# Patient Record
Sex: Female | Born: 1960 | Race: White | Hispanic: No | Marital: Married | Smoking: Former smoker
Health system: Southern US, Community
[De-identification: ages and names within clinical notes are randomized; demographics above are authoritative.]

## PROBLEM LIST (undated history)

## (undated) DIAGNOSIS — Z808 Family history of malignant neoplasm of other organs or systems: Secondary | ICD-10-CM

## (undated) DIAGNOSIS — M8580 Other specified disorders of bone density and structure, unspecified site: Secondary | ICD-10-CM

## (undated) DIAGNOSIS — K279 Peptic ulcer, site unspecified, unspecified as acute or chronic, without hemorrhage or perforation: Secondary | ICD-10-CM

## (undated) DIAGNOSIS — K219 Gastro-esophageal reflux disease without esophagitis: Secondary | ICD-10-CM

## (undated) DIAGNOSIS — Z720 Tobacco use: Secondary | ICD-10-CM

## (undated) DIAGNOSIS — Z87442 Personal history of urinary calculi: Secondary | ICD-10-CM

## (undated) DIAGNOSIS — T7840XA Allergy, unspecified, initial encounter: Secondary | ICD-10-CM

## (undated) DIAGNOSIS — H539 Unspecified visual disturbance: Secondary | ICD-10-CM

## (undated) HISTORY — DX: Allergy, unspecified, initial encounter: T78.40XA

## (undated) HISTORY — DX: Other specified disorders of bone density and structure, unspecified site: M85.80

## (undated) HISTORY — DX: Personal history of urinary calculi: Z87.442

## (undated) HISTORY — DX: Unspecified visual disturbance: H53.9

## (undated) HISTORY — DX: Family history of malignant neoplasm of other organs or systems: Z80.8

---

## 1994-07-26 DIAGNOSIS — Z87442 Personal history of urinary calculi: Secondary | ICD-10-CM

## 1994-07-26 HISTORY — DX: Personal history of urinary calculi: Z87.442

## 1996-07-26 HISTORY — PX: OTHER SURGICAL HISTORY: SHX169

## 2002-07-05 ENCOUNTER — Other Ambulatory Visit: Admission: RE | Admit: 2002-07-05 | Discharge: 2002-07-05 | Payer: Self-pay | Admitting: Internal Medicine

## 2002-09-12 ENCOUNTER — Ambulatory Visit (HOSPITAL_COMMUNITY): Admission: RE | Admit: 2002-09-12 | Discharge: 2002-09-12 | Payer: Self-pay | Admitting: Internal Medicine

## 2002-09-12 ENCOUNTER — Encounter (INDEPENDENT_AMBULATORY_CARE_PROVIDER_SITE_OTHER): Payer: Self-pay | Admitting: *Deleted

## 2002-09-12 LAB — HM COLONOSCOPY

## 2003-01-15 ENCOUNTER — Ambulatory Visit (HOSPITAL_COMMUNITY): Admission: RE | Admit: 2003-01-15 | Discharge: 2003-01-15 | Payer: Self-pay | Admitting: Obstetrics and Gynecology

## 2003-01-15 ENCOUNTER — Encounter: Payer: Self-pay | Admitting: Obstetrics and Gynecology

## 2004-04-25 ENCOUNTER — Emergency Department (HOSPITAL_COMMUNITY): Admission: EM | Admit: 2004-04-25 | Discharge: 2004-04-25 | Payer: Self-pay

## 2004-05-15 ENCOUNTER — Other Ambulatory Visit: Admission: RE | Admit: 2004-05-15 | Discharge: 2004-05-15 | Payer: Self-pay | Admitting: Obstetrics and Gynecology

## 2004-05-28 ENCOUNTER — Encounter: Admission: RE | Admit: 2004-05-28 | Discharge: 2004-05-28 | Payer: Self-pay | Admitting: Internal Medicine

## 2004-05-28 ENCOUNTER — Ambulatory Visit: Payer: Self-pay | Admitting: Internal Medicine

## 2004-07-01 ENCOUNTER — Ambulatory Visit: Payer: Self-pay | Admitting: Internal Medicine

## 2004-09-24 ENCOUNTER — Ambulatory Visit: Payer: Self-pay | Admitting: Internal Medicine

## 2004-10-07 ENCOUNTER — Ambulatory Visit: Payer: Self-pay | Admitting: Internal Medicine

## 2005-04-14 ENCOUNTER — Ambulatory Visit: Payer: Self-pay | Admitting: Internal Medicine

## 2005-06-09 ENCOUNTER — Ambulatory Visit: Payer: Self-pay | Admitting: Internal Medicine

## 2005-08-25 ENCOUNTER — Other Ambulatory Visit: Admission: RE | Admit: 2005-08-25 | Discharge: 2005-08-25 | Payer: Self-pay | Admitting: Obstetrics and Gynecology

## 2005-09-14 ENCOUNTER — Ambulatory Visit (HOSPITAL_COMMUNITY): Admission: RE | Admit: 2005-09-14 | Discharge: 2005-09-14 | Payer: Self-pay | Admitting: Obstetrics and Gynecology

## 2006-02-08 ENCOUNTER — Ambulatory Visit: Payer: Self-pay | Admitting: Internal Medicine

## 2006-02-22 ENCOUNTER — Ambulatory Visit: Payer: Self-pay | Admitting: Internal Medicine

## 2006-05-18 ENCOUNTER — Ambulatory Visit: Payer: Self-pay | Admitting: Internal Medicine

## 2006-09-02 ENCOUNTER — Ambulatory Visit: Payer: Self-pay | Admitting: Internal Medicine

## 2006-10-18 ENCOUNTER — Encounter: Admission: RE | Admit: 2006-10-18 | Discharge: 2006-10-18 | Payer: Self-pay | Admitting: Obstetrics and Gynecology

## 2006-12-02 ENCOUNTER — Ambulatory Visit: Payer: Self-pay | Admitting: Internal Medicine

## 2007-01-06 ENCOUNTER — Ambulatory Visit: Payer: Self-pay | Admitting: Internal Medicine

## 2007-01-09 DIAGNOSIS — IMO0002 Reserved for concepts with insufficient information to code with codable children: Secondary | ICD-10-CM | POA: Insufficient documentation

## 2007-01-09 DIAGNOSIS — J302 Other seasonal allergic rhinitis: Secondary | ICD-10-CM | POA: Insufficient documentation

## 2007-01-09 DIAGNOSIS — J3089 Other allergic rhinitis: Secondary | ICD-10-CM

## 2007-01-09 DIAGNOSIS — Z87442 Personal history of urinary calculi: Secondary | ICD-10-CM | POA: Insufficient documentation

## 2007-01-09 DIAGNOSIS — O1410 Severe pre-eclampsia, unspecified trimester: Secondary | ICD-10-CM | POA: Insufficient documentation

## 2007-01-18 ENCOUNTER — Ambulatory Visit: Payer: Self-pay | Admitting: Internal Medicine

## 2007-05-02 ENCOUNTER — Ambulatory Visit: Payer: Self-pay | Admitting: Internal Medicine

## 2007-07-04 ENCOUNTER — Ambulatory Visit: Payer: Self-pay | Admitting: Internal Medicine

## 2007-08-31 ENCOUNTER — Ambulatory Visit: Payer: Self-pay | Admitting: Internal Medicine

## 2007-10-04 ENCOUNTER — Ambulatory Visit: Payer: Self-pay | Admitting: Internal Medicine

## 2007-10-24 ENCOUNTER — Ambulatory Visit: Payer: Self-pay | Admitting: Internal Medicine

## 2007-10-27 ENCOUNTER — Telehealth: Payer: Self-pay | Admitting: Internal Medicine

## 2007-10-27 ENCOUNTER — Ambulatory Visit: Payer: Self-pay | Admitting: Internal Medicine

## 2007-12-13 ENCOUNTER — Encounter: Admission: RE | Admit: 2007-12-13 | Discharge: 2007-12-13 | Payer: Self-pay | Admitting: Obstetrics and Gynecology

## 2008-02-06 ENCOUNTER — Ambulatory Visit: Payer: Self-pay | Admitting: Internal Medicine

## 2008-02-14 ENCOUNTER — Encounter: Admission: RE | Admit: 2008-02-14 | Discharge: 2008-02-14 | Payer: Self-pay | Admitting: Obstetrics and Gynecology

## 2008-04-17 ENCOUNTER — Ambulatory Visit: Payer: Self-pay | Admitting: Family Medicine

## 2008-04-25 ENCOUNTER — Ambulatory Visit: Payer: Self-pay | Admitting: Internal Medicine

## 2008-04-25 DIAGNOSIS — I7300 Raynaud's syndrome without gangrene: Secondary | ICD-10-CM | POA: Insufficient documentation

## 2008-05-08 ENCOUNTER — Telehealth: Payer: Self-pay | Admitting: *Deleted

## 2008-06-04 ENCOUNTER — Telehealth: Payer: Self-pay | Admitting: Internal Medicine

## 2008-08-09 ENCOUNTER — Ambulatory Visit: Payer: Self-pay | Admitting: Internal Medicine

## 2008-08-09 LAB — CONVERTED CEMR LAB: Rapid Strep: NEGATIVE

## 2008-08-10 ENCOUNTER — Emergency Department (HOSPITAL_COMMUNITY): Admission: EM | Admit: 2008-08-10 | Discharge: 2008-08-10 | Payer: Self-pay | Admitting: Emergency Medicine

## 2008-08-19 ENCOUNTER — Ambulatory Visit: Payer: Self-pay | Admitting: Internal Medicine

## 2008-08-21 ENCOUNTER — Ambulatory Visit: Payer: Self-pay | Admitting: Internal Medicine

## 2008-08-23 ENCOUNTER — Encounter: Payer: Self-pay | Admitting: Internal Medicine

## 2008-12-30 ENCOUNTER — Ambulatory Visit: Payer: Self-pay | Admitting: Internal Medicine

## 2009-05-08 ENCOUNTER — Ambulatory Visit: Payer: Self-pay | Admitting: Internal Medicine

## 2009-07-07 ENCOUNTER — Ambulatory Visit: Payer: Self-pay | Admitting: Family Medicine

## 2009-07-07 LAB — CONVERTED CEMR LAB: Rapid Strep: POSITIVE

## 2009-09-11 ENCOUNTER — Ambulatory Visit: Payer: Self-pay | Admitting: Internal Medicine

## 2009-09-11 LAB — CONVERTED CEMR LAB: Hgb A1c MFr Bld: 5.5 % (ref 4.6–6.5)

## 2009-12-05 ENCOUNTER — Ambulatory Visit: Payer: Self-pay | Admitting: Family Medicine

## 2009-12-16 ENCOUNTER — Encounter: Payer: Self-pay | Admitting: Internal Medicine

## 2009-12-17 ENCOUNTER — Ambulatory Visit: Payer: Self-pay | Admitting: Internal Medicine

## 2009-12-18 ENCOUNTER — Encounter: Payer: Self-pay | Admitting: Internal Medicine

## 2009-12-20 ENCOUNTER — Ambulatory Visit: Payer: Self-pay | Admitting: Family Medicine

## 2009-12-23 LAB — CONVERTED CEMR LAB
ALT: 17 units/L (ref 0–35)
AST: 23 units/L (ref 0–37)
Albumin: 4.1 g/dL (ref 3.5–5.2)
Alkaline Phosphatase: 79 units/L (ref 39–117)
BUN: 14 mg/dL (ref 6–23)
Basophils Absolute: 0 10*3/uL (ref 0.0–0.1)
Basophils Relative: 0.6 % (ref 0.0–3.0)
Bilirubin, Direct: 0.1 mg/dL (ref 0.0–0.3)
CO2: 25 meq/L (ref 19–32)
Calcium: 9.5 mg/dL (ref 8.4–10.5)
Chloride: 104 meq/L (ref 96–112)
Creatinine, Ser: 0.6 mg/dL (ref 0.4–1.2)
Eosinophils Absolute: 0.1 10*3/uL (ref 0.0–0.7)
Eosinophils Relative: 1.1 % (ref 0.0–5.0)
GFR calc non Af Amer: 108.77 mL/min (ref >60)
Glucose, Bld: 95 mg/dL (ref 70–99)
HCT: 40.1 % (ref 36.0–46.0)
Hemoglobin: 13.8 g/dL (ref 12.0–15.0)
Lymphocytes Relative: 17.2 % (ref 12.0–46.0)
Lymphs Abs: 1.1 10*3/uL (ref 0.7–4.0)
MCHC: 34.5 g/dL (ref 30.0–36.0)
MCV: 90.6 fL (ref 78.0–100.0)
Monocytes Absolute: 0.5 10*3/uL (ref 0.1–1.0)
Monocytes Relative: 8.5 % (ref 3.0–12.0)
Neutro Abs: 4.6 10*3/uL (ref 1.4–7.7)
Neutrophils Relative %: 72.6 % (ref 43.0–77.0)
Platelets: 351 10*3/uL (ref 150.0–400.0)
Potassium: 4.1 meq/L (ref 3.5–5.1)
RBC: 4.43 M/uL (ref 3.87–5.11)
RDW: 13.8 % (ref 11.5–14.6)
Sed Rate: 16 mm/hr (ref 0–22)
Sodium: 139 meq/L (ref 135–145)
TSH: 2.4 microintl units/mL (ref 0.35–5.50)
Total Bilirubin: 0.5 mg/dL (ref 0.3–1.2)
Total Protein: 7.2 g/dL (ref 6.0–8.3)
WBC: 6.3 10*3/uL (ref 4.5–10.5)

## 2010-04-10 ENCOUNTER — Ambulatory Visit: Payer: Self-pay | Admitting: Family Medicine

## 2010-06-23 ENCOUNTER — Ambulatory Visit: Payer: Self-pay | Admitting: Internal Medicine

## 2010-07-03 ENCOUNTER — Telehealth: Payer: Self-pay | Admitting: Internal Medicine

## 2010-08-25 NOTE — Assessment & Plan Note (Signed)
Summary: DIARRHEA ok per ellen/njr   Vital Signs:  Patient profile:   50 year old female Weight:      149 pounds Temp:     98.2 degrees F Pulse rate:   80 / minute Pulse rhythm:   regular Resp:     14 per minute BP sitting:   110 / 76  (left arm)  Vitals Entered By: Gladis Riffle, RN (December 30, 2008 12:11 PM)  History of Present Illness: Recently in [place].  6 hours after drinking a cocunut conconction she started feeling ill developed diarrhea---has persisited now liquid diarrhea.  no abd pain, no fever, no chills, no rash other traveler's also have been ill  All other systems reviewed and were negative   Allergies: No Known Drug Allergies  Comments:  Nurse/Medical Assistant: c/o diarrhea since 12/26/08, afraid of dehydration--states from something she ate The patient's medications and allergies were reviewed with the patient and were updated in the Medication and Allergy Lists. Gladis Riffle, RN (December 30, 2008 12:13 PM)  Past History:  Past Medical History: Last updated: 01/09/2007 Allergic rhinitis Nephrolithiasis, hx of 1996 pregnancy induced toxemia "HELLP"  Past Surgical History: Last updated: 01/09/2007 cyst on vocal chord 1998  Social History: Last updated: 05/02/2007 Married Never Smoked  Risk Factors: Smoking Status: never (05/02/2007)  Physical Exam  General:  Well-developed,well-nourished,in no acute distress; alert,appropriate and cooperative throughout examination Head:  normocephalic and atraumatic.   Eyes:  pupils equal and pupils round.   Neck:  No deformities, masses, or tenderness noted. Chest Wall:  No deformities, masses, or tenderness noted. Lungs:  Normal respiratory effort, chest expands symmetrically. Lungs are clear to auscultation, no crackles or wheezes. Abdomen:  Bowel sounds positive,abdomen soft and non-tender without masses, organomegaly or hernias noted. Msk:  No deformity or scoliosis noted of thoracic or lumbar  spine.   Pulses:  R radial normal and L radial normal.   Neurologic:  cranial nerves II-XII intact and gait normal.     Impression & Recommendations:  Problem # 1:  DIARRHEA (ICD-787.91) likely traveller's diarrhea emperic antibiotic  Complete Medication List: 1)  Seasonale 0.15-0.03 Mg Tabs (Levonorgest-eth estrad 91-day) .... Take 1 tablet by mouth once a day 2)  Singulair 10 Mg Tabs (Montelukast sodium) .... Take 1 tablet by mouth once a day as needed 3)  Zyrtec Allergy 10 Mg Tabs (Cetirizine hcl) .... Take 1 tablet by mouth once a day 4)  Protonix 40 Mg Tbec (Pantoprazole sodium) .... Take 1 tablet by mouth once a day 5)  Lorazepam 0.5 Mg Tabs (Lorazepam) .... Take 1 tablet by mouth at bedtime as needed 6)  Elidel 1 % Crea (Pimecrolimus) .... Use two times a day as needed 7)  Levsin 0.125 Mg Tabs (Hyoscyamine sulfate) .... Take 1 tablet by mouth two times a day as needed abdominal pain 8)  Cipro 500 Mg Tabs (Ciprofloxacin hcl) .Marland Kitchen.. 1 by mouth 2 times daily for 5 days Prescriptions: CIPRO 500 MG TABS (CIPROFLOXACIN HCL) 1 by mouth 2 times daily for 5 days  #10 x 0   Entered and Authorized by:   Birdie Sons MD   Signed by:   Birdie Sons MD on 12/30/2008   Method used:   Electronically to        [facility]  [address] [id number]* (retail)       [address]       [place], [place]  [address]  Ph: [phone] or [phone]       Fax: [phone]   RxID:   [id number] METRONIDAZOLE 250 MG TABS (METRONIDAZOLE) Take 1 tablet by mouth three times a day for 5 days  #15 x 0   Entered and Authorized by:   Birdie Sons MD   Signed by:   Birdie Sons MD on 12/30/2008   Method used:   Electronically to        [facility]  [address] [id number]* (retail)       [address]       [place], [place]  [address]       Ph: [phone] or [phone]       Fax: [phone]   RxID:   [id number]

## 2010-08-25 NOTE — Assessment & Plan Note (Signed)
Summary: allergy flare up/njr   Vital Signs:  Patient Profile:   50 Years Old Female Weight:      165 pounds Temp:     98.2 degrees F oral Pulse rate:   92 / minute BP sitting:   142 / 84  (left arm) Cuff size:   regular  Vitals Entered By: Alfred Levins, CMA (April 17, 2008 2:14 PM)                 Chief Complaint:  allergies.  History of Present Illness: Every year during ragweed season she has several weeks of PND, sneezing, and ST. Usually gets relief  with a steroid shot. No fever or cough.    Current Allergies: No known allergies   Past Medical History:    Reviewed history from 01/09/2007 and no changes required:       Allergic rhinitis       Nephrolithiasis, hx of 1996       pregnancy induced toxemia       "HELLP"     Review of Systems  The patient denies anorexia, fever, weight loss, weight gain, vision loss, decreased hearing, hoarseness, chest pain, syncope, dyspnea on exertion, peripheral edema, prolonged cough, headaches, hemoptysis, abdominal pain, melena, hematochezia, severe indigestion/heartburn, hematuria, incontinence, genital sores, muscle weakness, suspicious skin lesions, transient blindness, difficulty walking, depression, unusual weight change, abnormal bleeding, enlarged lymph nodes, angioedema, breast masses, and testicular masses.     Physical Exam  General:     Well-developed,well-nourished,in no acute distress; alert,appropriate and cooperative throughout examination Head:     Normocephalic and atraumatic without obvious abnormalities. No apparent alopecia or balding. Eyes:     No corneal or conjunctival inflammation noted. EOMI. Perrla. Funduscopic exam benign, without hemorrhages, exudates or papilledema. Vision grossly normal. Ears:     External ear exam shows no significant lesions or deformities.  Otoscopic examination reveals clear canals, tympanic membranes are intact bilaterally without bulging, retraction, inflammation or  discharge. Hearing is grossly normal bilaterally. Nose:     External nasal examination shows no deformity or inflammation. Nasal mucosa are pink and moist without lesions or exudates. Mouth:     Oral mucosa and oropharynx without lesions or exudates.  Teeth in good repair. Neck:     No deformities, masses, or tenderness noted.    Impression & Recommendations:  Problem # 1:  ALLERGIC RHINITIS (ICD-477.9)  Her updated medication list for this problem includes:    Zyrtec Allergy 10 Mg Tabs (Cetirizine hcl) .Marland Kitchen... Take 1 tablet by mouth once a day  Orders: Depo- Medrol 80mg  (J1040) Admin of Therapeutic Inj  intramuscular or subcutaneous ([address])   Complete Medication List: 1)  Seasonale 0.15-0.03 Mg Tabs (Levonorgest-eth estrad 91-day) .... Take 1 tablet by mouth once a day 2)  Singulair 10 Mg Tabs (Montelukast sodium) .... Take 1 tablet by mouth once a day as needed 3)  Zyrtec Allergy 10 Mg Tabs (Cetirizine hcl) .... Take 1 tablet by mouth once a day 4)  Protonix 40 Mg Tbec (Pantoprazole sodium) .... Take 1 tablet by mouth once a day 5)  Lorazepam 0.5 Mg Tabs (Lorazepam) .... Take 1 tablet by mouth at bedtime as needed 6)  Elidel 1 % Crea (Pimecrolimus) .... Use two times a day as needed 7)  Diclofenac Sodium 75 Mg Tbec (Diclofenac sodium) .... Take 1 tablet by mouth two times a day   Patient Instructions: 1)  Please schedule a follow-up appointment as needed.   ]  Medication Administration  Injection # 1:    Medication: Depo- Medrol 80mg     Diagnosis: ALLERGIC RHINITIS (ICD-477.9)    Route: IM    Site: LUOQ gluteus    Exp Date: 9/10    Lot #: [id number]    Mfr: Sicor    Comments: 120mg     Patient tolerated injection without complications    Given by: Alfred Levins, CMA (April 17, 2008 4:05 PM)  Orders Added: 1)  Est. Patient Level III [[address]] 2)  Depo- Medrol 80mg  [J1040] 3)  Admin of Therapeutic Inj  intramuscular or subcutaneous [[address]]

## 2010-08-25 NOTE — Consult Note (Signed)
Summary: [facility]  [facility]   Imported By: Maryln Gottron 01/07/2010 15:34:10  _____________________________________________________________________  External Attachment:    Type:   Image     Comment:   External Document

## 2010-08-25 NOTE — Assessment & Plan Note (Signed)
Summary: thristy alot/fatigue/njr   Vital Signs:  Patient profile:   50 year old female Weight:      163 pounds Temp:     98.7 degrees F oral BP sitting:   138 / 90  (right arm) Cuff size:   regular  Vitals Entered By: Duard Brady LPN (September 11, 2009 12:49 PM) CC: c/o inrease thirst, faituge and dry mouth    CC:  c/o inrease thirst and faituge and dry mouth .  History of Present Illness: 50 year old patient who states she has a history of hypoglycemia, but has had random blood sugars checked infrequently, but has apparently had readings in excess of 300.  She presents complaining of increasing fatigue thirst polyuria and dry mouth.  She denies any blurred vision or weight loss.she does have a family history of diabetes  Preventive Screening-Counseling & Management  Alcohol-Tobacco     Smoking Status: quit  Allergies: No Known Drug Allergies  Past History:  Past Medical History: Reviewed history from 01/09/2007 and no changes required. Allergic rhinitis Nephrolithiasis, hx of 1996 pregnancy induced toxemia "HELLP"  Social History: Smoking Status:  quit  Review of Systems       The patient complains of muscle weakness.  The patient denies anorexia, fever, weight loss, weight gain, vision loss, decreased hearing, hoarseness, chest pain, syncope, dyspnea on exertion, peripheral edema, prolonged cough, headaches, hemoptysis, abdominal pain, melena, hematochezia, severe indigestion/heartburn, hematuria, incontinence, genital sores, suspicious skin lesions, transient blindness, difficulty walking, depression, unusual weight change, abnormal bleeding, enlarged lymph nodes, angioedema, and breast masses.    Physical Exam  General:  Well-developed,well-nourished,in no acute distress; alert,appropriate and cooperative throughout examination Head:  Normocephalic and atraumatic without obvious abnormalities. No apparent alopecia or balding. Eyes:  No corneal or  conjunctival inflammation noted. EOMI. Perrla. Funduscopic exam benign, without hemorrhages, exudates or papilledema. Vision grossly normal. Mouth:  Oral mucosa and oropharynx without lesions or exudates.  Teeth in good repair. Neck:  No deformities, masses, or tenderness noted. Lungs:  Normal respiratory effort, chest expands symmetrically. Lungs are clear to auscultation, no crackles or wheezes. Heart:  Normal rate and regular rhythm. S1 and S2 normal without gallop, murmur, click, rub or other extra sounds.   Impression & Recommendations:  Problem # 1:  POLYURIA (ZOX-096.04)  Complete Medication List: 1)  Seasonale 0.15-0.03 Mg Tabs (Levonorgest-eth estrad 91-day) .... Take 1 tablet by mouth once a day 2)  Singulair 10 Mg Tabs (Montelukast sodium) .... Take 1 tablet by mouth once a day as needed 3)  Zyrtec Allergy 10 Mg Tabs (Cetirizine hcl) .... Take 1 tablet by mouth once a day 4)  Protonix 40 Mg Tbec (Pantoprazole sodium) .... Take 1 tablet by mouth once a day 5)  Lorazepam 0.5 Mg Tabs (Lorazepam) .... Take 1 tablet by mouth at bedtime as needed 6)  Elidel 1 % Crea (Pimecrolimus) .... Use two times a day as needed 7)  Levsin 0.125 Mg Tabs (Hyoscyamine sulfate) .... Take 1 tablet by mouth two times a day as needed abdominal pain 8)  Amoxicillin 875 Mg Tabs (Amoxicillin) .... One by mouth two times a day for 10 days 9)  Exactech Test Strp (Glucose blood) .... As directed  Other Orders: Venipuncture (54098) TLB-A1C / Hgb A1C (Glycohemoglobin) (83036-A1C)  Patient Instructions: 1)  Please schedule a follow-up appointment as needed. Prescriptions: EXACTECH TEST  STRP (GLUCOSE BLOOD) as directed  #30 x 6   Entered and Authorized by:   Gordy Savers  MD  Signed by:   Gordy Savers  MD on 09/11/2009   Method used:   Print then Give to Patient   RxID:   [id number]

## 2010-08-25 NOTE — Progress Notes (Signed)
  Phone Note Call from Patient Call back at Home Phone [phone]   Caller: Patient Call For: Birdie Sons MD Summary of Call: [facility] [address] Pt has thrush and yeast infection and wants Diflucan. Initial call taken by: [facility] CMA AAMA,  July 03, 2010 4:06 PM  Follow-up for Phone Call        see rx  Follow-up by: Birdie Sons MD,  July 03, 2010 4:41 PM    New/Updated Medications: DIFLUCAN 150 MG TABS (FLUCONAZOLE) once daily Prescriptions: DIFLUCAN 150 MG TABS (FLUCONAZOLE) once daily  #1 x 0   Entered and Authorized by:   Birdie Sons MD   Signed by:   Birdie Sons MD on 07/03/2010   Method used:   Electronically to        [facility]  [address] [id number]* (retail)       [address]       [place], [place]  [address]       Ph: [phone] or [phone]       Fax: [phone]   RxID:   [id number]  Notified pt.

## 2010-08-25 NOTE — Assessment & Plan Note (Signed)
Summary: spots  on skin/njr   Vital Signs:  Patient profile:   50 year old female O2 Sat:      98 % on Room air Temp:     98.2 degrees F oral BP sitting:   130 / 84  (left arm) Cuff size:   regular  Vitals Entered By: Sid Falcon LPN (Dec 05, 2009 1:55 PM)  O2 Flow:  Room air CC: spots on skin, raspy voice   History of Present Illness: Patient here for the following items.  History of seasonal allergies. Takes Zyrtec regularly. Significant postnasal drip symptoms. Previous intolerance to Singulair. Recent laryngitis symptoms. Has responded well to steroids in the past and is requesting the same today. No infectious symptoms such as fever or purulent nasal discharge.  New problem of pruritic nummular scaly rash right lower leg area and prior history of facial eczema treated with Elidel. Has tried over-the-counter topical hydrocortisone without relief.  Recent increased anxiety symptoms. Diagnosis of melanoma recently in daughter. Patient requesting refill lorazepam.  Patient also brings up recent noted asymmetry left foot compared to right on the medial aspect. She is having increasing pain with ambulation. No injury. No recent change of shoe wear.  Allergies: No Known Drug Allergies  Past History:  Past Medical History: Last updated: 01/09/2007 Allergic rhinitis Nephrolithiasis, hx of 1996 pregnancy induced toxemia "HELLP"  Past Surgical History: Last updated: 01/09/2007 cyst on vocal chord 1998  Social History: Last updated: 05/02/2007 Married Never Smoked  Review of Systems  The patient denies anorexia, fever, weight loss, chest pain, syncope, dyspnea on exertion, prolonged cough, headaches, and hemoptysis.    Physical Exam  General:  Well-developed,well-nourished,in no acute distress; alert,appropriate and cooperative throughout examination Ears:  External ear exam shows no significant lesions or deformities.  Otoscopic examination reveals clear canals,  tympanic membranes are intact bilaterally without bulging, retraction, inflammation or discharge. Hearing is grossly normal bilaterally. Nose:  External nasal examination shows no deformity or inflammation. Nasal mucosa are pink and moist without lesions or exudates. Mouth:  Oral mucosa and oropharynx without lesions or exudates.  Teeth in good repair. Neck:  No deformities, masses, or tenderness noted. Lungs:  Normal respiratory effort, chest expands symmetrically. Lungs are clear to auscultation, no crackles or wheezes. Heart:  Normal rate and regular rhythm. S1 and S2 normal without gallop, murmur, click, rub or other extra sounds. Extremities:  left foot reveals bony prominence medially near navicular and asymmetric compared to the right side. Normal range of motion left ankle. No significant tenderness to palpation. No overlying skin changes. Skin:  nummular rash right lower leg approximately 2 cm diameter. Slightly scaly. Nonpainful.   Impression & Recommendations:  Problem # 1:  ALLERGIC RHINITIS (ICD-477.9)  Her updated medication list for this problem includes:    Zyrtec Allergy 10 Mg Tabs (Cetirizine hcl) .Marland Kitchen... 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 Her updated medication list for this problem includes:    Zyrtec Allergy 10 Mg Tabs (Cetirizine hcl) .Marland Kitchen... Take 1 tablet by mouth once a day    Triamcinolone Acetonide 0.1 % Crea (Triamcinolone acetonide) .Marland Kitchen... Apply to affected rash two times a day as needed.  Problem # 3:  FOOT PAIN (ICD-729.5)  patient has bony prominence and asymmetry left foot compared to right. Discussed options. Schedule followup with orthopedist.  Orders: Orthopedic Surgeon Referral (Ortho Surgeon)  Problem # 4:  ANXIETY (ICD-300.00)  Her updated medication  list for this problem includes:    Lorazepam 0.5 Mg Tabs (Lorazepam) .Marland Kitchen... Take 1 tablet by mouth at bedtime as needed  Complete  Medication List: 1)  Seasonale 0.15-0.03 Mg Tabs (Levonorgest-eth estrad 91-day) .... Take 1 tablet by mouth once a day 2)  Singulair 10 Mg Tabs (Montelukast sodium) .... Take 1 tablet by mouth once a day as needed 3)  Zyrtec Allergy 10 Mg Tabs (Cetirizine hcl) .... Take 1 tablet by mouth once a day 4)  Protonix 40 Mg Tbec (Pantoprazole sodium) .... Take 1 tablet by mouth once a day 5)  Lorazepam 0.5 Mg Tabs (Lorazepam) .... Take 1 tablet by mouth at bedtime as needed 6)  Elidel 1 % Crea (Pimecrolimus) .... Use two times a day as needed 7)  Levsin 0.125 Mg Tabs (Hyoscyamine sulfate) .... Take 1 tablet by mouth two times a day as needed abdominal pain 8)  Amoxicillin 875 Mg Tabs (Amoxicillin) .... One by mouth two times a day for 10 days 9)  Exactech Test Strp (Glucose blood) .... As directed 10)  Triamcinolone Acetonide 0.1 % Crea (Triamcinolone acetonide) .... Apply to affected rash two times a day as needed.  Patient Instructions: 1)  Please schedule a follow-up appointment as needed .  Prescriptions: TRIAMCINOLONE ACETONIDE 0.1 % CREA (TRIAMCINOLONE ACETONIDE) apply to affected rash two times a day as needed.  #30 gm x 2   Entered and Authorized by:   Evelena Peat MD   Signed by:   Evelena Peat MD on 12/05/2009   Method used:   Electronically to        [facility]  [address] [id number]* (retail)       [address]       [place], [place]  [address]       Ph: [phone] or [phone]       Fax: [phone]   RxID:   [id number] ELIDEL 1 %  CREA (PIMECROLIMUS) use two times a day as needed  #5grams x 2   Entered and Authorized by:   Evelena Peat MD   Signed by:   Evelena Peat MD on 12/05/2009   Method used:   Electronically to        [facility]  [address] [facility]* (retail)       [address]       [place], [place]  [address]       Ph: [phone] or [phone]       Fax: [phone]   RxID:   [id number] LORAZEPAM 0.5  MG  TABS (LORAZEPAM) Take 1 tablet by mouth at bedtime as needed  #30 x 0   Entered and Authorized by:   Evelena Peat MD   Signed by:   Evelena Peat MD on 12/05/2009   Method used:   Print then Give to Patient   RxID:   [id number]

## 2010-08-25 NOTE — Assessment & Plan Note (Signed)
Summary: ALLERGIES FLARE UP/NJR   Vital Signs:  Patient profile:   50 year old female Weight:      167 pounds Temp:     97.7 degrees F oral BP sitting:   140 / 82  (left arm) Cuff size:   regular  Vitals Entered By: Sid Falcon LPN (April 10, 2010 1:57 PM) CC: allergies flare up   History of Present Illness: patient seen same day appointment. Worsening allergies. Allergy to several pollens mostly spring and fall. Takes Zyrtec regularly. Previous side effects with Singulair and nasal steroids. Usually gets steroid injection about twice yearly. Has never tried nasal antihistamines. Frequent rhinorrhea. Occasional cough. Intermittent hoarseness.  Allergies: No Known Drug Allergies  Past History:  Past Medical History: Last updated: 01/09/2007 Allergic rhinitis Nephrolithiasis, hx of 1996 pregnancy induced toxemia "HELLP"  Review of Systems  The patient denies fever, decreased hearing, prolonged cough, and hemoptysis.    Physical Exam  General:  Well-developed,well-nourished,in no acute distress; alert,appropriate and cooperative throughout examination Eyes:  No corneal or conjunctival inflammation noted. EOMI. Perrla. Funduscopic exam benign, without hemorrhages, exudates or papilledema. Vision grossly normal. Nose:  External nasal examination shows no deformity or inflammation. Nasal mucosa are pink and moist without lesions or exudates. Mouth:  Oral mucosa and oropharynx without lesions or exudates.  Teeth in good repair. Neck:  No deformities, masses, or tenderness noted. Lungs:  Normal respiratory effort, chest expands symmetrically. Lungs are clear to auscultation, no crackles or wheezes. Heart:  Normal rate and regular rhythm. S1 and S2 normal without gallop, murmur, click, rub or other extra sounds.   Impression & Recommendations:  Problem # 1:  ALLERGIC RHINITIS (ICD-477.9) Assessment Deteriorated trial of nasal antihistamine and Depo-Medrol 80 mg  given Her updated medication list for this problem includes:    Zyrtec Allergy 10 Mg Tabs (Cetirizine hcl) .Marland Kitchen... Take 1 tablet by mouth once a day    Astelin 137 Mcg/spray Soln (Azelastine hcl) .Marland Kitchen... 1-2 sprays per nostril two times a day  as needed allergies  Complete Medication List: 1)  Seasonale 0.15-0.03 Mg Tabs (Levonorgest-eth estrad 91-day) .... Take 1 tablet by mouth once a day 2)  Zyrtec Allergy 10 Mg Tabs (Cetirizine hcl) .... Take 1 tablet by mouth once a day 3)  Protonix 40 Mg Tbec (Pantoprazole sodium) .... Take 1 tablet by mouth once a day 4)  Lorazepam 0.5 Mg Tabs (Lorazepam) .... Take 1 tablet by mouth at bedtime as needed 5)  Elidel 1 % Crea (Pimecrolimus) .... Use two times a day as needed 6)  Levsin 0.125 Mg Tabs (Hyoscyamine sulfate) .... Take 1 tablet by mouth two times a day as needed abdominal pain 7)  Triamcinolone Acetonide 0.1 % Crea (Triamcinolone acetonide) .... Apply to affected rash two times a day as needed. 8)  Azithromycin 250 Mg Tabs (Azithromycin) .... 2 tab by mouth x 1 then 1 tab by mouth daily 9)  Tessalon Perles 100 Mg Caps (Benzonatate) .... 2 tab by mouth three times a day as needed cough 10)  Astelin 137 Mcg/spray Soln (Azelastine hcl) .Marland Kitchen.. 1-2 sprays per nostril two times a day  as needed allergies  Other Orders: Depo- Medrol 80mg  (J1040) Admin of Therapeutic Inj  intramuscular or subcutaneous ([address])  Patient Instructions: 1)  Please schedule a follow-up appointment as needed .  Prescriptions: LORAZEPAM 0.5 MG  TABS (LORAZEPAM) Take 1 tablet by mouth at bedtime as needed  #30 x 1   Entered and Authorized by:   Evelena Peat  MD   Signed by:   Evelena Peat MD on 04/10/2010   Method used:   Print then Give to Patient   RxID:   [id number] ASTELIN 137 MCG/SPRAY SOLN (AZELASTINE HCL) 1-2 sprays per nostril two times a day  as needed allergies  #1 x 11   Entered and Authorized by:   Evelena Peat MD   Signed by:   Evelena Peat MD on  04/10/2010   Method used:   Print then Give to Patient   RxID:   [id number]    Medication Administration  Injection # 1:    Medication: Depo- Medrol 80mg     Diagnosis: COUGH (ICD-786.2)    Route: IM    Site: RUOQ gluteus    Exp Date: 10/24/2012    Lot #: [id number]    Mfr: Pharmacia    Patient tolerated injection without complications    Given by: Sid Falcon LPN (April 10, 2010 3:11 PM)  Orders Added: 1)  Depo- Medrol 80mg  [J1040] 2)  Admin of Therapeutic Inj  intramuscular or subcutaneous [96[address]] 3)  Est. Patient Level III [29528]

## 2010-08-25 NOTE — Assessment & Plan Note (Signed)
Summary: shortness of breath//ccm   Vital Signs:  Patient profile:   50 year old female Temp:     98 degrees F oral Pulse rate:   96 / minute Pulse rhythm:   regular Resp:     14 per minute BP sitting:   128 / 84  (left arm) Cuff size:   regular  Vitals Entered By: Gladis Riffle, RN (Dec 17, 2009 9:07 AM) CC: c/o fatigue x 2-3 weeks, SOB began yesterday Is Patient Diabetic? No   CC:  c/o fatigue x 2-3 weeks and SOB began yesterday.  History of Present Illness: "i feel lousy" she admits to a lot of stress around dtr's diagnosis of melanoma  she has noted change in periods (supposed to have periods q 3 months---based on BCPs). 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 Take 1 Tablet By Mouth Once A Day 2)  Zyrtec Allergy 10 Mg Tabs (Cetirizine Hcl) .... Take 1 Tablet By Mouth Once A Day 3)  Protonix 40 Mg  Tbec (Pantoprazole Sodium) .... Take 1 Tablet By Mouth Once A Day 4)  Lorazepam 0.5 Mg  Tabs (Lorazepam) .... Take 1 Tablet By Mouth At Bedtime As Needed 5)  Elidel 1 %  Crea (Pimecrolimus) .... Use Two Times A Day As Needed 6)  Levsin 0.125 Mg Tabs (Hyoscyamine Sulfate) .... Take 1 Tablet By Mouth  Two Times A Day As Needed Abdominal Pain 7)  Triamcinolone Acetonide 0.1 % Crea (Triamcinolone Acetonide) .... Apply To Affected Rash Two Times A Day As Needed.  Allergies (verified): No Known Drug Allergies  Family History: daughter with Melanoma (2011)  Physical Exam  General:  alert and well-developed.   Head:  normocephalic and atraumatic.   Eyes:  pupils equal and pupils round.   Ears:  R ear normal and L ear normal.   Mouth:  no gingival abnormalities, no dental plaque, and pharynx pink and moist.   Neck:  No deformities, masses, or tenderness noted. Lungs:  Normal respiratory effort, chest expands symmetrically. Lungs are clear to auscultation, no crackles or wheezes. Heart:  normal rate and regular rhythm.   Abdomen:  soft and non-tender.   Msk:  No deformity or scoliosis noted of thoracic or lumbar spine.   Neurologic:  cranial nerves II-XII intact and gait normal.   Skin:  turgor normal and color normal.   Psych:  normally interactive and good eye contact.     Impression & Recommendations:  Problem # 1:  FATIGUE (ICD-780.79) needs further eval she has a lot of complaints---all may be related to stress/anxiety check labs and then make further decisions Orders: TLB-BMP (Basic Metabolic Panel-BMET) (80048-METABOL) TLB-Hepatic/Liver Function Pnl (80076-HEPATIC) TLB-CBC Platelet - w/Differential (85025-CBCD)  TLB-TSH (Thyroid Stimulating Hormone) (84443-TSH) TLB-Sedimentation Rate (ESR) (85652-ESR)  Complete Medication List: 1)  Seasonale 0.15-0.03 Mg Tabs (Levonorgest-eth estrad 91-day) .... Take 1 tablet by mouth once a day 2)  Zyrtec Allergy 10 Mg Tabs (Cetirizine hcl) .... Take 1 tablet by mouth once a day 3)  Protonix 40 Mg Tbec (Pantoprazole sodium) .... Take 1 tablet by mouth once a day 4)  Lorazepam 0.5 Mg Tabs (Lorazepam) .... Take 1 tablet by mouth at bedtime as needed 5)  Elidel 1 % Crea (Pimecrolimus) .... Use two times a day as needed 6)  Levsin 0.125 Mg  Tabs (Hyoscyamine sulfate) .... Take 1 tablet by mouth two times a day as needed abdominal pain 7)  Triamcinolone Acetonide 0.1 % Crea (Triamcinolone acetonide) .... Apply to affected rash two times a day as needed.  Appended Document: shortness of breath//ccm  Laboratory Results   Urine Tests    Routine Urinalysis   Color: yellow Appearance: Clear Glucose: negative   (Normal Range: Negative) Bilirubin: negative   (Normal Range: Negative) Ketone: negative   (Normal Range: Negative) Spec. Gravity: <1.005   (Normal Range: 1.003-1.035) Blood: 1+   (Normal Range: Negative) pH: 6.5   (Normal Range: 5.0-8.0) Protein: negative   (Normal Range: Negative) Urobilinogen: 0.2   (Normal Range: 0-1) Nitrite: negative   (Normal Range: Negative) Leukocyte Esterace: negative   (Normal Range: Negative)    Comments: Rita Ohara  Dec 17, 2009 1:02 PM      Appended Document: shortness of breath//ccm add culture to UA  Appended Document: shortness of breath//ccm lab will do.  order in process.

## 2010-08-25 NOTE — Assessment & Plan Note (Signed)
Summary: sinuses/ccm   Vital Signs:  Patient profile:   50 year old female Height:      69 inches Weight:      163 pounds BMI:     24.16 Temp:     98.7 degrees F oral BP sitting:   116 / 90  (left arm) Cuff size:   regular  Vitals Entered By: Alfred Levins, CMA (June 23, 2010 12:24 PM) CC: h/a every day for 3wks   CC:  h/a every day for 3wks.  History of Present Illness: 3 week hx of sinus headaches---tried otc meds now ten days of purulent discharge Has hx of recurrent sinusitis.  Headaches persist Has PNDr and a malodorous sensation in her nose Uses some Afrin for nocturnal relief.   Current Medications (verified): 1)  Zyrtec Allergy 10 Mg Tabs (Cetirizine Hcl) .... Take 1 Tablet By Mouth Once A Day 2)  Protonix 40 Mg  Tbec (Pantoprazole Sodium) .... Take 1 Tablet By Mouth Once A Day 3)  Lorazepam 0.5 Mg  Tabs (Lorazepam) .... Take 1 Tablet By Mouth At Bedtime As Needed 4)  Elidel 1 %  Crea (Pimecrolimus) .... Use Two Times A Day As Needed 5)  Triamcinolone Acetonide 0.1 % Crea (Triamcinolone Acetonide) .... Apply To Affected Rash Two Times A Day As Needed. 6)  Astelin 137 Mcg/spray Soln (Azelastine Hcl) .Marland Kitchen.. 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 HEENT exam atraumatic, normocephalic symmetric her muscles are intact. Neck is supple. Oral Chalmers Guest is moist without erythema. Nasal mucosa has mild erythema.  Chest clear to auscultation. Cardiac exam S1-S2 are regular.   Impression & Recommendations:  Problem # 1:  COUGH (ICD-786.2) given her new symptoms with headache and sinus congestion and the duration I think it's best to treat with an antibiotic. I've told her that I can't guarantee that this will work. She has had evidence of sinusitis in the past. Side effects of antibiotics discussed. She'll call me if symptoms persist.  Complete Medication List: 1)  Zyrtec Allergy 10 Mg Tabs (Cetirizine hcl) .... Take 1 tablet by mouth once a day 2)  Protonix 40 Mg Tbec (Pantoprazole sodium) .... Take 1 tablet by mouth once a day 3)  Lorazepam 0.5 Mg Tabs (Lorazepam) .... Take 1 tablet by mouth at bedtime as needed 4)  Elidel 1 % Crea (Pimecrolimus) .... Use two times a day as needed 5)  Triamcinolone Acetonide 0.1 % Crea (Triamcinolone acetonide) .... Apply to affected rash two times a day as needed. 6)  Astelin 137 Mcg/spray Soln (Azelastine hcl) .Marland Kitchen.. 1-2 sprays per nostril two times a day  as needed allergies 7)  Doxycycline Hyclate 100 Mg Caps (Doxycycline hyclate) .... Take 1 tab twice a day Prescriptions: DOXYCYCLINE HYCLATE 100 MG CAPS (DOXYCYCLINE HYCLATE) Take 1 tab twice a day  #20 x 0   Entered and Authorized by:   Birdie Sons MD   Signed by:   Birdie Sons MD on 06/23/2010   Method used:   Electronically to  [facility]  [address] # [id number]* (retail)       [address]       [place], [place]  [address]       Ph: [phone] or [phone]       Fax: [phone]   RxID:   [id number]    Orders Added: 1)  Est. Patient Level III [[address]]

## 2010-08-25 NOTE — Assessment & Plan Note (Signed)
Summary: SORE THROAT/MHF   Vital Signs:  Patient Profile:   50 Years Old Female Temp:     98.5 degrees F Pulse rate:   80 / minute BP sitting:   118 / 84  (left arm)  Vitals Entered By: Gladis Riffle, RN (July 04, 2007 11:46 AM)                 Chief Complaint:  c/o laryngitis X 5 days and denies other symptoms.  History of Present Illness: Pt. complains of 5 day history of progressive symptoms of her chronic laryngitis seconday to allergies and GERD.  She has been losing her voice more each day.  She has also been experiencing increased acid reflux symptoms and  nasal congestion and itchy eyes with this.    Current Allergies (reviewed today): No known allergies       Physical Exam  General:     Well-developed,well-nourished,in no acute distress; alert,appropriate and cooperative throughout examination Head:     Normocephalic and atraumatic without obvious abnormalities. No apparent alopecia or balding. Eyes:     sclera and conjunctiva are clear, mild epiphorea. Ears:     External ear exam shows no significant lesions or deformities.  Otoscopic examination reveals clear canals, tympanic membranes are intact bilaterally without bulging, retraction, inflammation or discharge. Hearing is grossly normal bilaterally. Nose:     mucosal erythema and mucosal friability.   Mouth:     Oral mucosa and oropharynx without lesions or exudates.  Teeth in good repair. Lungs:     Normal respiratory effort, chest expands symmetrically. Lungs are clear to auscultation, no crackles or wheezes. Abdomen:     there is slight epigastric tenderness, otherwise abdomen is soft, normal bowel sounds, no distention, no masses, no guarding, and no rigidity.      Impression & Recommendations:  Problem # 1:  CHRONIC LARYNGITIS (ICD-476.0) It appears that the patients laryngitis is due to an increase in one or both of her allergies or her GERD.  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  Have also recommended that she take an OTC H2-blocker at night in addition to her protonix. Orders: Depo- Medrol 80mg  (J1040) Admin of Therapeutic Inj  intramuscular or subcutaneous (65784)   Complete Medication List: 1)  Seasonale 0.15-0.03 Mg Tabs (Levonorgest-eth estrad 91-day) .... Take 1 tablet by mouth once a day 2)  Singulair 10 Mg Tabs (Montelukast sodium) .... Take 1 tablet by mouth once a day 3)  Zyrtec Allergy 10 Mg Tabs (Cetirizine hcl) .... Take 1 tablet by mouth once a day 4)  Protonix 40 Mg Tbec (Pantoprazole sodium) .... Take 1 tablet by mouth once a day 5)  Lorazepam 0.5 Mg Tabs (Lorazepam) .... Take 1 tablet by mouth at bedtime as needed  Other Orders: Influenza Vaccine NON MCR (69629)     ]  Medication Administration  Injection # 1:    Medication: Depo- Medrol 80mg     Diagnosis: CHRONIC LARYNGITIS (ICD-476.0)    Route: IM    Site: LUOQ gluteus    Exp Date: 08/26/2008    Lot #: [id number]    Mfr: sicor  Orders Added: 1)  Depo- Medrol 80mg  [J1040] 2)  Admin of Therapeutic Inj  intramuscular or subcutaneous [90772] 3)  Influenza Vaccine NON MCR [00028] 4)  Est. Patient Level III [40102]    Influenza Vaccine    Vaccine Type: Fluvax Non-MCR  Given by: Gladis Riffle, RN  Flu Vaccine Consent Questions    Do you have a history of severe allergic reactions to this vaccine? no    Any prior history of allergic reactions to egg and/or gelatin? no    Do you have a sensitivity to the preservative Thimersol? no    Do you have a past history of Guillan-Barre Syndrome? no    Do you currently have an acute febrile illness? no    Have you ever had a severe reaction to latex? no    Vaccine information given and explained to patient? yes    Are you currently pregnant? no lot [id number], exp June 30,2009, [employer], left deltoid IM, 0.5 cc  ..................................................................Marland KitchenGladis Riffle, RN  July 04, 2007 12:27 PM

## 2010-08-25 NOTE — Assessment & Plan Note (Signed)
Summary: BAD COUGH--YELLOW STUFF//VGJ   Vital Signs:  Patient profile:   50 year old female Weight:      154 pounds O2 Sat:      99 % on Room air Temp:     98.5 degrees F oral Pulse rate:   90 / minute BP sitting:   124 / 80  (left arm)  Vitals Entered By: Doristine Devoid (Dec 20, 2009 11:30 AM)  O2 Flow:  Room air CC: cough and congestion, Depression   History of Present Illness: Seen by Dr. Cato Mulligan 3 days ago for multiple issues.  HAd coughx 1 week.  Hx of allergies and steroid shots.  On prednisone for ruptured tendon..low dose taper. Some heartburn issues.   No improvement in cough, gradually worsening.  Dry cough. Chest tightness, SOB.  Occ yellow production.  Has inhaler..tired recently..minimal improvement with chest tightness.  IN past for similar advair has made it worse.    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 Take 1 Tablet By Mouth Once A Day 2)  Zyrtec Allergy 10 Mg Tabs (Cetirizine Hcl) .... Take 1 Tablet By Mouth Once A Day 3)  Protonix 40 Mg  Tbec (Pantoprazole Sodium) .... Take 1 Tablet By Mouth Once A Day 4)  Lorazepam 0.5 Mg  Tabs (Lorazepam) .... Take 1 Tablet By Mouth At Bedtime As Needed 5)  Elidel 1 %  Crea (Pimecrolimus) .... Use Two Times A Day As Needed 6)  Levsin 0.125 Mg Tabs (Hyoscyamine Sulfate) .... Take 1 Tablet By Mouth Two Times A Day As Needed Abdominal Pain 7)  Triamcinolone Acetonide 0.1 % Crea (Triamcinolone Acetonide) .... Apply To Affected Rash Two Times A Day As Needed. 8)  Azithromycin 250 Mg Tabs (Azithromycin) .... 2 Tab By Mouth X 1 Then 1 Tab By  Mouth Daily 9)  Tessalon Perles 100 Mg Caps (Benzonatate) .... 2 Tab By Mouth Three Times A Day As Needed Cough  Allergies (verified): No Known Drug Allergies  Past History:  Past medical, surgical, family and social histories (including risk factors) reviewed, and no changes noted (except as noted below).  Past Medical History: Reviewed history from 01/09/2007 and no changes required. Allergic rhinitis Nephrolithiasis, hx of 1996 pregnancy induced toxemia "HELLP"  Past Surgical History: Reviewed history from 01/09/2007 and no changes required. cyst on vocal chord 1998  Family History: Reviewed history from 12/17/2009 and no changes required. daughter with Melanoma (2011)  Social History: Reviewed history from 05/02/2007 and no changes required. Married Never Smoked  Review of Systems General:  Denies fatigue and fever. ENT:  Complains of nasal congestion; denies ear discharge, earache, sinus pressure, and sore throat. CV:  Denies chest pain or discomfort. Resp:  Complains of shortness of breath. GI:  Complains of indigestion; denies abdominal pain; reflux since starting prednisone.  Physical Exam  General:  Well-developed,well-nourished,in no acute distress; alert,appropriate and cooperative throughout examination Head:  mild left maxiallry ttp Eyes:  No corneal or conjunctival inflammation noted. EOMI. Perrla. Funduscopic exam benign, without hemorrhages, exudates or papilledema. Vision grossly normal. Ears:  External ear exam shows no significant lesions or deformities.  Otoscopic examination reveals clear canals,  tympanic membranes are intact bilaterally without bulging, retraction, inflammation or discharge. Hearing is grossly normal bilaterally. Nose:  External nasal examination shows no deformity or inflammation. Nasal mucosa are pink and moist without lesions or exudates. Mouth:  Oral mucosa and oropharynx without lesions or exudates.  Teeth in good repair. Neck:   no carotid bruit or thyromegaly no cervical or supraclavicular lymphadenopathy  Lungs:  Normal respiratory effort, chest expands symmetrically. Lungs are clear to auscultation, no crackles or wheezes. Constatn dry cough Heart:  Normal rate and regular rhythm. S1 and S2 normal without gallop, murmur, click, rub or other extra sounds. Pulses:  R and L posterior tibial pulses are full and equal bilaterally  Extremities:  no edmea    Impression & Recommendations:  Problem # 1:  COUGH (ICD-786.2) Seems to have gotten worse since on prednisone..? due to  reflux..make diet changes to avoid acidic foods, continue protonix daily.  Given worsening and yellow productive mucus...will cover for bacterial infection.  Complete steroids, use albuterol as needed...but not clearly  asthma and allergies causing issues. Follow up if not imrpoving.  Tessalon perles for cough as needed.   Complete Medication List: 1)  Seasonale 0.15-0.03 Mg Tabs (Levonorgest-eth estrad 91-day) .... Take 1 tablet by mouth once a day 2)  Zyrtec Allergy 10 Mg Tabs (Cetirizine hcl) .... Take 1 tablet by mouth once a day 3)  Protonix 40 Mg Tbec (Pantoprazole sodium) .... Take 1 tablet by mouth once a day 4)  Lorazepam 0.5 Mg Tabs (Lorazepam) .... Take 1 tablet by mouth at bedtime as needed 5)  Elidel 1 % Crea (Pimecrolimus) .... Use two times a day as needed 6)  Levsin 0.125 Mg Tabs (Hyoscyamine sulfate) .... Take 1 tablet by mouth two times a day as needed abdominal pain 7)  Triamcinolone Acetonide 0.1 % Crea (Triamcinolone acetonide) .... Apply to affected rash two times a day as needed. 8)  Azithromycin 250 Mg Tabs (Azithromycin) .... 2 tab by mouth x 1 then 1 tab by mouth daily 9)  Tessalon Perles 100 Mg Caps (Benzonatate) .... 2 tab by mouth three times a day as needed cough  Patient Instructions: 1)  Antibitoics, cough saupressant. If not improving...follow up with primary MD.  Prescriptions: TESSALON PERLES 100 MG CAPS  (BENZONATATE) 2 tab by mouth three times a day as needed cough  #20 x 0   Entered and Authorized by:   Kerby Nora MD   Signed by:   Kerby Nora MD on 12/20/2009   Method used:   Electronically to        [facility]  [address] # [id number]* (retail)       [address]       [place], [place]  [address]       Ph: [phone] or [phone]       Fax: [phone]   RxID:   [id number] AZITHROMYCIN 250 MG TABS (AZITHROMYCIN) 2 tab by mouth x 1 then 1 tab by mouth daily  #6 x 0   Entered and Authorized by:   Kerby Nora MD   Signed by:   Kerby Nora MD on 12/20/2009   Method used:   Electronically to        [facility]  [address] # [id number]* (retail)       [address]       [place], [place]  [address]  Ph: [phone] or [phone]       Fax: [phone]   RxID:   [id number]

## 2010-09-15 ENCOUNTER — Encounter: Payer: Self-pay | Admitting: Internal Medicine

## 2010-09-16 ENCOUNTER — Encounter: Payer: Self-pay | Admitting: Internal Medicine

## 2010-09-16 ENCOUNTER — Ambulatory Visit (INDEPENDENT_AMBULATORY_CARE_PROVIDER_SITE_OTHER): Payer: PRIVATE HEALTH INSURANCE | Admitting: Internal Medicine

## 2010-09-16 DIAGNOSIS — M25519 Pain in unspecified shoulder: Secondary | ICD-10-CM | POA: Insufficient documentation

## 2010-09-16 DIAGNOSIS — F411 Generalized anxiety disorder: Secondary | ICD-10-CM

## 2010-09-16 DIAGNOSIS — F419 Anxiety disorder, unspecified: Secondary | ICD-10-CM

## 2010-09-16 MED ORDER — SERTRALINE HCL 50 MG PO TABS: 50.0000 mg | ORAL_TABLET | Freq: Every day | ORAL | Status: DC

## 2010-09-16 NOTE — Progress Notes (Signed)
  Subjective:    Patient ID: Kelly Woodward, female    DOB: 12-14-1960, 50 y.o.   MRN: [id number]  HPI  Stress: lots of stress related to dtr moving back home  In addition has right shoulder pain... On going for 6 months has been worsening since December. Got to  A point last week that "i can't ignore it any more"  Concerned with weight: she says she used to be able to eat everything---"now I can't"   Review of Systems     Objective:   Physical Exam  FROM right shoulder except with internal rotation---pain  no pain to palpation. Neck is supple without lymphadenopathy or thyromegaly. Chest clear to auscultation cardiac exam S1 and S2 are regular.       Assessment & Plan:

## 2010-09-16 NOTE — Assessment & Plan Note (Signed)
Check an xray and then may consider PT---probably has RTC tendinitis.

## 2010-09-16 NOTE — Assessment & Plan Note (Signed)
Discussed at length Trial sertraline Side effects discussed

## 2010-09-18 ENCOUNTER — Ambulatory Visit (INDEPENDENT_AMBULATORY_CARE_PROVIDER_SITE_OTHER)
Admission: RE | Admit: 2010-09-18 | Discharge: 2010-09-18 | Disposition: A | Discharge status: Home / Self Care | Payer: PRIVATE HEALTH INSURANCE | Source: Ambulatory Visit | Attending: Internal Medicine | Admitting: Internal Medicine

## 2010-09-18 DIAGNOSIS — M25519 Pain in unspecified shoulder: Secondary | ICD-10-CM

## 2010-09-23 ENCOUNTER — Telehealth: Payer: Self-pay | Admitting: Internal Medicine

## 2010-09-23 DIAGNOSIS — M25511 Pain in right shoulder: Secondary | ICD-10-CM

## 2010-09-23 NOTE — Telephone Encounter (Signed)
Triage vm----requesting xray results of her arm. She stated that her arm is still very sore. Please advise.

## 2010-09-24 NOTE — Telephone Encounter (Signed)
Per Dr Cato Mulligan, xray normal, refer to orthopedics.  Pt aware, referral placed

## 2010-10-13 ENCOUNTER — Encounter: Payer: Self-pay | Admitting: Internal Medicine

## 2010-10-13 ENCOUNTER — Ambulatory Visit: Payer: PRIVATE HEALTH INSURANCE | Admitting: Internal Medicine

## 2010-10-14 ENCOUNTER — Ambulatory Visit: Payer: PRIVATE HEALTH INSURANCE | Admitting: Internal Medicine

## 2010-10-29 ENCOUNTER — Other Ambulatory Visit: Payer: Self-pay | Admitting: *Deleted

## 2010-10-29 MED ORDER — FLUCONAZOLE 150 MG PO TABS: 150.0000 mg | ORAL_TABLET | Freq: Once | ORAL | Status: AC

## 2010-10-29 NOTE — Telephone Encounter (Signed)
Asking for refill on Ativan also.

## 2010-10-29 NOTE — Telephone Encounter (Signed)
Pt. Took Prednisone last week from her Orthopedist and has vaginal yeast, and thrush.  Would like to be treated for both.  Also, wants a refill on generic Ativan.

## 2010-10-29 NOTE — Telephone Encounter (Signed)
Diflucan 150 mg po x one dose

## 2010-11-02 MED ORDER — LORAZEPAM 0.5 MG PO TABS: 0.5000 mg | ORAL_TABLET | Freq: Every evening | ORAL | Status: DC | PRN

## 2010-11-02 NOTE — Telephone Encounter (Signed)
ok 

## 2010-11-09 LAB — POCT I-STAT, CHEM 8
BUN: 11 mg/dL (ref 6–23)
Calcium, Ion: 1.09 mmol/L — ABNORMAL LOW (ref 1.12–1.32)
Chloride: 104 mEq/L (ref 96–112)
Creatinine, Ser: 0.6 mg/dL (ref 0.4–1.2)
Glucose, Bld: 110 mg/dL — ABNORMAL HIGH (ref 70–99)
HCT: 39 % (ref 36.0–46.0)
Hemoglobin: 13.3 g/dL (ref 12.0–15.0)
Potassium: 3.6 mEq/L (ref 3.5–5.1)
Sodium: 136 mEq/L (ref 135–145)
TCO2: 21 mmol/L (ref 0–100)

## 2010-11-09 LAB — CBC
HCT: 37.3 % (ref 36.0–46.0)
Hemoglobin: 12.6 g/dL (ref 12.0–15.0)
MCHC: 33.8 g/dL (ref 30.0–36.0)
MCV: 90 fL (ref 78.0–100.0)
Platelets: 337 10*3/uL (ref 150–400)
RBC: 4.15 MIL/uL (ref 3.87–5.11)
RDW: 13.2 % (ref 11.5–15.5)
WBC: 15.2 10*3/uL — ABNORMAL HIGH (ref 4.0–10.5)

## 2010-11-09 LAB — DIFFERENTIAL
Basophils Absolute: 0 10*3/uL (ref 0.0–0.1)
Basophils Relative: 0 % (ref 0–1)
Eosinophils Absolute: 0 10*3/uL (ref 0.0–0.7)
Eosinophils Relative: 0 % (ref 0–5)
Lymphocytes Relative: 5 % — ABNORMAL LOW (ref 12–46)
Lymphs Abs: 0.7 10*3/uL (ref 0.7–4.0)
Monocytes Absolute: 0.6 10*3/uL (ref 0.1–1.0)
Monocytes Relative: 4 % (ref 3–12)
Neutro Abs: 13.7 10*3/uL — ABNORMAL HIGH (ref 1.7–7.7)
Neutrophils Relative %: 91 % — ABNORMAL HIGH (ref 43–77)

## 2010-11-16 ENCOUNTER — Telehealth: Payer: Self-pay | Admitting: *Deleted

## 2010-11-16 ENCOUNTER — Ambulatory Visit (INDEPENDENT_AMBULATORY_CARE_PROVIDER_SITE_OTHER): Payer: PRIVATE HEALTH INSURANCE | Admitting: Family Medicine

## 2010-11-16 ENCOUNTER — Encounter: Payer: Self-pay | Admitting: Family Medicine

## 2010-11-16 VITALS — BP 130/82 | Temp 98.5°F

## 2010-11-16 DIAGNOSIS — J309 Allergic rhinitis, unspecified: Secondary | ICD-10-CM

## 2010-11-16 MED ORDER — METHYLPREDNISOLONE ACETATE 80 MG/ML IJ SUSP
80.0000 mg | Freq: Once | INTRAMUSCULAR | Status: AC
  Administered 2010-11-16: 80 mg via INTRAMUSCULAR

## 2010-11-16 NOTE — Telephone Encounter (Signed)
Notified pt. 

## 2010-11-16 NOTE — Telephone Encounter (Signed)
Pt here for acute OV, requesting transfer of care from Dr Cato Mulligan (whom she loves) to Dr Caryl Never, availability

## 2010-11-16 NOTE — Patient Instructions (Signed)
Continue with plenty of fluids and mucinex /sudafed as needed for congestion. Follow up promptly for any fever or worsening symptoms.

## 2010-11-16 NOTE — Progress Notes (Signed)
  Subjective:    Patient ID: Kelly Woodward, female    DOB: 05-Mar-1961, 50 y.o.   MRN: [id number]  HPI Patient is seen with several day history of nasal congestion. For the most part clear but this morning had some greenish discharge left naris. History of seasonal allergies. Takes Zyrtec 10 mg daily. Has had nosebleeds with several steroid inhalers and possible side effects with the Singulair. She is responded very well to steroids in the past. Denies any recent fever or chills. Mild sore throat which she attributes to postnasal drip. One episode of diarrhea yesterday which is nonbloody but none since then. No nausea or vomiting.   Review of Systems See HPI    Objective:   Physical Exam  Constitutional: She appears well-developed and well-nourished. No distress.  HENT:  Right Ear: External ear normal.  Left Ear: External ear normal.  Mouth/Throat: Oropharynx is clear and moist. No oropharyngeal exudate.       Thick but clear mucus both nares  Neck: Neck supple.  Cardiovascular: Normal rate, regular rhythm and normal heart sounds.   No murmur heard. Pulmonary/Chest: Effort normal and breath sounds normal. No respiratory distress. She has no wheezes. She has no rales.  Musculoskeletal: She exhibits no edema.  Lymphadenopathy:    She has no cervical adenopathy.          Assessment & Plan:  Perennial allergic rhinitis. Patient intolerant of medications as above. Depo-Medrol 80 mg IM given. Follow up promptly for signs of secondary infection.

## 2010-11-16 NOTE — Telephone Encounter (Signed)
ok 

## 2010-11-18 ENCOUNTER — Encounter: Payer: Self-pay | Admitting: Family Medicine

## 2010-11-18 ENCOUNTER — Ambulatory Visit (INDEPENDENT_AMBULATORY_CARE_PROVIDER_SITE_OTHER): Payer: PRIVATE HEALTH INSURANCE | Admitting: Family Medicine

## 2010-11-18 VITALS — BP 110/80 | Temp 98.6°F

## 2010-11-18 DIAGNOSIS — J019 Acute sinusitis, unspecified: Secondary | ICD-10-CM

## 2010-11-18 MED ORDER — BENZONATATE 200 MG PO CAPS: 200.0000 mg | ORAL_CAPSULE | Freq: Three times a day (TID) | ORAL | Status: AC | PRN

## 2010-11-18 MED ORDER — AMOXICILLIN 875 MG PO TABS: 875.0000 mg | ORAL_TABLET | Freq: Two times a day (BID) | ORAL | Status: AC

## 2010-11-18 NOTE — Progress Notes (Signed)
  Subjective:    Patient ID: Kelly Woodward, female    DOB: 1961/04/18, 50 y.o.   MRN: [id number]  HPI Patient seen with worsening sinus symptoms. Left maxillary facial pain.-colored nasal discharge. Cough has progressed as well. Still no fever. No relief with over-the-counter medications. Frequent sinusitis the past. Nonsmoker.   Review of Systems  Constitutional: Positive for chills and fatigue. Negative for fever.  HENT: Positive for congestion, postnasal drip and sinus pressure.   Respiratory: Positive for cough. Negative for shortness of breath.   Cardiovascular: Negative for chest pain.       Objective:   Physical Exam  Constitutional: She appears well-developed and well-nourished.  HENT:  Right Ear: External ear normal.  Left Ear: External ear normal.  Nose: Nose normal.  Mouth/Throat: Oropharynx is clear and moist. No oropharyngeal exudate.  Cardiovascular: Normal rate, regular rhythm and normal heart sounds.   No murmur heard. Pulmonary/Chest: Effort normal and breath sounds normal. No respiratory distress. She has no wheezes. She has no rales.          Assessment & Plan:  Acute sinusitis. Amoxicillin 875 mg twice a day for 10 days. Tessalon Perles 200 mg every 8 hours as needed for cough.

## 2010-11-23 ENCOUNTER — Ambulatory Visit (INDEPENDENT_AMBULATORY_CARE_PROVIDER_SITE_OTHER): Payer: PRIVATE HEALTH INSURANCE | Admitting: Family Medicine

## 2010-11-23 ENCOUNTER — Encounter: Payer: Self-pay | Admitting: Family Medicine

## 2010-11-23 VITALS — BP 140/70 | Temp 98.6°F

## 2010-11-23 DIAGNOSIS — H10029 Other mucopurulent conjunctivitis, unspecified eye: Secondary | ICD-10-CM

## 2010-11-23 DIAGNOSIS — H109 Unspecified conjunctivitis: Secondary | ICD-10-CM

## 2010-11-23 MED ORDER — POLYMYXIN B-TRIMETHOPRIM 10000-0.1 UNIT/ML-% OP SOLN: 1.0000 [drp] | OPHTHALMIC | Status: AC

## 2010-11-23 NOTE — Patient Instructions (Signed)
Use antibiotic drops every 2 hours while awake today.

## 2010-11-23 NOTE — Progress Notes (Signed)
  Subjective:    Patient ID: Kelly Woodward, female    DOB: February 08, 1961, 50 y.o.   MRN: [id number]  HPI Patient seen with one-day history of right eye redness and matting and drainage. Starting to develop some similar symptoms left eye today. Recent sinusitis with symptoms improving. No fever or chills. No recent contact use. No blurred vision. No eye pain.   Review of Systems  Constitutional: Negative for fever and chills.  HENT: Positive for sinus pressure.   Eyes: Positive for discharge and redness. Negative for photophobia, pain and visual disturbance.  Respiratory: Negative for cough and shortness of breath.   Cardiovascular: Negative for chest pain.       Objective:   Physical Exam  Constitutional: She appears well-developed and well-nourished.  HENT:  Right Ear: External ear normal.  Left Ear: External ear normal.  Mouth/Throat: Oropharynx is clear and moist. No oropharyngeal exudate.  Eyes:       Right and left conjunctivae are erythematous right greater than left. Pupils are equal round and reactive to light minimal drainage right eye.  Cardiovascular: Normal rate and regular rhythm.           Assessment & Plan:  Bacterial conjunctivitis. Warm compresses several times daily. Polytrim ophthalmic drops

## 2010-12-14 ENCOUNTER — Other Ambulatory Visit: Payer: Self-pay | Admitting: Internal Medicine

## 2011-01-22 ENCOUNTER — Other Ambulatory Visit: Payer: Self-pay | Admitting: Internal Medicine

## 2011-01-25 ENCOUNTER — Other Ambulatory Visit: Payer: Self-pay | Admitting: *Deleted

## 2011-04-23 ENCOUNTER — Ambulatory Visit (INDEPENDENT_AMBULATORY_CARE_PROVIDER_SITE_OTHER): Payer: PRIVATE HEALTH INSURANCE | Admitting: Family Medicine

## 2011-04-23 ENCOUNTER — Encounter: Payer: Self-pay | Admitting: Family Medicine

## 2011-04-23 VITALS — BP 130/82 | Temp 98.3°F | Wt 164.0 lb

## 2011-04-23 DIAGNOSIS — J309 Allergic rhinitis, unspecified: Secondary | ICD-10-CM

## 2011-04-23 DIAGNOSIS — M719 Bursopathy, unspecified: Secondary | ICD-10-CM

## 2011-04-23 DIAGNOSIS — M7580 Other shoulder lesions, unspecified shoulder: Secondary | ICD-10-CM

## 2011-04-23 DIAGNOSIS — M67919 Unspecified disorder of synovium and tendon, unspecified shoulder: Secondary | ICD-10-CM

## 2011-04-23 MED ORDER — PREDNISONE 10 MG PO TABS: ORAL_TABLET | ORAL | Status: DC

## 2011-04-23 NOTE — Progress Notes (Signed)
  Subjective:    Patient ID: Kelly Woodward, female    DOB: 03-31-1961, 50 y.o.   MRN: [id number]  HPI Patient has history of seasonal allergies. Recently had increased postnasal drip and laryngitis symptoms. Sings frequently and concern regarding laryngitis issues. Tried nasal steroids with nosebleed. No improvement with Singulair or Astelin. Takes Zyrtec regularly. Has responded to steroids in the past for resistant symptoms. No symptoms of infection such as fever or facial pain.  Recurrent right shoulder pain. No injury. No weakness. Similar symptoms in past and orthopedist gave oral steroids and symptoms improved.   Review of Systems  Constitutional: Negative for fever and chills.  HENT: Positive for congestion, voice change and postnasal drip. Negative for sore throat.   Respiratory: Negative for cough.   Neurological: Negative for headaches.       Objective:   Physical Exam  Constitutional: She appears well-developed and well-nourished.  HENT:  Right Ear: External ear normal.  Left Ear: External ear normal.  Mouth/Throat: Oropharynx is clear and moist.  Neck: Neck supple.  Cardiovascular: Normal rate and regular rhythm.   Pulmonary/Chest: Effort normal and breath sounds normal. No respiratory distress. She has no wheezes. She has no rales.  Musculoskeletal: She exhibits no edema.  Lymphadenopathy:    She has no cervical adenopathy.          Assessment & Plan:  Allergic rhinitis with associated laryngitis. Prednisone taper Right rotator cuff tendinitis. Oral prednisone as above. Consider cortisone injection right shoulder if recurs

## 2011-06-30 ENCOUNTER — Other Ambulatory Visit: Payer: Self-pay | Admitting: Family Medicine

## 2011-06-30 ENCOUNTER — Ambulatory Visit (INDEPENDENT_AMBULATORY_CARE_PROVIDER_SITE_OTHER): Payer: PRIVATE HEALTH INSURANCE | Admitting: Family Medicine

## 2011-06-30 DIAGNOSIS — Z23 Encounter for immunization: Secondary | ICD-10-CM

## 2011-06-30 MED ORDER — LORAZEPAM 0.5 MG PO TABS: 0.5000 mg | ORAL_TABLET | ORAL | Status: DC | PRN

## 2011-06-30 MED ORDER — PANTOPRAZOLE SODIUM 40 MG PO TBEC: 40.0000 mg | DELAYED_RELEASE_TABLET | Freq: Every day | ORAL | Status: DC

## 2011-06-30 NOTE — Telephone Encounter (Signed)
Refill okay?  

## 2011-06-30 NOTE — Telephone Encounter (Signed)
Lorazepam refill request, pt takes prn, last filled 01-22-11, #30 with 1 refill

## 2011-09-24 ENCOUNTER — Ambulatory Visit: Payer: Self-pay | Admitting: Obstetrics and Gynecology

## 2011-10-28 ENCOUNTER — Other Ambulatory Visit: Payer: Self-pay | Admitting: Obstetrics and Gynecology

## 2011-10-28 ENCOUNTER — Encounter (INDEPENDENT_AMBULATORY_CARE_PROVIDER_SITE_OTHER): Payer: PRIVATE HEALTH INSURANCE | Admitting: Obstetrics and Gynecology

## 2011-10-28 DIAGNOSIS — Z3041 Encounter for surveillance of contraceptive pills: Secondary | ICD-10-CM

## 2011-10-28 DIAGNOSIS — Z01419 Encounter for gynecological examination (general) (routine) without abnormal findings: Secondary | ICD-10-CM

## 2011-10-28 DIAGNOSIS — Z1231 Encounter for screening mammogram for malignant neoplasm of breast: Secondary | ICD-10-CM

## 2011-11-12 ENCOUNTER — Ambulatory Visit
Admission: RE | Admit: 2011-11-12 | Discharge: 2011-11-12 | Disposition: A | Discharge status: Home / Self Care | Payer: PRIVATE HEALTH INSURANCE | Source: Ambulatory Visit | Attending: Obstetrics and Gynecology | Admitting: Obstetrics and Gynecology

## 2011-11-12 DIAGNOSIS — Z1231 Encounter for screening mammogram for malignant neoplasm of breast: Secondary | ICD-10-CM

## 2011-12-21 ENCOUNTER — Ambulatory Visit (INDEPENDENT_AMBULATORY_CARE_PROVIDER_SITE_OTHER): Payer: PRIVATE HEALTH INSURANCE | Admitting: Family Medicine

## 2011-12-21 ENCOUNTER — Encounter: Payer: Self-pay | Admitting: Family Medicine

## 2011-12-21 ENCOUNTER — Ambulatory Visit (INDEPENDENT_AMBULATORY_CARE_PROVIDER_SITE_OTHER)
Admission: RE | Admit: 2011-12-21 | Discharge: 2011-12-21 | Disposition: A | Discharge status: Home / Self Care | Payer: PRIVATE HEALTH INSURANCE | Source: Ambulatory Visit | Attending: Family Medicine | Admitting: Family Medicine

## 2011-12-21 VITALS — BP 130/80 | HR 113 | Temp 99.0°F | Wt 163.0 lb

## 2011-12-21 DIAGNOSIS — R0781 Pleurodynia: Secondary | ICD-10-CM

## 2011-12-21 DIAGNOSIS — R079 Chest pain, unspecified: Secondary | ICD-10-CM

## 2011-12-21 DIAGNOSIS — B370 Candidal stomatitis: Secondary | ICD-10-CM

## 2011-12-21 MED ORDER — DICLOFENAC SODIUM 75 MG PO TBEC: 75.0000 mg | DELAYED_RELEASE_TABLET | Freq: Two times a day (BID) | ORAL | Status: AC | PRN

## 2011-12-21 MED ORDER — NYSTATIN 100000 UNIT/ML MT SUSP: 500000.0000 [IU] | Freq: Four times a day (QID) | OROMUCOSAL | Status: AC

## 2011-12-21 NOTE — Progress Notes (Signed)
  Subjective:    Patient ID: Kelly Woodward, female    DOB: 06-Mar-1961, 51 y.o.   MRN: [id number]  HPI Here for 2 weeks of sharp pains in the right ribs with no trauma hx. It had been feeling a little better but after a beach trip thi past weekend it got worse again. No cough or SOB. No rash. Also she has had some thrush after a course of prednisone. She tried a single dose of Diflucan with no response.    Review of Systems  Constitutional: Negative.   Respiratory: Negative.   Cardiovascular: Positive for chest pain.       Objective:   Physical Exam  Constitutional: She appears well-developed and well-nourished.  HENT:       Whitish color to the tongue   Cardiovascular: Normal rate, regular rhythm, normal heart sounds and intact distal pulses.   Pulmonary/Chest: Effort normal and breath sounds normal. No respiratory distress. She has no wheezes. She has no rales.       Very tender over a spot on the right side           Assessment & Plan:  Get a rib Xray. This is probably a costochondral separation. Use Diclofenac and heat. Try Nystatin for the thrush.

## 2011-12-22 NOTE — Progress Notes (Signed)
Quick Note:  I spoke with pt ______ 

## 2012-01-11 ENCOUNTER — Other Ambulatory Visit: Payer: Self-pay | Admitting: Family Medicine

## 2012-01-11 NOTE — Telephone Encounter (Signed)
OK to refill Protonix for 6 months and Lorazepam once #30

## 2012-01-11 NOTE — Telephone Encounter (Signed)
Lorazepam last filled 06-30-11, #30 with 1 refill

## 2012-04-10 ENCOUNTER — Ambulatory Visit (INDEPENDENT_AMBULATORY_CARE_PROVIDER_SITE_OTHER): Payer: PRIVATE HEALTH INSURANCE | Admitting: Internal Medicine

## 2012-04-10 ENCOUNTER — Encounter: Payer: Self-pay | Admitting: Internal Medicine

## 2012-04-10 VITALS — BP 132/84 | Temp 97.8°F | Wt 166.0 lb

## 2012-04-10 DIAGNOSIS — J309 Allergic rhinitis, unspecified: Secondary | ICD-10-CM

## 2012-04-10 MED ORDER — PREDNISONE 20 MG PO TABS: ORAL_TABLET | ORAL | Status: DC

## 2012-04-10 NOTE — Assessment & Plan Note (Signed)
51 year old white female with severe exacerbation of allergic rhinitis. She is sensitive to ragweed. Treat with prednisone taper. Unfortunately, she has not been able to tolerate intranasal therapies in the past. Refer to Dr. Maple Hudson to see if she is a candidate for Xolair.

## 2012-04-10 NOTE — Patient Instructions (Signed)
Please call our office if your symptoms do not improve or gets worse. Our office will contact you re: allergy referral  (Dr. Fannie Knee)   Xolair (omalizumab)

## 2012-04-10 NOTE — Progress Notes (Signed)
Subjective:    Patient ID: Kelly Woodward, female    DOB: 31-Aug-1960, 51 y.o.   MRN: [id number]  HPI  51 year old white female with history of allergic rhinitis complains of significant exacerbation for the last 2-4 weeks. She has long history of allergic rhinitis. She has tried numerous nose sprays in the past. She tried intranasal steroids as well as Astelin nose spray. She was unable to tolerate all of these sprays due to nosebleeds and lack of efficacy.  She was tested for allergies in the past. She has multiple triggers including mold, ragweed and certain pollens. Her primary care physician has used prednisone taper with previous exacerbations.  Prednisone does cause her to feel "ramped up".  Review of Systems Negative for fever chills, negative for purulent nasal discharge  Past Medical History  Diagnosis Date  . Allergy     Rhinitis  . History of nephrolithiasis 1996  . Toxemia in pregnancy   . HELLP (hemolytic anemia/elev liver enzymes/low platelets in pregnancy)     History   Social History  . Marital Status: Married    Spouse Name: N/A    Number of Children: N/A  . Years of Education: N/A   Occupational History  . Not on file.   Social History Main Topics  . Smoking status: Never Smoker   . Smokeless tobacco: Never Used  . Alcohol Use: Yes     once a month or less  . Drug Use: No  . Sexually Active: Not on file   Other Topics Concern  . Not on file   Social History Narrative  . No narrative on file    Past Surgical History  Procedure Date  . 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 Cancer Daughter     melanoma- 2011    Allergies  Allergen Reactions  . Astelin     nosebleeds    Current Outpatient Prescriptions on File Prior to Visit  Medication Sig Dispense Refill  . cetirizine (ZYRTEC) 10 MG tablet Take 10 mg by mouth daily.        . diclofenac (VOLTAREN) 75 MG EC tablet Take 1 tablet (75 mg total) by  mouth 2 (two) times daily as needed (pain).  60 tablet  2  . levonorgestrel-ethinyl estradiol (SEASONALE) 0.15-0.03 MG per tablet Take 1 tablet by mouth daily.        Marland Kitchen LORazepam (ATIVAN) 0.5 MG tablet TAKE 1 TABLET BY MOUTH AS NEEDED FOR ANXIETY.  30 tablet  0  . pantoprazole (PROTONIX) 40 MG tablet take 1 tablet by mouth once daily  90 tablet  1  . pimecrolimus (ELIDEL) 1 % cream Apply topically 2 (two) times daily as needed.        . triamcinolone (KENALOG) 0.1 % cream Apply topically 2 (two) times daily as needed.          BP 132/84  Temp 97.8 F (36.6 C) (Oral)  Wt 166 lb (75.297 kg)       Objective:   Physical Exam  Constitutional: She appears well-developed and well-nourished.  HENT:  Head: Normocephalic and atraumatic.  Right Ear: External ear normal.  Left Ear: External ear normal.       Mild edema of nasal turbinates  Neck: Neck supple.       Neck is nontender  Cardiovascular: Normal rate, regular rhythm and normal heart sounds.   Pulmonary/Chest: Effort normal and breath sounds normal. She has no  wheezes.  Musculoskeletal: She exhibits no edema.  Lymphadenopathy:    She has no cervical adenopathy.          Assessment & Plan:

## 2012-04-27 ENCOUNTER — Other Ambulatory Visit: Payer: Self-pay | Admitting: Family Medicine

## 2012-05-02 NOTE — Telephone Encounter (Signed)
Last OV was with Dr Artist Pais on 04-07-12 for severe allergies. Lorazepam last filled 01-11-12, #30 with 0 refills

## 2012-05-02 NOTE — Telephone Encounter (Signed)
Refill once 

## 2012-05-03 NOTE — Telephone Encounter (Signed)
Last seen by you - 03/2011 Was seen by Dr. Artist Pais 03/2012 Last written 01/11/12 # 30 0RF Please advise

## 2012-05-04 NOTE — Telephone Encounter (Signed)
Refill once 

## 2012-05-23 ENCOUNTER — Institutional Professional Consult (permissible substitution): Payer: PRIVATE HEALTH INSURANCE | Admitting: Internal Medicine

## 2012-05-29 ENCOUNTER — Encounter: Payer: Self-pay | Admitting: Internal Medicine

## 2012-05-29 ENCOUNTER — Other Ambulatory Visit: Payer: PRIVATE HEALTH INSURANCE

## 2012-05-29 ENCOUNTER — Ambulatory Visit (INDEPENDENT_AMBULATORY_CARE_PROVIDER_SITE_OTHER): Payer: PRIVATE HEALTH INSURANCE | Admitting: Internal Medicine

## 2012-05-29 VITALS — BP 126/78 | HR 89 | Ht 69.0 in | Wt 166.4 lb

## 2012-05-29 DIAGNOSIS — J309 Allergic rhinitis, unspecified: Secondary | ICD-10-CM

## 2012-05-29 DIAGNOSIS — J3089 Other allergic rhinitis: Secondary | ICD-10-CM

## 2012-05-29 DIAGNOSIS — R040 Epistaxis: Secondary | ICD-10-CM

## 2012-05-29 DIAGNOSIS — J302 Other seasonal allergic rhinitis: Secondary | ICD-10-CM

## 2012-05-29 NOTE — Patient Instructions (Addendum)
Order- lab Allergy profile    Dx allergic rhinitis  Flu vax  Consider trying Breathe Right nasal strips  Order- referral to [facility]   For dx recurrent epistaxis  Ok to try a decongestant like Sudafed or otc phenylephrine Sudafed-PE and others) as needed and tolerated

## 2012-05-29 NOTE — Progress Notes (Signed)
05/29/12- 51 yoF never smoker, referred courtesy of Dr Artist Pais for allergy evaluation She says that since about age 51 years old she has been having seasonal and now perennial rhinitis with nasal congestion, itching, watering and itching eyes. Fall season is worse and she tends to need steroid therapy in the fall, sometimes also spring. She was on allergy shots in college. She missed a few shots and when she started back she had recurrent reactions including hives and palpitations so she stopped vaccine. Has tried many nasal sprays. Tried Singulair. Triggers include seasonal pollens, molds and odors such as scented candles. 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 She is a music therapist/ singer  and very anxious not to do anything that makes her voice worse. Takes perennial Zyrtec. Has not had asthma.  Prior to Admission medications   Medication Sig Start Date End Date Taking? Authorizing Provider  b complex vitamins tablet Take 1 tablet by mouth daily.   Yes Historical Provider, MD  cetirizine (ZYRTEC) 10 MG tablet Take 10 mg by mouth daily.     Yes Historical Provider, MD  diclofenac (VOLTAREN) 75 MG EC tablet Take 1 tablet (75 mg total) by mouth 2 (two) times daily as needed (pain). 12/21/11 12/20/12 Yes Nelwyn Salisbury, MD  levonorgestrel-ethinyl estradiol (SEASONALE) 0.15-0.03 MG per tablet Take 1 tablet by mouth daily.     Yes Historical Provider, MD  LORazepam (ATIVAN) 0.5 MG tablet take 1 tablet by mouth if needed for anxiety 04/27/12  Yes Kristian Covey, MD  pantoprazole (PROTONIX) 40 MG tablet take 1 tablet by mouth once daily 01/11/12  Yes Kristian Covey, MD  triamcinolone (KENALOG) 0.1 % cream Apply topically 2 (two) times daily as needed.     Yes Historical Provider, MD  pimecrolimus (ELIDEL) 1 % cream Apply topically 2 (two) times daily as needed.      Historical Provider, MD   Past Medical History  Diagnosis Date  . Allergy    Rhinitis  . History of nephrolithiasis 1996  . Toxemia in pregnancy   . HELLP (hemolytic anemia/elev liver enzymes/low platelets in pregnancy)    Past Surgical History  Procedure Date  . 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 Cancer Daughter     melanoma- 2011   History   Social History  . Marital Status: Married    Spouse Name: N/A    Number of Children: 2  . Years of Education: N/A   Occupational History  . Not on file.   Social History Main Topics  . Smoking status: Never Smoker   . Smokeless tobacco: Never Used  . Alcohol Use: Yes     Comment: once a month or less  . Drug Use: No  . Sexually Active: Not on file   Other Topics Concern  . Not on file   Social History Narrative  . No narrative on file   ROS-see HPI Constitutional:   No-   weight loss, night sweats, fevers, chills, fatigue, lassitude. HEENT:   No-  headaches, difficulty swallowing, tooth/dental problems, sore throat,       + sneezing, itching, ear ache, nasal congestion, post nasal drip,  CV:  No-   chest pain, orthopnea, PND, swelling in lower extremities, anasarca, dizziness, palpitations Resp: No-   shortness of breath with exertion or at rest.  No-   productive cough,  No non-productive cough,  No- coughing up of blood.              No-   change in color of mucus.  No- wheezing.   Skin: No-   rash or lesions. GI:  No-   heartburn, indigestion, abdominal pain, nausea, vomiting, diarrhea,                 change in bowel habits, loss of appetite GU: No-   dysuria, change in color of urine, no urgency or frequency.  No- flank pain. MS:  No-   joint pain or swelling.  No- decreased range of motion.  No- back pain. Neuro-     nothing unusual Psych:  No- change in mood or affect. No depression or anxiety.  No memory loss.  OBJ- Physical Exam General- Alert, Oriented, Affect-appropriate, Distress- none acute Skin- rash-none, lesions- none,  excoriation- none Lymphadenopathy- none Head- atraumatic            Eyes- Gross vision intact, PERRLA, conjunctivae and secretions clear            Ears- Hearing, canals-normal            Nose- + small scab and crusting mucus on right side of the septum,+ slight nasal quality to  Speech, +sniffing, no-Septal dev, mucus, polyps, erosion, perforation             Throat- Mallampati III , mucosa clear , drainage- none, tonsils- atrophic Neck- flexible , trachea midline, no stridor , thyroid nl, carotid no bruit Chest - symmetrical excursion , unlabored           Heart/CV- RRR , no murmur , no gallop  , no rub, nl s1 s2                           - JVD- none , edema- none, stasis changes- none, varices- none           Lung- clear to P&A, wheeze- none, cough- none , dullness-none, rub- none           Chest wall-  Abd- tender-no, distended-no, bowel sounds-present, HSM- no Br/ Gen/ Rectal- Not done, not indicated Extrem- cyanosis- none, clubbing, none, atrophy- none, strength- nl Neuro- grossly intact to observation

## 2012-05-30 LAB — ALLERGY FULL PROFILE
Allergen, D pternoyssinus,d7: <0.1 kU/L
Allergen,Goose feathers, e70: <0.1 kU/L
Aspergillus fumigatus, IgG: <0.1 kU/L
Box Elder IgE: <0.1 kU/L
Common Ragweed: 4.36 kU/L — ABNORMAL HIGH
D. farinae: <0.1 kU/L
Elm IgE: <0.1 kU/L
G005 Rye, Perennial: <0.1 kU/L
G009 Red Top: <0.1 kU/L
House Dust Hollister: <0.1 kU/L
IgE (Immunoglobulin E), Serum: 26.7 IU/mL (ref 0.0–180.0)
Oak: <0.1 kU/L
Plantain: <0.1 kU/L
Stemphylium Botryosum: <0.1 kU/L
Timothy Grass: <0.1 kU/L

## 2012-06-02 NOTE — Progress Notes (Signed)
Quick Note:  LMTCB ______ 

## 2012-06-10 NOTE — Assessment & Plan Note (Signed)
Plan-sample Dymista nasal spray when available, using 1 puff alternate nostrils every other day           Nasal strips/ saline gel           Allergy profile We may need to have an ENT cauterize the bleeding area in her nostrils so that she can better tolerate nasal sprays

## 2012-06-16 NOTE — Progress Notes (Signed)
Quick Note:  LMTCB ______ 

## 2012-06-26 ENCOUNTER — Telehealth: Payer: Self-pay | Admitting: Internal Medicine

## 2012-06-26 NOTE — Telephone Encounter (Signed)
Called and spoke with patient, informed her of results as listed below per Dr. Maple Hudson. Patient verbalized understanding and nothing further needed at this time.  Notes Recorded by Waymon Budge, MD on 05/31/2012 at 1:21 PM Allergy profile- Total allergy antibody level (IgE is not elevated). There is significant specific elevation only for ragweed and goldenrod. If conservative measures aren't enough, it may be ok for [place] to talk about allergy skin testing. We can discuss at next ov.

## 2012-06-30 NOTE — Progress Notes (Signed)
Quick Note:  Lazarus Salines, RN 06/26/2012 2:44 PM Signed Called and spoke with patient, informed her of results as listed below per Dr. Maple Hudson. Patient verbalized understanding and nothing further needed at this time.    ______

## 2012-07-28 ENCOUNTER — Ambulatory Visit: Payer: PRIVATE HEALTH INSURANCE | Admitting: Internal Medicine

## 2012-08-15 ENCOUNTER — Other Ambulatory Visit: Payer: Self-pay | Admitting: Family Medicine

## 2012-08-16 NOTE — Telephone Encounter (Signed)
Refill pantoprozole for 6 months Refill lorazepam once.

## 2012-08-16 NOTE — Telephone Encounter (Signed)
Lorazepam last filled 04-27-12, #30 with 0 refills Pt last seen by Dr Artist Pais 04-10-12 for severe allergies

## 2012-10-02 ENCOUNTER — Encounter: Payer: PRIVATE HEALTH INSURANCE | Admitting: Obstetrics and Gynecology

## 2012-10-09 ENCOUNTER — Encounter: Payer: Self-pay | Admitting: Obstetrics and Gynecology

## 2012-10-09 ENCOUNTER — Ambulatory Visit: Payer: PRIVATE HEALTH INSURANCE | Admitting: Obstetrics and Gynecology

## 2012-10-09 VITALS — BP 112/64 | Temp 98.4°F | Ht 69.0 in | Wt 163.0 lb

## 2012-10-09 DIAGNOSIS — Z1321 Encounter for screening for nutritional disorder: Secondary | ICD-10-CM

## 2012-10-09 DIAGNOSIS — R6882 Decreased libido: Secondary | ICD-10-CM

## 2012-10-09 DIAGNOSIS — N951 Menopausal and female climacteric states: Secondary | ICD-10-CM

## 2012-10-09 LAB — TSH: TSH: 1.625 u[IU]/mL (ref 0.350–4.500)

## 2012-10-09 MED ORDER — LEVONORGESTREL 0.75 MG PO TABS: 0.7500 mg | ORAL_TABLET | Freq: Two times a day (BID) | ORAL | Status: DC

## 2012-10-09 NOTE — Progress Notes (Signed)
Pt c/o menopause sx. Hot flashes, and mood swings. Also having irregular cycles. 9 day cycle in December and no bleeding since.

## 2012-10-09 NOTE — Progress Notes (Signed)
51 YO on Seasonale complaining of hot flashes, moodiness and irregular bleeding since the end of November.  Mostly has night sweats that keep her from sleeping (also dreams diagnosed as being related to anxiety that keep her from sleeping throughout the night) . She's been given Ativan for these dreams that works well but she has also taken [place] with good results.   Stopped Seasonale in December and no bleeding since (previously had bled for 3 weeks on the Seasonale) After discontinuing the Seasonale she stopped bleeding within 9 days.   In addition to above symptoms she's had vaginal dryness and has no sex drive all since December.  A: Menopausal Symptoms     Insomnia related to #1       P:  TSH, Vitamin D 25-H, Testosterone-pending        Reviewed management options for menopausal symptoms: herbal,       hormonal and miscellaneous        Patient given brochure on Estrovera (rhubarb root) and will also consider Progesterone Cream        Patient advised to use barrier contraception        RTO-as scheduled or prn  Daejah Klebba, PA-C

## 2012-10-30 ENCOUNTER — Encounter: Payer: Self-pay | Admitting: Family Medicine

## 2012-10-30 ENCOUNTER — Ambulatory Visit (INDEPENDENT_AMBULATORY_CARE_PROVIDER_SITE_OTHER): Payer: PRIVATE HEALTH INSURANCE | Admitting: Family Medicine

## 2012-10-30 VITALS — BP 140/80 | Temp 98.3°F | Wt 166.0 lb

## 2012-10-30 DIAGNOSIS — IMO0001 Reserved for inherently not codable concepts without codable children: Secondary | ICD-10-CM

## 2012-10-30 MED ORDER — LORAZEPAM 0.5 MG PO TABS: 0.5000 mg | ORAL_TABLET | Freq: Three times a day (TID) | ORAL | Status: DC | PRN

## 2012-10-30 MED ORDER — CYCLOBENZAPRINE HCL 5 MG PO TABS: 5.0000 mg | ORAL_TABLET | Freq: Every evening | ORAL | Status: DC | PRN

## 2012-10-30 NOTE — Patient Instructions (Addendum)
Touch base with me in 2-3 weeks if no improvement.

## 2012-10-30 NOTE — Progress Notes (Signed)
  Subjective:    Patient ID: Kelly Woodward, female    DOB: May 16, 1961, 52 y.o.   MRN: [id number]  HPI One month hx of achiness muscles and joints. Arthralgias knees, thumbs, shoulders.  No recent fever or rash. Severity is moderate. No objective signs of joint swelling. Tried Aleve with on relief.   Patient recently went through menopause. She takes some of her fatigue and other issues may be related. Recent TSH per gynecologist normal.  Very poor sleep quality. No depression issues. Has taken Ativan as needed in the past. No family history of inflammatory arthritis  Past Medical History  Diagnosis Date  . Allergy     Rhinitis  . History of nephrolithiasis 1996  . Toxemia in pregnancy   . HELLP (hemolytic anemia/elev liver enzymes/low platelets in pregnancy)   . Osteopenia    Past Surgical History  Procedure Laterality Date  . 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 She has never used smokeless tobacco. She reports that  drinks alcohol. She reports that she does not use illicit drugs. family history includes Cancer in her daughter. Allergies  Allergen Reactions  . Astelin     nosebleeds      Review of Systems  Constitutional: Positive for fatigue. Negative for appetite change and unexpected weight change.  Respiratory: Negative for cough and shortness of breath.   Cardiovascular: Negative for chest pain and leg swelling.  Gastrointestinal: Negative for abdominal pain.  Genitourinary: Negative for dysuria.  Musculoskeletal: Positive for myalgias and arthralgias. Negative for joint swelling and gait problem.  Skin: Negative for rash.  Hematological: Negative for adenopathy. Does not bruise/bleed easily.       Objective:   Physical Exam  Constitutional: She appears well-developed and well-nourished.  Neck: Neck supple. No thyromegaly present.  Cardiovascular: Normal rate and regular rhythm.   Pulmonary/Chest: Effort normal and breath  sounds normal. No respiratory distress. She has no wheezes. She has no rales.  Musculoskeletal: She exhibits no edema.  Joints reveal no evidence for inflammation such as erythema, warmth, or edema Patient has several symmetric classic trigger points for fibromyalgia  Lymphadenopathy:    She has no cervical adenopathy.  Skin: No rash noted.          Assessment & Plan:  Myalgias. She has several classic trigger points for fibromyalgia. Probably exacerbated by recent poor sleep. Aerobic exercise as tolerated. Flexeril 5 mg each bedtime. Refill lorazepam for as needed use at night if not getting adequate relief with Flexeril. Consider labs to rule out inflammatory changes at followup in 2-3 weeks if no better. Doubt inflammatory arthritis.

## 2012-11-30 ENCOUNTER — Other Ambulatory Visit: Payer: Self-pay | Admitting: Obstetrics and Gynecology

## 2012-11-30 DIAGNOSIS — Z1231 Encounter for screening mammogram for malignant neoplasm of breast: Secondary | ICD-10-CM

## 2013-01-01 ENCOUNTER — Other Ambulatory Visit (HOSPITAL_COMMUNITY)
Admission: RE | Admit: 2013-01-01 | Discharge: 2013-01-01 | Disposition: A | Discharge status: Home / Self Care | Payer: Managed Care, Other (non HMO) | Source: Ambulatory Visit | Attending: Family Medicine | Admitting: Family Medicine

## 2013-01-01 ENCOUNTER — Encounter: Payer: Self-pay | Admitting: Family Medicine

## 2013-01-01 ENCOUNTER — Ambulatory Visit (INDEPENDENT_AMBULATORY_CARE_PROVIDER_SITE_OTHER): Payer: Managed Care, Other (non HMO) | Admitting: Family Medicine

## 2013-01-01 VITALS — BP 110/84 | Temp 98.1°F | Wt 167.0 lb

## 2013-01-01 DIAGNOSIS — R350 Frequency of micturition: Secondary | ICD-10-CM

## 2013-01-01 DIAGNOSIS — N760 Acute vaginitis: Secondary | ICD-10-CM | POA: Insufficient documentation

## 2013-01-01 LAB — POCT URINALYSIS DIPSTICK
Glucose, UA: NEGATIVE
Protein, UA: NEGATIVE
Spec Grav, UA: >=1.03
Urobilinogen, UA: 0.2

## 2013-01-01 NOTE — Progress Notes (Signed)
Chief Complaint  Patient presents with  . Urinary Frequency    and irriation started over the weekend     HPI:  Acute visit for Dysuria: -started saturday -a little vulvovag irritation and redness, dysuria, a little urinary frequency, a little vaginal discharge that she thinks is normal -has had UTI in the past and wanted to make sure does not have UTI -denies: fevers, chills, NVD, flank pain -she is not worried about STIs and does not want testing for these  ROS: See pertinent positives and negatives per HPI.  Past Medical History  Diagnosis Date  . Allergy     Rhinitis  . History of nephrolithiasis 1996  . Toxemia in pregnancy   . HELLP (hemolytic anemia/elev liver enzymes/low platelets in pregnancy)   . Osteopenia     Family History  Problem Relation Age of Onset  . Cancer Daughter     melanoma- 2011    History   Social History  . Marital Status: Married    Spouse Name: N/A    Number of Children: 2  . Years of Education: N/A   Social History Main Topics  . Smoking status: Never Smoker   . Smokeless tobacco: Never Used  . Alcohol Use: Yes     Comment: once a month or less  . Drug Use: No  . Sexually Active: Yes    Birth Control/ Protection: Pill   Other Topics Concern  . None   Social History Narrative  . None    Current outpatient prescriptions:b complex vitamins tablet, Take 1 tablet by mouth daily., Disp: , Rfl: ;  cetirizine (ZYRTEC) 10 MG tablet, Take 10 mg by mouth daily.  , Disp: , Rfl: ;  cyclobenzaprine (FLEXERIL) 5 MG tablet, Take 1 tablet (5 mg total) by mouth at bedtime as needed for muscle spasms., Disp: 30 tablet, Rfl: 1;  estradiol-norethindrone (COMBIPATCH) 0.05-0.14 MG/DAY, Place 1 patch onto the skin 2 (two) times a week., Disp: , Rfl:  LORazepam (ATIVAN) 0.5 MG tablet, Take 1 tablet (0.5 mg total) by mouth every 8 (eight) hours as needed for anxiety., Disp: 30 tablet, Rfl: 1;  pantoprazole (PROTONIX) 40 MG tablet, take 1 tablet by mouth  once daily, Disp: 90 tablet, Rfl: 1  EXAM:  Filed Vitals:   01/01/13 1449  BP: 110/84  Temp: 98.1 F (36.7 C)    Body mass index is 24.65 kg/(m^2).  GENERAL: vitals reviewed and listed above, alert, oriented, appears well hydrated and in no acute distress  HEENT: atraumatic, conjunttiva clear, no obvious abnormalities on inspection of external nose and ears  NECK: no obvious masses on inspection  LUNGS: clear to auscultation bilaterally, no wheezes, rales or rhonchi, good air movement  CV: HRRR, no peripheral edema  GU: mild vulvovag irritation, no abnormal discharge, normal exam otherwise with no CMT  MS: moves all extremities without noticeable abnormality  PSYCH: pleasant and cooperative, no obvious depression or anxiety  ASSESSMENT AND PLAN:  Discussed the following assessment and plan:  Urinary frequency - Plan: POCT urinalysis dipstick, Culture, Urine  Vaginitis and vulvovaginitis - Plan: Cervicovaginal ancillary only  -udip unremarkable, culture pending -white thick discharge on exam and suspect yeast versus BV. Wet prep pending. No CMT. Pt refused CTI testing.  Will call her with results. She will tx for yeast in the meantime. -Patient advised to return or notify a doctor immediately if symptoms worsen or persist or new concerns arise.  There are no Patient Instructions on file for this visit.  Colin Benton R.

## 2013-01-03 ENCOUNTER — Ambulatory Visit
Admission: RE | Admit: 2013-01-03 | Discharge: 2013-01-03 | Disposition: A | Discharge status: Home / Self Care | Payer: Managed Care, Other (non HMO) | Source: Ambulatory Visit | Attending: Obstetrics and Gynecology | Admitting: Obstetrics and Gynecology

## 2013-01-03 DIAGNOSIS — Z1231 Encounter for screening mammogram for malignant neoplasm of breast: Secondary | ICD-10-CM

## 2013-01-03 LAB — URINE CULTURE: Colony Count: 6000

## 2013-01-03 MED ORDER — METRONIDAZOLE 500 MG PO TABS: 500.0000 mg | ORAL_TABLET | Freq: Two times a day (BID) | ORAL | Status: DC

## 2013-01-03 NOTE — Addendum Note (Signed)
Addended by: Azucena Freed on: 01/03/2013 12:35 PM   Modules accepted: Orders

## 2013-01-03 NOTE — Progress Notes (Signed)
Quick Note:  Called and spoke with pt and pt is aware. Rx sent to [facility]. ______

## 2013-04-04 ENCOUNTER — Other Ambulatory Visit: Payer: Self-pay | Admitting: Obstetrics and Gynecology

## 2013-04-17 ENCOUNTER — Other Ambulatory Visit: Payer: Self-pay | Admitting: Family Medicine

## 2013-06-11 ENCOUNTER — Other Ambulatory Visit: Payer: Self-pay | Admitting: Family Medicine

## 2013-06-11 NOTE — Telephone Encounter (Signed)
Refill once 

## 2013-08-29 ENCOUNTER — Encounter: Payer: Self-pay | Admitting: Family Medicine

## 2013-08-29 ENCOUNTER — Ambulatory Visit (INDEPENDENT_AMBULATORY_CARE_PROVIDER_SITE_OTHER): Payer: Self-pay | Admitting: Family Medicine

## 2013-08-29 VITALS — BP 124/82 | HR 109 | Temp 98.0°F | Wt 167.0 lb

## 2013-08-29 DIAGNOSIS — J329 Chronic sinusitis, unspecified: Secondary | ICD-10-CM

## 2013-08-29 MED ORDER — AMOXICILLIN-POT CLAVULANATE 875-125 MG PO TABS: 1.0000 | ORAL_TABLET | Freq: Two times a day (BID) | ORAL | Status: DC

## 2013-08-29 MED ORDER — CYCLOBENZAPRINE HCL 5 MG PO TABS: 5.0000 mg | ORAL_TABLET | Freq: Every evening | ORAL | Status: DC | PRN

## 2013-08-29 NOTE — Patient Instructions (Signed)

## 2013-08-29 NOTE — Progress Notes (Signed)
Pre visit review using our clinic review tool, if applicable. No additional management support is needed unless otherwise documented below in the visit note. 

## 2013-08-29 NOTE — Progress Notes (Signed)
   Subjective:    Patient ID: Kelly Woodward, female    DOB: 10/20/1960, 53 y.o.   MRN: [id number]  Sinusitis Associated symptoms include congestion, coughing, sinus pressure and a sore throat. Pertinent negatives include no chills.      Review of Systems  Constitutional: Positive for fatigue. Negative for fever and chills.  HENT: Positive for congestion, sinus pressure and sore throat. Negative for ear discharge.   Respiratory: Positive for cough.        Objective:   Physical Exam        Assessment & Plan:

## 2013-08-29 NOTE — Progress Notes (Signed)
   Subjective:    Patient ID: Kelly Woodward, female    DOB: 23-Jan-1961, 53 y.o.   MRN: [id number]  Sinusitis Associated symptoms include congestion, sinus pressure and a sore throat. Pertinent negatives include no chills.   Patient seen for acute visit. She developed bacterial conjunctivitis and seen at local urgent care Monday. She was placed on Polytrim drops and that is improving  She describes recent viral-type symptoms. She was getting somewhat better than a few days ago developed greenish nasal discharge, intermittent headaches, facial pain and increased malaise. She's tried over-the-counter medications without much improvement. She's staying well-hydrated. Nonsmoker  Past Medical History  Diagnosis Date  . Allergy     Rhinitis  . History of nephrolithiasis 1996  . Toxemia in pregnancy   . HELLP (hemolytic anemia/elev liver enzymes/low platelets in pregnancy)   . Osteopenia    Past Surgical History  Procedure Laterality Date  . 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 She has never used smokeless tobacco. She reports that she drinks alcohol. She reports that she does not use illicit drugs. family history includes Cancer in her daughter. Allergies  Allergen Reactions  . Astelin     nosebleeds      Review of Systems  Constitutional: Positive for fatigue. Negative for fever and chills.  HENT: Positive for congestion, sinus pressure and sore throat.   Respiratory: Positive for choking.        Objective:   Physical Exam  Constitutional: She appears well-developed and well-nourished.  HENT:  Right Ear: External ear normal.  Left Ear: External ear normal.  Nose: Nose normal.  Mouth/Throat: Oropharynx is clear and moist.  Neck: Neck supple.  Cardiovascular: Normal rate.   Pulmonary/Chest: Effort normal and breath sounds normal. No respiratory distress. She has no wheezes. She has no rales.  Lymphadenopathy:    She has no cervical adenopathy.           Assessment & Plan:  Acute sinusitis. Start Augmentin 875 mg twice daily for 10 days.

## 2014-01-09 ENCOUNTER — Other Ambulatory Visit: Payer: Self-pay | Admitting: Family Medicine

## 2014-01-10 NOTE — Telephone Encounter (Signed)
Last filled 05/2013 for #30 Last seen 08/29/13 for Sinusitis Has no upcoming appt scheduled. Please advise.  Thanks!

## 2014-01-10 NOTE — Telephone Encounter (Signed)
Refill once OK. 

## 2014-02-26 LAB — HM PAP SMEAR: HM PAP: NORMAL

## 2014-04-05 ENCOUNTER — Encounter: Payer: Self-pay | Admitting: Family Medicine

## 2014-04-05 ENCOUNTER — Ambulatory Visit (INDEPENDENT_AMBULATORY_CARE_PROVIDER_SITE_OTHER): Payer: BC Managed Care – PPO | Admitting: Family Medicine

## 2014-04-05 VITALS — BP 130/80 | HR 112 | Temp 97.6°F | Wt 174.0 lb

## 2014-04-05 DIAGNOSIS — R319 Hematuria, unspecified: Secondary | ICD-10-CM

## 2014-04-05 DIAGNOSIS — M25519 Pain in unspecified shoulder: Secondary | ICD-10-CM

## 2014-04-05 DIAGNOSIS — M25511 Pain in right shoulder: Secondary | ICD-10-CM

## 2014-04-05 DIAGNOSIS — N3000 Acute cystitis without hematuria: Secondary | ICD-10-CM

## 2014-04-05 LAB — POCT URINALYSIS DIPSTICK
BILIRUBIN UA: NEGATIVE
GLUCOSE UA: NEGATIVE
KETONES UA: NEGATIVE
Nitrite, UA: NEGATIVE
Protein, UA: NEGATIVE
Spec Grav, UA: 1.015
Urobilinogen, UA: 0.2
pH, UA: 7

## 2014-04-05 MED ORDER — CEPHALEXIN 500 MG PO CAPS
500.0000 mg | ORAL_CAPSULE | Freq: Three times a day (TID) | ORAL | Status: DC
Start: 2014-04-05 — End: 2014-04-18

## 2014-04-05 MED ORDER — DICLOFENAC SODIUM 75 MG PO TBEC: 75.0000 mg | DELAYED_RELEASE_TABLET | Freq: Two times a day (BID) | ORAL | Status: DC

## 2014-04-05 NOTE — Patient Instructions (Signed)

## 2014-04-05 NOTE — Progress Notes (Signed)
   Subjective:    Patient ID: Kelly Woodward, female    DOB: 11-19-1960, 53 y.o.   MRN: [id number]  Dysuria  Associated symptoms include frequency. Pertinent negatives include no chills, nausea or vomiting.   Patient seen for the following issues  Dysuria. Onset yesterday. Urine frequency and burning with urination. No gross hematuria. No fevers or chills. Denies any flank pain. History of frequent bacterial vaginosis in the past. Recently completed metronidazole per gynecologist. These symptoms are different.  Right shoulder pain. Similar pains in the past. No injury. Frequently lifts with her work. Pain with external rotation and internal rotation. Poorly localized right shoulder. Some radiation toward deltoid. Occasional might pain. Improved in the past with diclofenac. No weakness. No radiculopathy pains. No neck pain.  Past Medical History  Diagnosis Date  . Allergy     Rhinitis  . History of nephrolithiasis 1996  . Toxemia in pregnancy   . HELLP (hemolytic anemia/elev liver enzymes/low platelets in pregnancy)   . Osteopenia    Past Surgical History  Procedure Laterality Date  . 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 She has never used smokeless tobacco. She reports that she drinks alcohol. She reports that she does not use illicit drugs. family history includes Cancer in her daughter. Allergies  Allergen Reactions  . Astelin     nosebleeds        Review of Systems  Constitutional: Negative for fever, chills and appetite change.  Gastrointestinal: Negative for nausea, vomiting, abdominal pain, diarrhea and constipation.  Genitourinary: Positive for dysuria and frequency.  Musculoskeletal: Negative for back pain.  Neurological: Negative for dizziness.       Objective:   Physical Exam  Constitutional: She appears well-developed and well-nourished.  HENT:  Head: Normocephalic and atraumatic.  Neck: Neck supple. No thyromegaly present.    Cardiovascular: Normal rate, regular rhythm and normal heart sounds.   Pulmonary/Chest: Breath sounds normal.  Abdominal: Soft. Bowel sounds are normal. There is no tenderness.  Musculoskeletal:  Right shoulder reveals full range of motion. No localized tenderness. She has pain with external rotation and internal rotation. No pain with abduction          Assessment & Plan:  #1 uncomplicated cystitis. Urine culture sent. Keflex 500 mg 3 times a day for 5 days pending results. Increase fluids #2 right shoulder pain. Suspect rotator cuff tendinitis/bursitis. Refill diclofenac 75 mg twice a day with food as needed. Consider corticosteroid injection if not improving in the next few weeks

## 2014-04-05 NOTE — Progress Notes (Signed)
Pre visit review using our clinic review tool, if applicable. No additional management support is needed unless otherwise documented below in the visit note. 

## 2014-04-07 LAB — URINE CULTURE

## 2014-04-12 ENCOUNTER — Telehealth: Payer: Self-pay | Admitting: Family Medicine

## 2014-04-12 MED ORDER — AMPICILLIN 500 MG PO CAPS: 500.0000 mg | ORAL_CAPSULE | Freq: Three times a day (TID) | ORAL | Status: DC

## 2014-04-12 NOTE — Telephone Encounter (Signed)
Ampicillin 500 mg po tid for 5 days. Repeat urine cx if not resolved after that.

## 2014-04-12 NOTE — Telephone Encounter (Signed)
Rx sent to pharmacy and left detailed message on VM

## 2014-04-12 NOTE — Telephone Encounter (Signed)
Pt seen for uti 9/11.  Pt states she is still having symptoms and the dr stated he may have to send another rx if this did not clear up and it has not  [facility]/ [address]

## 2014-04-12 NOTE — Telephone Encounter (Signed)
Pt stated that she is still having the frequency and burning.

## 2014-04-18 ENCOUNTER — Ambulatory Visit (INDEPENDENT_AMBULATORY_CARE_PROVIDER_SITE_OTHER): Payer: BC Managed Care – PPO | Admitting: Family Medicine

## 2014-04-18 ENCOUNTER — Encounter: Payer: Self-pay | Admitting: Family Medicine

## 2014-04-18 VITALS — BP 132/80 | HR 96 | Temp 98.6°F | Wt 172.0 lb

## 2014-04-18 DIAGNOSIS — R300 Dysuria: Secondary | ICD-10-CM

## 2014-04-18 LAB — POCT URINALYSIS DIPSTICK
Glucose, UA: NEGATIVE
Nitrite, UA: NEGATIVE
PROTEIN UA: NEGATIVE
Spec Grav, UA: 1.01
UROBILINOGEN UA: 4
pH, UA: 5.5

## 2014-04-18 NOTE — Patient Instructions (Signed)
Try over the counter Pyridium for symptom relief Stay well hydrated. We will call you with urine culture results Follow up promptly for any fever, nausea, or vomiting.

## 2014-04-18 NOTE — Progress Notes (Signed)
   Subjective:    Patient ID: Kelly Woodward, female    DOB: [birth year], 53 y.o.   MRN: [id number]  Dysuria  Associated symptoms include frequency. Pertinent negatives include no chills, flank pain or hematuria.   Patient seen with some persistent dysuria. She was seen recently with dysuria and urine culture was sent which came back positive for group B strep. She was initially treated with Keflex and then subsequently ampicillin. She did not take ampicillin initially as prescribed and apparently took a friend's sulfa antibiotic but did not see improvement. She then subsequently started ampicillin but continues to have some urine frequency and occasional burning with urination. Denies any flank pain. No nausea or vomiting. Increased fatigue. No hematuria. She does have history of kidney stones previously but has not had any severe pain recently.  Past Medical History  Diagnosis Date  . Allergy     Rhinitis  . History of nephrolithiasis 1996  . Toxemia in pregnancy   . HELLP (hemolytic anemia/elev liver enzymes/low platelets in pregnancy)   . Osteopenia    Past Surgical History  Procedure Laterality Date  . 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 She has never used smokeless tobacco. She reports that she drinks alcohol. She reports that she does not use illicit drugs. family history includes Cancer in her daughter. Allergies  Allergen Reactions  . Astelin     nosebleeds      Review of Systems  Constitutional: Positive for fatigue. Negative for fever and chills.  Genitourinary: Positive for dysuria and frequency. Negative for hematuria and flank pain.       Objective:   Physical Exam  Constitutional: She appears well-developed and well-nourished. No distress.  Cardiovascular: Normal rate and regular rhythm.   Pulmonary/Chest: Effort normal and breath sounds normal. No respiratory distress. She has no wheezes. She has no rales.  Musculoskeletal:  No  flank tenderness          Assessment & Plan:  Persistent dysuria. Rule out recurrent/persistent UTI. She grew out group B strep which should have been sensitive to ampicillin and is not improved with ampicillin. Urine dipstick today is equivocal. Send urine culture. No further antibiotics unless this is positive or if she has any new symptoms such as fever. She'll try over-the-counter peridium in the meantime.

## 2014-04-20 LAB — URINE CULTURE
COLONY COUNT: NO GROWTH
Organism ID, Bacteria: NO GROWTH

## 2014-05-02 ENCOUNTER — Other Ambulatory Visit: Payer: Self-pay | Admitting: Family Medicine

## 2014-05-02 NOTE — Telephone Encounter (Signed)
Last visit 04/18/14 Last refill 01/11/14 #30 0 refill

## 2014-05-02 NOTE — Telephone Encounter (Signed)
Refill once.  Avoid regular use. 

## 2014-05-21 ENCOUNTER — Ambulatory Visit (INDEPENDENT_AMBULATORY_CARE_PROVIDER_SITE_OTHER): Payer: BC Managed Care – PPO | Admitting: Family Medicine

## 2014-05-21 ENCOUNTER — Encounter: Payer: Self-pay | Admitting: Family Medicine

## 2014-05-21 VITALS — BP 123/89 | HR 104 | Temp 98.8°F | Ht 69.0 in | Wt 170.0 lb

## 2014-05-21 DIAGNOSIS — J209 Acute bronchitis, unspecified: Secondary | ICD-10-CM

## 2014-05-21 MED ORDER — AMOXICILLIN-POT CLAVULANATE 875-125 MG PO TABS: 1.0000 | ORAL_TABLET | Freq: Two times a day (BID) | ORAL | Status: DC

## 2014-05-21 MED ORDER — BENZONATATE 200 MG PO CAPS: 200.0000 mg | ORAL_CAPSULE | Freq: Three times a day (TID) | ORAL | Status: DC | PRN

## 2014-05-21 NOTE — Progress Notes (Signed)
Pre visit review using our clinic review tool, if applicable. No additional management support is needed unless otherwise documented below in the visit note. 

## 2014-05-21 NOTE — Progress Notes (Signed)
   Subjective:    Patient ID: Kelly Woodward, female    DOB: 1960/08/26, 53 y.o.   MRN: [id number]  HPI Here for one week of chest tightness and coughing up green sputum. No fever. On Mucinex    Review of Systems  Constitutional: Negative.   HENT: Positive for congestion. Negative for postnasal drip and sinus pressure.   Eyes: Negative.   Respiratory: Positive for cough, chest tightness and wheezing.        Objective:   Physical Exam  Constitutional: She appears well-developed and well-nourished.  HENT:  Right Ear: External ear normal.  Left Ear: External ear normal.  Nose: Nose normal.  Mouth/Throat: Oropharynx is clear and moist.  Eyes: Conjunctivae are normal.  Pulmonary/Chest: Effort normal. She has no rales.  Scattered rhonchi and wheezes   Lymphadenopathy:    She has no cervical adenopathy.          Assessment & Plan:  Given Augmentin and benzonatate for cough

## 2014-05-27 ENCOUNTER — Encounter: Payer: Self-pay | Admitting: Family Medicine

## 2014-06-06 ENCOUNTER — Other Ambulatory Visit: Payer: Self-pay | Admitting: Family Medicine

## 2014-09-10 ENCOUNTER — Telehealth: Payer: Self-pay | Admitting: Family Medicine

## 2014-09-10 NOTE — Telephone Encounter (Signed)
Last visit 04/18/14 Last refill 05/03/14 #30 0 refill

## 2014-09-11 NOTE — Telephone Encounter (Signed)
Refill once 

## 2014-09-13 ENCOUNTER — Telehealth: Payer: Self-pay | Admitting: Family Medicine

## 2014-09-13 NOTE — Telephone Encounter (Signed)
Pt request refill LORazepam (ATIVAN) 0.5 MG tablet [facility]/ [address]

## 2014-09-13 NOTE — Telephone Encounter (Signed)
Rx called in to pharmacy. 

## 2014-10-20 ENCOUNTER — Telehealth: Payer: Self-pay | Admitting: Diagnostic Neuroimaging

## 2014-10-20 NOTE — Telephone Encounter (Signed)
ERROR

## 2014-10-25 ENCOUNTER — Telehealth: Payer: Self-pay | Admitting: Family Medicine

## 2014-10-25 MED ORDER — PANTOPRAZOLE SODIUM 40 MG PO TBEC: 40.0000 mg | DELAYED_RELEASE_TABLET | Freq: Every day | ORAL | Status: DC

## 2014-10-25 NOTE — Telephone Encounter (Signed)
Rx sent to pharmacy   

## 2014-10-25 NOTE — Telephone Encounter (Signed)
Pt needs new rx pantoprazole 40 mg #90 call into [facility]

## 2014-11-07 ENCOUNTER — Encounter: Payer: Self-pay | Admitting: Family Medicine

## 2014-11-07 ENCOUNTER — Ambulatory Visit (INDEPENDENT_AMBULATORY_CARE_PROVIDER_SITE_OTHER): Payer: BLUE CROSS/BLUE SHIELD | Admitting: Family Medicine

## 2014-11-07 VITALS — BP 130/80 | HR 107 | Temp 98.6°F | Wt 169.0 lb

## 2014-11-07 DIAGNOSIS — B349 Viral infection, unspecified: Secondary | ICD-10-CM | POA: Diagnosis not present

## 2014-11-07 NOTE — Progress Notes (Signed)
Pre visit review using our clinic review tool, if applicable. No additional management support is needed unless otherwise documented below in the visit note. 

## 2014-11-07 NOTE — Progress Notes (Signed)
   Subjective:    Patient ID: Kelly Woodward, female    DOB: 1960/11/17, 54 y.o.   MRN: [id number]  HPI Patient is seen with cough and nasal congestion. Onset about 4 days ago. She had fever for 3 days straight of up to 101 but no fever now for 24 hours. This morning she woke up with whitish coating on her tongue and was concerned this may be thrush. No sore throat. No recent antibiotic or prednisone use. No history of diabetes. Cough is mostly dry. She's had some mild body aches which are improving.  Past Medical History  Diagnosis Date  . Allergy     Rhinitis  . History of nephrolithiasis 1996  . Toxemia in pregnancy   . HELLP (hemolytic anemia/elev liver enzymes/low platelets in pregnancy)   . Osteopenia    Past Surgical History  Procedure Laterality Date  . 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 She has never used smokeless tobacco. She reports that she drinks alcohol. She reports that she does not use illicit drugs. family history includes Cancer in her daughter. Allergies  Allergen Reactions  . Astelin     nosebleeds      Review of Systems  Constitutional: Positive for fatigue. Negative for chills.  HENT: Positive for congestion.   Respiratory: Positive for cough.        Objective:   Physical Exam  Constitutional: She appears well-developed and well-nourished. No distress.  HENT:  Head: Normocephalic and atraumatic.  Right Ear: External ear normal.  Mouth/Throat: Oropharynx is clear and moist.  She has somewhat prominent papillae on dorsum of tongue but no evidence whatsoever for thrush. Posterior pharynx is normal with no erythema. No exudate.  Neck: Neck supple.  Cardiovascular: Normal rate and regular rhythm.   Pulmonary/Chest: Effort normal and breath sounds normal. No respiratory distress. She has no wheezes. She has no rales.  Lymphadenopathy:    She has no cervical adenopathy.          Assessment & Plan:  Resolving viral  syndrome. No evidence for thrush. Reassurance. Continue over-the-counter cough medications as needed

## 2014-11-07 NOTE — Patient Instructions (Signed)

## 2014-12-09 ENCOUNTER — Other Ambulatory Visit: Payer: Self-pay | Admitting: Family Medicine

## 2014-12-10 ENCOUNTER — Ambulatory Visit (INDEPENDENT_AMBULATORY_CARE_PROVIDER_SITE_OTHER): Payer: BLUE CROSS/BLUE SHIELD | Admitting: Family Medicine

## 2014-12-10 ENCOUNTER — Encounter: Payer: Self-pay | Admitting: Family Medicine

## 2014-12-10 VITALS — BP 114/74 | HR 87 | Temp 98.5°F | Ht 69.0 in | Wt 168.0 lb

## 2014-12-10 DIAGNOSIS — M25511 Pain in right shoulder: Secondary | ICD-10-CM

## 2014-12-10 NOTE — Progress Notes (Signed)
   Subjective:    Patient ID: Kelly Woodward, female    DOB: [birth year], 54 y.o.   MRN: [id number]  HPI Here for right shoulder pain. She has a 5 year hx of anterior right shoulder pain for which she saw orthopedics. She was diagnosed with bursitis and this resolved with conservative treatments. However she has developed a different type of pain over the past year which can shoot up into the neck or it can shoot down the arm. Sometimes she feels tingling or numbness in the right hand. Aleve or Diclofenac help a little. Then last night while walking her dogs she tripped over one of them and fell forward, landing on the right elbow. She bruised the elbow and the right cheek and she iced these areas last night. Then when she awoke this am she had more pain in the right shoulder which shoots down the arm.    Review of Systems  Constitutional: Negative.   Musculoskeletal: Positive for arthralgias.       Objective:   Physical Exam  Constitutional: She appears well-developed and well-nourished. No distress.  Musculoskeletal:  Very tender over the right anterior shoulder. No crepitus. She can actively raise her arm above her head. She has a lot of pain on internal rotation of the arm.           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 She will use ice and Aleve. We will refer her back to Orthopedics.

## 2014-12-10 NOTE — Telephone Encounter (Signed)
Refill OK

## 2014-12-10 NOTE — Telephone Encounter (Signed)
Last visit 11/07/14 Last refill 09/13/14 #30 0 refill

## 2014-12-10 NOTE — Progress Notes (Signed)
Pre visit review using our clinic review tool, if applicable. No additional management support is needed unless otherwise documented below in the visit note. 

## 2014-12-20 ENCOUNTER — Telehealth: Payer: Self-pay | Admitting: Family Medicine

## 2014-12-20 MED ORDER — LORAZEPAM 0.5 MG PO TABS: ORAL_TABLET | ORAL | Status: DC

## 2014-12-20 NOTE — Telephone Encounter (Signed)
Pt request refill of the following: LORazepam (ATIVAN) 0.5 MG tablet   Phamacy: [facility] [address]

## 2014-12-20 NOTE — Addendum Note (Signed)
Addended by: Marcina Millard on: 12/20/2014 02:16 PM   Modules accepted: Orders

## 2014-12-20 NOTE — Telephone Encounter (Signed)
Last visit 11/07/14 Last refill 12/10/14 #30 0 refill

## 2014-12-20 NOTE — Telephone Encounter (Signed)
Just filled 10 days ago.  Would not be taking regularly and especially do not escalate dose.  If anxiety symptoms increasing, we need to get back in to look at better options of managing.

## 2014-12-20 NOTE — Telephone Encounter (Signed)
Called pharamcy and they stated that patient has not received a refill on Rx since 09/13/14. Informed Dr. B and per Dr. B request it is okay to call in refill. Pt is aware that Rx is called into pharmacy.

## 2015-02-13 ENCOUNTER — Encounter (HOSPITAL_BASED_OUTPATIENT_CLINIC_OR_DEPARTMENT_OTHER): Payer: Self-pay | Admitting: Emergency Medicine

## 2015-02-13 ENCOUNTER — Emergency Department (HOSPITAL_BASED_OUTPATIENT_CLINIC_OR_DEPARTMENT_OTHER): Payer: BLUE CROSS/BLUE SHIELD

## 2015-02-13 ENCOUNTER — Emergency Department (HOSPITAL_BASED_OUTPATIENT_CLINIC_OR_DEPARTMENT_OTHER)
Admission: EM | Admit: 2015-02-13 | Discharge: 2015-02-13 | Disposition: A | Discharge status: Home / Self Care | Payer: BLUE CROSS/BLUE SHIELD | Attending: Emergency Medicine | Admitting: Emergency Medicine

## 2015-02-13 DIAGNOSIS — Z8739 Personal history of other diseases of the musculoskeletal system and connective tissue: Secondary | ICD-10-CM | POA: Insufficient documentation

## 2015-02-13 DIAGNOSIS — Z79899 Other long term (current) drug therapy: Secondary | ICD-10-CM | POA: Insufficient documentation

## 2015-02-13 DIAGNOSIS — Z87442 Personal history of urinary calculi: Secondary | ICD-10-CM | POA: Insufficient documentation

## 2015-02-13 DIAGNOSIS — R109 Unspecified abdominal pain: Secondary | ICD-10-CM

## 2015-02-13 DIAGNOSIS — R1030 Lower abdominal pain, unspecified: Secondary | ICD-10-CM | POA: Diagnosis present

## 2015-02-13 DIAGNOSIS — Z3202 Encounter for pregnancy test, result negative: Secondary | ICD-10-CM | POA: Insufficient documentation

## 2015-02-13 DIAGNOSIS — Z8709 Personal history of other diseases of the respiratory system: Secondary | ICD-10-CM | POA: Diagnosis not present

## 2015-02-13 DIAGNOSIS — N939 Abnormal uterine and vaginal bleeding, unspecified: Secondary | ICD-10-CM | POA: Insufficient documentation

## 2015-02-13 DIAGNOSIS — N390 Urinary tract infection, site not specified: Secondary | ICD-10-CM | POA: Diagnosis not present

## 2015-02-13 LAB — COMPREHENSIVE METABOLIC PANEL
ALT: 13 U/L — ABNORMAL LOW (ref 14–54)
AST: 16 U/L (ref 15–41)
Albumin: 4.1 g/dL (ref 3.5–5.0)
Alkaline Phosphatase: 64 U/L (ref 38–126)
Anion gap: 9 (ref 5–15)
BUN: 25 mg/dL — AB (ref 6–20)
CO2: 24 mmol/L (ref 22–32)
Calcium: 9.1 mg/dL (ref 8.9–10.3)
Chloride: 104 mmol/L (ref 101–111)
Creatinine, Ser: 0.71 mg/dL (ref 0.44–1.00)
GFR calc non Af Amer: >60 mL/min (ref >60)
Glucose, Bld: 96 mg/dL (ref 65–99)
POTASSIUM: 3.5 mmol/L (ref 3.5–5.1)
Sodium: 137 mmol/L (ref 135–145)
TOTAL PROTEIN: 7 g/dL (ref 6.5–8.1)
Total Bilirubin: 0.7 mg/dL (ref 0.3–1.2)

## 2015-02-13 LAB — I-STAT CG4 LACTIC ACID, ED
Lactic Acid, Venous: 0.57 mmol/L (ref 0.5–2.0)
Lactic Acid, Venous: 1.22 mmol/L (ref 0.5–2.0)

## 2015-02-13 LAB — CBC WITH DIFFERENTIAL/PLATELET
BASOS ABS: 0 10*3/uL (ref 0.0–0.1)
Basophils Relative: 0 % (ref 0–1)
EOS ABS: 0.1 10*3/uL (ref 0.0–0.7)
Eosinophils Relative: 1 % (ref 0–5)
HCT: 37.2 % (ref 36.0–46.0)
Hemoglobin: 12.2 g/dL (ref 12.0–15.0)
Lymphocytes Relative: 17 % (ref 12–46)
Lymphs Abs: 1.5 10*3/uL (ref 0.7–4.0)
MCH: 30.2 pg (ref 26.0–34.0)
MCHC: 32.8 g/dL (ref 30.0–36.0)
MCV: 92.1 fL (ref 78.0–100.0)
MONO ABS: 0.6 10*3/uL (ref 0.1–1.0)
Monocytes Relative: 7 % (ref 3–12)
NEUTROS PCT: 75 % (ref 43–77)
Neutro Abs: 6.6 10*3/uL (ref 1.7–7.7)
Platelets: 329 10*3/uL (ref 150–400)
RBC: 4.04 MIL/uL (ref 3.87–5.11)
RDW: 13.6 % (ref 11.5–15.5)
WBC: 8.8 10*3/uL (ref 4.0–10.5)

## 2015-02-13 LAB — URINALYSIS, ROUTINE W REFLEX MICROSCOPIC
Bilirubin Urine: NEGATIVE
Glucose, UA: NEGATIVE mg/dL
HGB URINE DIPSTICK: NEGATIVE
Ketones, ur: NEGATIVE mg/dL
NITRITE: NEGATIVE
PH: 6 (ref 5.0–8.0)
Protein, ur: NEGATIVE mg/dL
SPECIFIC GRAVITY, URINE: 1.026 (ref 1.005–1.030)
UROBILINOGEN UA: 1 mg/dL (ref 0.0–1.0)

## 2015-02-13 LAB — URINE MICROSCOPIC-ADD ON

## 2015-02-13 LAB — LIPASE, BLOOD: LIPASE: 19 U/L — AB (ref 22–51)

## 2015-02-13 LAB — PREGNANCY, URINE: PREG TEST UR: NEGATIVE

## 2015-02-13 MED ORDER — CEFTRIAXONE SODIUM 1 G IJ SOLR
INTRAMUSCULAR | Status: AC
  Filled 2015-02-13: qty 10

## 2015-02-13 MED ORDER — SODIUM CHLORIDE 0.9 % IV BOLUS (SEPSIS)
1000.0000 mL | Freq: Once | INTRAVENOUS | Status: AC
  Administered 2015-02-13: 1000 mL via INTRAVENOUS

## 2015-02-13 MED ORDER — CEPHALEXIN 500 MG PO CAPS: 500.0000 mg | ORAL_CAPSULE | Freq: Four times a day (QID) | ORAL | Status: DC

## 2015-02-13 MED ORDER — IOHEXOL 300 MG/ML  SOLN
100.0000 mL | Freq: Once | INTRAMUSCULAR | Status: AC | PRN
  Administered 2015-02-13: 100 mL via INTRAVENOUS

## 2015-02-13 MED ORDER — ONDANSETRON HCL 4 MG PO TABS
4.0000 mg | ORAL_TABLET | Freq: Four times a day (QID) | ORAL | Status: DC
Start: 2015-02-13 — End: 2015-08-27

## 2015-02-13 MED ORDER — IOHEXOL 300 MG/ML  SOLN
25.0000 mL | Freq: Once | INTRAMUSCULAR | Status: AC | PRN
  Administered 2015-02-13: 25 mL via ORAL

## 2015-02-13 MED ORDER — DEXTROSE 5 % IV SOLN
1.0000 g | Freq: Once | INTRAVENOUS | Status: AC
  Administered 2015-02-13: 1 g via INTRAVENOUS

## 2015-02-13 NOTE — Discharge Instructions (Signed)
Urinary Tract Infection Take antibiotics as prescribed. Follow-up with your doctor. Return to the ED if you develop worsening pain, fever, vomiting or any other concerns. Urinary tract infections (UTIs) can develop anywhere along your urinary tract. Your urinary tract is your body's drainage system for removing wastes and extra water. Your urinary tract includes two kidneys, two ureters, a bladder, and a urethra. Your kidneys are a pair of bean-shaped organs. Each kidney is about the size of your fist. They are located below your ribs, one on each side of your spine. CAUSES Infections are caused by microbes, which are microscopic organisms, including fungi, viruses, and bacteria. These organisms are so small that they can only be seen through a microscope. Bacteria are the microbes that most commonly cause UTIs. SYMPTOMS  Symptoms of UTIs may vary by age and gender of the patient and by the location of the infection. Symptoms in young women typically include a frequent and intense urge to urinate and a painful, burning feeling in the bladder or urethra during urination. Older women and men are more likely to be tired, shaky, and weak and have muscle aches and abdominal pain. A fever may mean the infection is in your kidneys. Other symptoms of a kidney infection include pain in your back or sides below the ribs, nausea, and vomiting. DIAGNOSIS To diagnose a UTI, your caregiver will ask you about your symptoms. Your caregiver also will ask to provide a urine sample. The urine sample will be tested for bacteria and white blood cells. White blood cells are made by your body to help fight infection. TREATMENT  Typically, UTIs can be treated with medication. Because most UTIs are caused by a bacterial infection, they usually can be treated with the use of antibiotics. The choice of antibiotic and length of treatment depend on your symptoms and the type of bacteria causing your infection. HOME CARE  INSTRUCTIONS  If you were prescribed antibiotics, take them exactly as your caregiver instructs you. Finish the medication even if you feel better after you have only taken some of the medication.  Drink enough water and fluids to keep your urine clear or pale yellow.  Avoid caffeine, tea, and carbonated beverages. They tend to irritate your bladder.  Empty your bladder often. Avoid holding urine for long periods of time.  Empty your bladder before and after sexual intercourse.  After a bowel movement, women should cleanse from front to back. Use each tissue only once. SEEK MEDICAL CARE IF:   You have back pain.  You develop a fever.  Your symptoms do not begin to resolve within 3 days. SEEK IMMEDIATE MEDICAL CARE IF:   You have severe back pain or lower abdominal pain.  You develop chills.  You have nausea or vomiting.  You have continued burning or discomfort with urination. MAKE SURE YOU:   Understand these instructions.  Will watch your condition.  Will get help right away if you are not doing well or get worse. Document Released: 04/21/2005 Document Revised: 01/11/2012 Document Reviewed: 08/20/2011 [facility] Patient Information 2015 [place], [place]. This information is not intended to replace advice given to you by your health care provider. Make sure you discuss any questions you have with your health care provider.

## 2015-02-13 NOTE — ED Provider Notes (Signed)
CSN: [id number]     Arrival date & time 02/13/15  1525 History   First MD Initiated Contact with Patient 02/13/15 1538     Chief Complaint  Patient presents with  . Flank Pain     (Consider location/radiation/quality/duration/timing/severity/associated sxs/prior Treatment) HPI Comments: Patient reports one week history of right sided lower abdominal and low back pain without trauma. Pain started in right low back and right flank and radiated to right lower abdomen. Pain has been coming and going all week. Associated with poor appetite and nausea. No vomiting. Patient with history of kidney stones many years ago and thought that was related. She saw her gynecologist 2 days ago and had a normal pelvic exam a normal pelvic ultrasound. She reports today the pain is gotten worse and she had a fever at home of 100. Denies any constipation or diarrhea. Denies any dysuria hematuria. Had 2 days of vaginal bleeding last week but her periods are regular. No previous abdominal surgeries. No radiation of pain down the leg. No weakness numbness or tingling.  The history is provided by the patient.    Past Medical History  Diagnosis Date  . Allergy     Rhinitis  . History of nephrolithiasis 1996  . Osteopenia    Past Surgical History  Procedure Laterality Date  . Cyst on vocal chord  1998   Family History  Problem Relation Age of Onset  . Cancer Daughter     melanoma- 2011   History  Substance Use Topics  . Smoking status: Never Smoker   . Smokeless tobacco: Never Used  . Alcohol Use: 0.0 oz/week    0 Standard drinks or equivalent per week     Comment: once a month or less   OB History    Gravida Para Term Preterm AB TAB SAB Ectopic Multiple Living   8         2     Review of Systems  Constitutional: Positive for fever, activity change and appetite change.  HENT: Negative for congestion and rhinorrhea.   Respiratory: Negative for cough, chest tightness and shortness of breath.    Cardiovascular: Negative for chest pain.  Gastrointestinal: Positive for nausea and abdominal pain. Negative for vomiting and diarrhea.  Genitourinary: Positive for flank pain, vaginal bleeding and pelvic pain. Negative for dysuria, hematuria and difficulty urinating.  Musculoskeletal: Positive for myalgias and back pain. Negative for arthralgias and neck pain.  Skin: Negative for rash.  Neurological: Negative for weakness, numbness and headaches.  A complete 10 system review of systems was obtained and all systems are negative except as noted in the HPI and PMH.      Allergies  Astelin  Home Medications   Prior to Admission medications   Medication Sig Start Date End Date Taking? Authorizing Provider  b complex vitamins tablet Take 1 tablet by mouth daily.    Historical Provider, MD  cephALEXin (KEFLEX) 500 MG capsule Take 1 capsule (500 mg total) by mouth 4 (four) times daily. 02/13/15   Ezequiel Essex, MD  cetirizine (ZYRTEC) 10 MG tablet Take 10 mg by mouth daily.      Historical Provider, MD  estradiol-norethindrone [facility]) 0.05-0.14 MG/DAY Place 1 patch onto the skin 2 (two) times a week.    Historical Provider, MD  LORazepam (ATIVAN) 0.5 MG tablet take 1 tablet by mouth every 8 hours if needed 12/20/14   Eulas Post, MD  ondansetron (ZOFRAN) 4 MG tablet Take 1 tablet (4 mg total) by  mouth every 6 (six) hours. 02/13/15   Ezequiel Essex, MD  pantoprazole (PROTONIX) 40 MG tablet Take 1 tablet (40 mg total) by mouth daily. 10/25/14   Eulas Post, MD   BP 130/72 mmHg  Pulse 83  Temp(Src) 98.1 F (36.7 C) (Oral)  Resp 16  Ht 5\' 9"  (1.753 m)  Wt 172 lb (78.019 kg)  BMI 25.39 kg/m2  SpO2 100%  LMP 02/06/2015 (Exact Date) Physical Exam  Constitutional: She is oriented to person, place, and time. She appears well-developed and well-nourished. No distress.  HENT:  Head: Normocephalic and atraumatic.  Mouth/Throat: Oropharynx is clear and moist. No oropharyngeal  exudate.  Eyes: Conjunctivae and EOM are normal. Pupils are equal, round, and reactive to light.  Neck: Normal range of motion. Neck supple.  No meningismus.  Cardiovascular: Normal rate, regular rhythm, normal heart sounds and intact distal pulses.   No murmur heard. Pulmonary/Chest: Effort normal and breath sounds normal. No respiratory distress.  Abdominal: Soft. There is tenderness. There is no rebound and no guarding.  TTP RLQ, no guarding or rebound  Musculoskeletal: Normal range of motion. She exhibits tenderness. She exhibits no edema.  R CVAT  Neurological: She is alert and oriented to person, place, and time. No cranial nerve deficit. She exhibits normal muscle tone. Coordination normal.  No ataxia on finger to nose bilaterally. No pronator drift. 5/5 strength throughout. CN 2-12 intact. Negative Romberg. Equal grip strength. Sensation intact. Gait is normal.   Skin: Skin is warm.  Psychiatric: She has a normal mood and affect. Her behavior is normal.  Nursing note and vitals reviewed.   ED Course  Procedures (including critical care time) Labs Review Labs Reviewed  URINALYSIS, ROUTINE W REFLEX MICROSCOPIC (NOT AT [facility]) - Abnormal; Notable for the following:    Leukocytes, UA MODERATE (*)    All other components within normal limits  COMPREHENSIVE METABOLIC PANEL - Abnormal; Notable for the following:    BUN 25 (*)    ALT 13 (*)    All other components within normal limits  LIPASE, BLOOD - Abnormal; Notable for the following:    Lipase 19 (*)    All other components within normal limits  URINE CULTURE  PREGNANCY, URINE  URINE MICROSCOPIC-ADD ON  CBC WITH DIFFERENTIAL/PLATELET  I-STAT CG4 LACTIC ACID, ED  I-STAT CG4 LACTIC ACID, ED    Imaging Review Ct Abdomen Pelvis W Contrast  02/13/2015   CLINICAL DATA:  54 year old female with right lower quadrant abdominal pain.  EXAM: CT ABDOMEN AND PELVIS WITH CONTRAST  TECHNIQUE: Multidetector CT imaging of the abdomen and  pelvis was performed using the standard protocol following bolus administration of intravenous contrast.  CONTRAST:  28mL OMNIPAQUE IOHEXOL 300 MG/ML SOLN, 156mL OMNIPAQUE IOHEXOL 300 MG/ML SOLN  COMPARISON:  CT dated 05/28/2004  FINDINGS: The visualized lung bases are clear. No intra-abdominal free air or free fluid.  The liver, gallbladder, pancreas, spleen, adrenal glands, kidneys, visualized ureters, and urinary bladder appear unremarkable. The uterus and the ovaries are grossly unremarkable.  Moderate stool throughout the colon with no evidence of bowel obstruction or inflammation. Normal appendix.  The visualized abdominal aorta and IVC are patent. No portal venous gas identified. There is history aortic left renal vein variant anatomy. There is no lymphadenopathy. Small fat containing umbilical hernia. Mild degenerative changes of the spine. Grade 1 L2 on L3 retrolisthesis.  IMPRESSION: No acute intra-abdominal or pelvic pathology. No evidence of bowel obstruction or inflammation. Normal appendix.   Electronically Signed  By: Anner Crete M.D.   On: 02/13/2015 17:20     EKG Interpretation None      MDM   Final diagnoses:  Urinary tract infection without hematuria, site unspecified  Abdominal pain, unspecified abdominal location   Flank and lower abdominal pain for the past one week. History of kidney stones. Recent gynecological workup was negative including ultrasound.  Consider UTI, kidney stone, appendicitis, pyelonephritis. Patient with low-grade temperature on arrival.  Urinalysis shows leukocytes with white blood cells. Labs unremarkable. Normal lactate.  CT shows normal appendix. No evidence of kidney stone. Patient with recent pelvic exam including ultrasound and declines repeat exam today.  We'll treat for possible UTI. May have also passed a kidney stone.  Tolerating by mouth in the ED. Rocephin given. Urine culture will be sent. Patient started on Keflex. Follow-up  with PCP. Return precautions discussed.  BP 130/72 mmHg  Pulse 83  Temp(Src) 98.1 F (36.7 C) (Oral)  Resp 16  Ht 5\' 9"  (1.753 m)  Wt 172 lb (78.019 kg)  BMI 25.39 kg/m2  SpO2 100%  LMP 02/06/2015 (Exact Date)    Ezequiel Essex, MD 02/13/15 Einar Crow

## 2015-02-13 NOTE — ED Notes (Signed)
Patient transported to CT 

## 2015-02-13 NOTE — ED Notes (Signed)
Right flank pain radiating to RLQ 7 days ago intermittently. Saw obgyn and had OB US to r/o Ovarian cyst.   They expressed concern about appendix. Pt flies to [place] tomorrow and the pain has not gone away so she wants to be checked.  Hx of kidney stones many years ago.

## 2015-02-15 LAB — URINE CULTURE

## 2015-02-26 ENCOUNTER — Other Ambulatory Visit: Payer: Self-pay | Admitting: Family Medicine

## 2015-03-21 ENCOUNTER — Telehealth: Payer: Self-pay | Admitting: *Deleted

## 2015-03-21 MED ORDER — LORAZEPAM 0.5 MG PO TABS: ORAL_TABLET | ORAL | Status: DC

## 2015-03-21 NOTE — Addendum Note (Signed)
Addended by: Marcina Millard on: 03/21/2015 05:20 PM   Modules accepted: Orders

## 2015-03-21 NOTE — Telephone Encounter (Signed)
Rx called in to pharmacy. 

## 2015-03-21 NOTE — Telephone Encounter (Signed)
Patient is requesting refill on Lorazepam to be snet to [address] phone # [phone] if appropriate to refill.

## 2015-03-21 NOTE — Telephone Encounter (Signed)
Last visit 11/07/14 Last refill 12/20/14 #30 0 refill

## 2015-03-21 NOTE — Telephone Encounter (Signed)
Refill OK.  Avoid regular use. 

## 2015-04-26 LAB — HM COLONOSCOPY

## 2015-06-09 ENCOUNTER — Telehealth: Payer: Self-pay

## 2015-06-09 NOTE — Telephone Encounter (Signed)
This note was entered by mistake. °

## 2015-06-11 ENCOUNTER — Other Ambulatory Visit: Payer: Self-pay | Admitting: Family Medicine

## 2015-06-11 ENCOUNTER — Telehealth: Payer: Self-pay | Admitting: Family Medicine

## 2015-06-11 NOTE — Telephone Encounter (Signed)
Verbally called in for patient.  

## 2015-06-11 NOTE — Telephone Encounter (Signed)
Refill once 

## 2015-06-11 NOTE — Telephone Encounter (Signed)
Pt needs refill on lorazepam send to [facility] [address]

## 2015-06-11 NOTE — Telephone Encounter (Signed)
Pt has not had a chronic appt to review medications in over a year. Last refill was 8/26 #30. Please advise.

## 2015-07-22 ENCOUNTER — Telehealth: Payer: Self-pay | Admitting: Family Medicine

## 2015-07-22 NOTE — Telephone Encounter (Signed)
See below

## 2015-07-22 NOTE — Telephone Encounter (Signed)
Patient Name: Kelly Woodward DOB: 02/05/1961 Initial Comment Caller States she took some cold meds that had Aspirin in it, is having liquid diarrhea, any liquids she drinks feels like it is burning, abd pain, bile coming out of her nose, left her medication at home Nurse Assessment Nurse: Ronnald Ramp, RN, Miranda Date/Time (Eastern Time): 07/22/2015 9:20:24 AM Confirm and document reason for call. If symptomatic, describe symptoms. ---Caller states she normally take Protonix for GERD and forgot to take it with her when she went out of town. She has been having discomfort from acid reflux for a couple of days. 2 days ago, also started having water diarrhea. Last night after eating pizza, started having burning and liquid coming up her esophagus and the diarrhea was worse. Has the patient traveled out of the country within the last 30 days? ---No Does the patient have any new or worsening symptoms? ---Yes Will a triage be completed? ---Yes Related visit to physician within the last 2 weeks? ---No Does the PT have any chronic conditions? (i.e. diabetes, asthma, etc.) ---Yes List chronic conditions. ---GERD, Hx of stomach ulcers Did the patient indicate they were pregnant? ---No Is this a behavioral health or substance abuse call? ---No Guidelines Guideline Title Affirmed Question Affirmed Notes Abdominal Pain - Upper [1] MILD-MODERATE pain AND [2] not relieved by antacids Final Disposition User See Physician within 4 Hours (or PCP triage) Ronnald Ramp, RN, Miranda Comments Caller is out of state and [facility] Referrals GO TO FACILITY OTHER - SPECIFY Disagree/Comply: Comply

## 2015-07-22 NOTE — Telephone Encounter (Signed)
error 

## 2015-08-27 ENCOUNTER — Telehealth: Payer: Self-pay

## 2015-08-27 MED ORDER — ONDANSETRON HCL 4 MG PO TABS: 4.0000 mg | ORAL_TABLET | Freq: Four times a day (QID) | ORAL | Status: DC

## 2015-08-27 NOTE — Telephone Encounter (Signed)
Rx was call in  

## 2015-08-27 NOTE — Telephone Encounter (Signed)
Refill once.  Avoid regular use. 

## 2015-08-27 NOTE — Telephone Encounter (Signed)
Pt requesting LORazepam (ATIVAN) 0.5 MG tablet Pt last visit 12/10/14 Pt last refill 06/11/15 #30

## 2015-08-29 ENCOUNTER — Telehealth: Payer: Self-pay | Admitting: Family Medicine

## 2015-08-29 MED ORDER — LORAZEPAM 0.5 MG PO TABS: ORAL_TABLET | ORAL | Status: DC

## 2015-08-29 NOTE — Telephone Encounter (Signed)
Verbally called in for patient.  

## 2015-08-29 NOTE — Telephone Encounter (Signed)
Pt said she checked with pharmacy and they donot have the below rx . Can you resend please     LORazepam (ATIVAN) 0.5 MG tablet   [facility] [address]

## 2015-09-17 ENCOUNTER — Other Ambulatory Visit: Payer: Self-pay | Admitting: Family Medicine

## 2015-10-02 ENCOUNTER — Ambulatory Visit (INDEPENDENT_AMBULATORY_CARE_PROVIDER_SITE_OTHER): Payer: BLUE CROSS/BLUE SHIELD | Admitting: Family Medicine

## 2015-10-02 VITALS — BP 130/90 | HR 105 | Temp 99.0°F | Ht 69.0 in | Wt 169.0 lb

## 2015-10-02 DIAGNOSIS — R062 Wheezing: Secondary | ICD-10-CM | POA: Diagnosis not present

## 2015-10-02 DIAGNOSIS — J209 Acute bronchitis, unspecified: Secondary | ICD-10-CM | POA: Diagnosis not present

## 2015-10-02 MED ORDER — BENZONATATE 200 MG PO CAPS: 200.0000 mg | ORAL_CAPSULE | Freq: Three times a day (TID) | ORAL | Status: DC | PRN

## 2015-10-02 MED ORDER — ALBUTEROL SULFATE 108 (90 BASE) MCG/ACT IN AEPB: 2.0000 | INHALATION_SPRAY | RESPIRATORY_TRACT | Status: DC | PRN

## 2015-10-02 MED ORDER — METHYLPREDNISOLONE ACETATE 80 MG/ML IJ SUSP
80.0000 mg | Freq: Once | INTRAMUSCULAR | Status: AC
  Administered 2015-10-02: 80 mg via INTRAMUSCULAR

## 2015-10-02 NOTE — Patient Instructions (Signed)

## 2015-10-02 NOTE — Progress Notes (Signed)
Pre visit review using our clinic review tool, if applicable. No additional management support is needed unless otherwise documented below in the visit note. 

## 2015-10-02 NOTE — Progress Notes (Signed)
   Subjective:    Patient ID: Kelly Woodward, female    DOB: 1960-11-11, 55 y.o.   MRN: [id number]  HPI Acute visit for respiratory infection Last week developed some cough and nasal congestion. By Monday of this week had temperature 100.5. No documented fever past 24 hours. Increased cough with wheezing. No dyspnea at rest or mild activity Mild sore throat. Denies any nausea, vomiting, or diarrhea.  History of wheezing in the past. No history of asthma. Previous insomnia with oral steroids but has tolerated intramuscular steroids without much difficulty.  Past Medical History  Diagnosis Date  . Allergy     Rhinitis  . History of nephrolithiasis 1996  . Osteopenia    Past Surgical History  Procedure Laterality Date  . 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 She has never used smokeless tobacco. She reports that she drinks alcohol. She reports that she does not use illicit drugs. family history includes Cancer in her daughter. Allergies  Allergen Reactions  . Astelin     nosebleeds      Review of Systems  Constitutional: Negative for fever and chills.  HENT: Positive for sore throat.   Respiratory: Positive for cough and wheezing. Negative for shortness of breath.   Cardiovascular: Negative for chest pain.       Objective:   Physical Exam  Constitutional: She appears well-developed and well-nourished.  HENT:  Right Ear: External ear normal.  Left Ear: External ear normal.  Mouth/Throat: Oropharynx is clear and moist.  Neck: Neck supple.  Cardiovascular: Normal rate and regular rhythm.   Pulmonary/Chest: Effort normal. No respiratory distress. She has wheezes. She has no rales.  Lymphadenopathy:    She has no cervical adenopathy.          Assessment & Plan:  Cough with wheezing. Suspect post viral illness. Depo-Medrol 80 mg IM given. Pro-air 2 puffs every 4 hours as needed for cough and wheeze. Follow-up promptly for any recurrent  fever or increased shortness of breath. Refill Tessalon Perles 200 mg every 8 hours as needed for cough

## 2015-10-28 ENCOUNTER — Telehealth: Payer: Self-pay | Admitting: Family Medicine

## 2015-10-28 NOTE — Telephone Encounter (Signed)
Pt was seen last on 10/02/2015. Please advise on medication, and both vaccines.

## 2015-10-28 NOTE — Telephone Encounter (Signed)
Pt need a tetanus shot ,Hep A Vaccine and Typhoid pills ASAP for her trip to [place]/ call w cost at [phone].

## 2015-10-29 MED ORDER — TYPHOID VACCINE PO CPDR: 1.0000 | DELAYED_RELEASE_CAPSULE | ORAL | Status: DC

## 2015-10-29 NOTE — Telephone Encounter (Signed)
Left message for pt to call for the appt and let her know that the RX was call in.

## 2015-10-29 NOTE — Telephone Encounter (Signed)
OK to give Tdap and Hep A vaccine.  For Typhoid, may call in Vivotif take one tablet every other day for 4 doses #4

## 2015-10-29 NOTE — Telephone Encounter (Signed)
Medication has been sent in for patient. Please set up an appt for Tdap and Hep A vaccine. Thanks. Please note in the appt line "tdap and Hep A". Thank you.

## 2015-10-30 ENCOUNTER — Ambulatory Visit: Payer: BLUE CROSS/BLUE SHIELD | Admitting: Family Medicine

## 2015-10-30 ENCOUNTER — Ambulatory Visit (INDEPENDENT_AMBULATORY_CARE_PROVIDER_SITE_OTHER): Payer: BLUE CROSS/BLUE SHIELD | Admitting: Family Medicine

## 2015-10-30 VITALS — BP 120/82 | HR 103 | Temp 98.8°F | Ht 69.0 in | Wt 166.1 lb

## 2015-10-30 DIAGNOSIS — Z23 Encounter for immunization: Secondary | ICD-10-CM | POA: Diagnosis not present

## 2015-10-30 DIAGNOSIS — R05 Cough: Secondary | ICD-10-CM

## 2015-10-30 DIAGNOSIS — R059 Cough, unspecified: Secondary | ICD-10-CM

## 2015-10-30 DIAGNOSIS — R062 Wheezing: Secondary | ICD-10-CM | POA: Diagnosis not present

## 2015-10-30 DIAGNOSIS — Z7184 Encounter for health counseling related to travel: Secondary | ICD-10-CM

## 2015-10-30 DIAGNOSIS — Z7189 Other specified counseling: Secondary | ICD-10-CM | POA: Diagnosis not present

## 2015-10-30 MED ORDER — BECLOMETHASONE DIPROPIONATE 80 MCG/ACT IN AERS: 1.0000 | INHALATION_SPRAY | Freq: Two times a day (BID) | RESPIRATORY_TRACT | Status: DC

## 2015-10-30 NOTE — Progress Notes (Signed)
   Subjective:    Patient ID: RYATT STYLE, female    DOB: Mar 10, 1961, 55 y.o.   MRN: [id number]  HPI  patient seen for the following issues   Recurrent cough. Patient had cough back in March and eventually that resolved. She then last week and had  Recurrence of cough along with some associated wheezing especially bothersome over the weekend. Frequent postnasal drip symptoms that she thinks some of this may be allergic related. No fevers or chills. She has albuterol inhaler which she uses as needed. Nonsmoker. No dyspnea at rest. She has at least a few episodes for year where she has increased cough with wheezing. No formal diagnosis of asthma. She does recall having some wheezing with viral illness over month ago.   Upcoming travel to [place].   She needs tetanus booster long with hepatitis A vaccination and typhoid vaccination. No contraindications to typhoid vaccine that we are aware of.  Past Medical History  Diagnosis Date  . Allergy     Rhinitis  . History of nephrolithiasis 1996  . Osteopenia    Past Surgical History  Procedure Laterality Date  . 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 She has never used smokeless tobacco. She reports that she drinks alcohol. She reports that she does not use illicit drugs. family history includes Cancer in her daughter. Allergies  Allergen Reactions  . Astelin     nosebleeds      Review of Systems  Constitutional: Negative for fever and chills.  HENT: Positive for congestion and postnasal drip. Negative for sore throat and trouble swallowing.   Respiratory: Positive for cough and wheezing.   Cardiovascular: Negative for chest pain.  Neurological: Negative for dizziness.  Hematological: Negative for adenopathy.       Objective:   Physical Exam  Constitutional: She appears well-developed and well-nourished. No distress.  HENT:  Mouth/Throat: Oropharynx is clear and moist.  Neck: Neck supple.    Cardiovascular: Normal rate and regular rhythm.   Pulmonary/Chest: Effort normal. No respiratory distress. She has wheezes. She has no rales.  Lymphadenopathy:    She has no cervical adenopathy.          Assessment & Plan:   #1 cough with associated wheezing. She's had some persistent cough now for few weeks and has definite wheezing on exam which recently improved with oral prednisone. She has shown a tendency toward reactive airway changes with probably allergy and viral triggers. Start Qvar 80 g 1 puff twice daily with recommendation to rinse mouth after use. If cough not resolving in 2 weeks increase to 2 puffs twice daily. Continue albuterol as needed   #2 travel advice encounter. Hepatitis A and tetanus  Vaccines given. Prescription for oral typhoid vaccination given

## 2015-10-30 NOTE — Progress Notes (Signed)
Pre visit review using our clinic review tool, if applicable. No additional management support is needed unless otherwise documented below in the visit note. 

## 2015-10-30 NOTE — Patient Instructions (Signed)
After one week, if cough and wheezing not better increase Qvar to two puffs twice daily Rinse mouth after use.

## 2015-11-03 ENCOUNTER — Telehealth: Payer: Self-pay | Admitting: Family Medicine

## 2015-11-03 NOTE — Telephone Encounter (Signed)
Pt is going to [place] to see her daughter and she received her typhoid.  Pt would like to know how important it is for her daughter to get the typhoid vaccine, Due to traveling to [place] . [place]. .  Would appreciate your input.

## 2015-11-03 NOTE — Telephone Encounter (Signed)
[place] is an at risk area for Typhoid, according to [facility] site.  If possible, would be good to get the vaccine (either shot or oral typhoid vaccine).  Also, very important to avoid all water at risk for contamination - such as anything not bottled, no brushing teeth with tap water, etc.

## 2015-11-03 NOTE — Telephone Encounter (Signed)
Please advise 

## 2015-11-04 NOTE — Telephone Encounter (Signed)
Pt's Mother is aware of annotations from Dr. Elease Hashimoto.

## 2015-12-10 ENCOUNTER — Telehealth: Payer: Self-pay | Admitting: Family Medicine

## 2015-12-10 NOTE — Telephone Encounter (Signed)
Pt request refill of the following:  LORazepam (ATIVAN) 0.5 MG tablet   Phamacy:  [facility]

## 2015-12-11 MED ORDER — LORAZEPAM 0.5 MG PO TABS: ORAL_TABLET | ORAL | Status: DC

## 2015-12-11 NOTE — Telephone Encounter (Signed)
Last refill of #30 was 09/01/15 Appt are UTD Verbally called in for pt

## 2016-01-05 ENCOUNTER — Observation Stay (HOSPITAL_BASED_OUTPATIENT_CLINIC_OR_DEPARTMENT_OTHER)
Admission: EM | Admit: 2016-01-05 | Discharge: 2016-01-06 | Disposition: A | Discharge status: Home / Self Care | Payer: BLUE CROSS/BLUE SHIELD | Attending: Internal Medicine | Admitting: Internal Medicine

## 2016-01-05 ENCOUNTER — Emergency Department (HOSPITAL_BASED_OUTPATIENT_CLINIC_OR_DEPARTMENT_OTHER): Payer: BLUE CROSS/BLUE SHIELD

## 2016-01-05 ENCOUNTER — Observation Stay (HOSPITAL_COMMUNITY): Payer: BLUE CROSS/BLUE SHIELD

## 2016-01-05 ENCOUNTER — Encounter (HOSPITAL_BASED_OUTPATIENT_CLINIC_OR_DEPARTMENT_OTHER): Payer: Self-pay

## 2016-01-05 ENCOUNTER — Emergency Department (HOSPITAL_COMMUNITY): Payer: BLUE CROSS/BLUE SHIELD

## 2016-01-05 DIAGNOSIS — G454 Transient global amnesia: Secondary | ICD-10-CM

## 2016-01-05 DIAGNOSIS — F419 Anxiety disorder, unspecified: Secondary | ICD-10-CM | POA: Diagnosis not present

## 2016-01-05 DIAGNOSIS — G458 Other transient cerebral ischemic attacks and related syndromes: Secondary | ICD-10-CM

## 2016-01-05 DIAGNOSIS — R200 Anesthesia of skin: Secondary | ICD-10-CM

## 2016-01-05 DIAGNOSIS — I10 Essential (primary) hypertension: Secondary | ICD-10-CM | POA: Insufficient documentation

## 2016-01-05 DIAGNOSIS — J302 Other seasonal allergic rhinitis: Secondary | ICD-10-CM | POA: Diagnosis present

## 2016-01-05 DIAGNOSIS — Z79899 Other long term (current) drug therapy: Secondary | ICD-10-CM | POA: Diagnosis not present

## 2016-01-05 DIAGNOSIS — Z8711 Personal history of peptic ulcer disease: Secondary | ICD-10-CM | POA: Insufficient documentation

## 2016-01-05 DIAGNOSIS — M8580 Other specified disorders of bone density and structure, unspecified site: Secondary | ICD-10-CM | POA: Insufficient documentation

## 2016-01-05 DIAGNOSIS — J309 Allergic rhinitis, unspecified: Secondary | ICD-10-CM | POA: Insufficient documentation

## 2016-01-05 DIAGNOSIS — Z79890 Hormone replacement therapy (postmenopausal): Secondary | ICD-10-CM | POA: Diagnosis not present

## 2016-01-05 DIAGNOSIS — E876 Hypokalemia: Secondary | ICD-10-CM | POA: Diagnosis present

## 2016-01-05 DIAGNOSIS — K219 Gastro-esophageal reflux disease without esophagitis: Secondary | ICD-10-CM | POA: Insufficient documentation

## 2016-01-05 DIAGNOSIS — E785 Hyperlipidemia, unspecified: Secondary | ICD-10-CM | POA: Insufficient documentation

## 2016-01-05 DIAGNOSIS — R29898 Other symptoms and signs involving the musculoskeletal system: Secondary | ICD-10-CM

## 2016-01-05 DIAGNOSIS — F17210 Nicotine dependence, cigarettes, uncomplicated: Secondary | ICD-10-CM | POA: Insufficient documentation

## 2016-01-05 DIAGNOSIS — R531 Weakness: Secondary | ICD-10-CM | POA: Diagnosis not present

## 2016-01-05 DIAGNOSIS — G459 Transient cerebral ischemic attack, unspecified: Principal | ICD-10-CM | POA: Insufficient documentation

## 2016-01-05 DIAGNOSIS — Z720 Tobacco use: Secondary | ICD-10-CM | POA: Diagnosis present

## 2016-01-05 DIAGNOSIS — J3089 Other allergic rhinitis: Secondary | ICD-10-CM

## 2016-01-05 HISTORY — DX: Gastro-esophageal reflux disease without esophagitis: K21.9

## 2016-01-05 HISTORY — DX: Peptic ulcer, site unspecified, unspecified as acute or chronic, without hemorrhage or perforation: K27.9

## 2016-01-05 HISTORY — DX: Tobacco use: Z72.0

## 2016-01-05 LAB — CBC
HCT: 37.7 % (ref 36.0–46.0)
Hemoglobin: 12.7 g/dL (ref 12.0–15.0)
MCH: 31 pg (ref 26.0–34.0)
MCHC: 33.7 g/dL (ref 30.0–36.0)
MCV: 92 fL (ref 78.0–100.0)
PLATELETS: 353 10*3/uL (ref 150–400)
RBC: 4.1 MIL/uL (ref 3.87–5.11)
RDW: 13.5 % (ref 11.5–15.5)
WBC: 8.7 10*3/uL (ref 4.0–10.5)

## 2016-01-05 LAB — DIFFERENTIAL
BASOS ABS: 0.1 10*3/uL (ref 0.0–0.1)
Band Neutrophils: 0 %
Basophils Relative: 1 %
Blasts: 0 %
EOS ABS: 0.1 10*3/uL (ref 0.0–0.7)
EOS PCT: 1 %
LYMPHS ABS: 2 10*3/uL (ref 0.7–4.0)
Lymphocytes Relative: 23 %
METAMYELOCYTES PCT: 0 %
MONOS PCT: 9 %
MYELOCYTES: 0 %
Monocytes Absolute: 0.8 10*3/uL (ref 0.1–1.0)
NEUTROS PCT: 66 %
Neutro Abs: 5.7 10*3/uL (ref 1.7–7.7)
Promyelocytes Absolute: 0 %
nRBC: 0 /100 WBC

## 2016-01-05 LAB — URINE MICROSCOPIC-ADD ON

## 2016-01-05 LAB — COMPREHENSIVE METABOLIC PANEL
ALBUMIN: 4.4 g/dL (ref 3.5–5.0)
ALT: 13 U/L — ABNORMAL LOW (ref 14–54)
ANION GAP: 7 (ref 5–15)
AST: 19 U/L (ref 15–41)
Alkaline Phosphatase: 65 U/L (ref 38–126)
BILIRUBIN TOTAL: 0.8 mg/dL (ref 0.3–1.2)
BUN: 21 mg/dL — AB (ref 6–20)
CO2: 25 mmol/L (ref 22–32)
Calcium: 9.7 mg/dL (ref 8.9–10.3)
Chloride: 105 mmol/L (ref 101–111)
Creatinine, Ser: 0.67 mg/dL (ref 0.44–1.00)
GFR calc Af Amer: >60 mL/min (ref >60)
GFR calc non Af Amer: >60 mL/min (ref >60)
GLUCOSE: 114 mg/dL — AB (ref 65–99)
POTASSIUM: 3.4 mmol/L — AB (ref 3.5–5.1)
SODIUM: 137 mmol/L (ref 135–145)
TOTAL PROTEIN: 7.4 g/dL (ref 6.5–8.1)

## 2016-01-05 LAB — URINALYSIS, ROUTINE W REFLEX MICROSCOPIC
BILIRUBIN URINE: NEGATIVE
Glucose, UA: NEGATIVE mg/dL
Ketones, ur: NEGATIVE mg/dL
NITRITE: NEGATIVE
Protein, ur: NEGATIVE mg/dL
Specific Gravity, Urine: 1.005 (ref 1.005–1.030)
pH: 7 (ref 5.0–8.0)

## 2016-01-05 LAB — PROTIME-INR
INR: 1.08 (ref 0.00–1.49)
PROTHROMBIN TIME: 14.2 s (ref 11.6–15.2)

## 2016-01-05 LAB — APTT: aPTT: 32 seconds (ref 24–37)

## 2016-01-05 LAB — RAPID URINE DRUG SCREEN, HOSP PERFORMED
AMPHETAMINES: NOT DETECTED
BARBITURATES: NOT DETECTED
BENZODIAZEPINES: NOT DETECTED
Cocaine: NOT DETECTED
Opiates: NOT DETECTED
Tetrahydrocannabinol: NOT DETECTED

## 2016-01-05 LAB — ETHANOL

## 2016-01-05 LAB — HCG, QUANTITATIVE, PREGNANCY: hCG, Beta Chain, Quant, S: 2 m[IU]/mL (ref <5)

## 2016-01-05 LAB — CBG MONITORING, ED: GLUCOSE-CAPILLARY: 104 mg/dL — AB (ref 65–99)

## 2016-01-05 LAB — TROPONIN I

## 2016-01-05 MED ORDER — SODIUM CHLORIDE 0.9 % IV SOLN
INTRAVENOUS | Status: DC
  Administered 2016-01-05: 23:00:00 via INTRAVENOUS

## 2016-01-05 MED ORDER — LORAZEPAM 0.5 MG PO TABS: 0.5000 mg | ORAL_TABLET | Freq: Three times a day (TID) | ORAL | Status: DC | PRN

## 2016-01-05 MED ORDER — CLOPIDOGREL BISULFATE 75 MG PO TABS
75.0000 mg | ORAL_TABLET | Freq: Every day | ORAL | Status: DC
  Administered 2016-01-05 – 2016-01-06 (×2): 75 mg via ORAL
  Filled 2016-01-05 (×2): qty 1

## 2016-01-05 MED ORDER — ENOXAPARIN SODIUM 40 MG/0.4ML ~~LOC~~ SOLN
40.0000 mg | SUBCUTANEOUS | Status: DC
  Administered 2016-01-05: 40 mg via SUBCUTANEOUS
  Filled 2016-01-05: qty 0.4

## 2016-01-05 MED ORDER — POTASSIUM CHLORIDE 20 MEQ/15ML (10%) PO SOLN
20.0000 meq | Freq: Once | ORAL | Status: AC
  Administered 2016-01-05: 20 meq via ORAL
  Filled 2016-01-05: qty 15

## 2016-01-05 MED ORDER — CALCIUM CARBONATE ANTACID 500 MG PO CHEW: 1.0000 | CHEWABLE_TABLET | Freq: Every day | ORAL | Status: DC | PRN

## 2016-01-05 MED ORDER — LORATADINE 10 MG PO TABS
10.0000 mg | ORAL_TABLET | Freq: Every day | ORAL | Status: DC
  Filled 2016-01-05: qty 1

## 2016-01-05 MED ORDER — PANTOPRAZOLE SODIUM 40 MG PO TBEC
40.0000 mg | DELAYED_RELEASE_TABLET | Freq: Every day | ORAL | Status: DC
  Administered 2016-01-06: 40 mg via ORAL
  Filled 2016-01-05: qty 1

## 2016-01-05 MED ORDER — STROKE: EARLY STAGES OF RECOVERY BOOK
Freq: Once | Status: AC
  Administered 2016-01-05: 21:00:00
  Filled 2016-01-05: qty 1

## 2016-01-05 MED ORDER — SENNOSIDES-DOCUSATE SODIUM 8.6-50 MG PO TABS: 1.0000 | ORAL_TABLET | Freq: Every evening | ORAL | Status: DC | PRN

## 2016-01-05 NOTE — H&P (Addendum)
History and Physical    Kelly Woodward [id number] DOB: 1960-11-10 DOA: 01/05/2016  Referring MD/NP/PA:   PCP: Eulas Post, MD   Patient coming from:  The patient is coming from home.  At baseline, pt is independent for most of ADL. SNF  Assistant living facility   [facility]irement center.      Chief Complaint: Left-sided weakness and numbness  HPI: Kelly Woodward is a 55 y.o. female with medical history significant of allergy, tobacco abuse, GERD, peptic ulcer (need to avoid ASA per pt), anxiety, who presents with left-sided weakness and numbness.  Patient states that she started having sudden onset of left foot and lower leg numbness and weakness at about 4:45 PM. It has progressed to have involved left arm. She also had left facial numbness. Pt reports that initally her left foot rolled several times and had difficulty lifting her left leg and walking.her symptoms lasted for about 2.5 hours and resolved completely. Patient does not have vision change, hearing loss, difficulty speaking. Patient denies chest pain, shortness of breath, nausea, vomiting, abdominal pain or diarrhea. Patient is strongly denies any symptoms of UTI.  ED Course: pt was found to have INR 1.08, PTT 32, negative troponin, negative UDS, positive urinalysis with moderate amount of leukocytes, WBCs 8.7, temperature normal, potassium 3.4, renal function normal, negative CT head for acute intracranial abnormalities. MRI of brain is negative for acute intracranial abnormalities. Patient is placed on telemetry bed for observation. Neuro was consulted.   Review of Systems:   General: no fevers, chills, no changes in body weight, has fatigue HEENT: no blurry vision, hearing changes or sore throat Pulm: no dyspnea, coughing, wheezing CV: no chest pain, no palpitations Abd: no nausea, vomiting, abdominal pain, diarrhea, constipation GU: no dysuria, burning on urination, increased urinary frequency, hematuria  Ext: no leg  edema Neuro: has left sided unilateral weakness, numbness, or tingling, no vision change or hearing loss Skin: no rash MSK: No muscle spasm, no deformity, no limitation of range of movement in spin Heme: No easy bruising.  Travel history: No recent long distant travel.  Allergy:  Allergies  Allergen Reactions  . Astelin Other (See Comments)    nosebleeds    Past Medical History  Diagnosis Date  . Allergy     Rhinitis  . History of nephrolithiasis 1996  . Osteopenia   . GERD (gastroesophageal reflux disease)   . Peptic ulcer     need to avoid ASA per pt.  . Tobacco abuse     Past Surgical History  Procedure Laterality Date  . Cyst on vocal chord  1998    Social History:  reports that she has been smoking.  She has never used smokeless tobacco. She reports that she does not drink alcohol or use illicit drugs.  Family History:  Family History  Problem Relation Age of Onset  . Cancer Daughter     melanoma- 2011  . Hypertension Mother      Prior to Admission medications   Medication Sig Start Date End Date Taking? Authorizing Provider  b complex vitamins tablet Take 1 tablet by mouth daily.    Historical Provider, MD  cetirizine (ZYRTEC) 10 MG tablet Take 10 mg by mouth daily.      Historical Provider, MD  estradiol-norethindrone [facility]) 0.05-0.14 MG/DAY Place 1 patch onto the skin 2 (two) times a week.    Historical Provider, MD  LORazepam (ATIVAN) 0.5 MG tablet take 1 tablet by mouth every 8 hours if needed.  12/11/15   Eulas Post, MD  pantoprazole (PROTONIX) 40 MG tablet TAKE 1 TABLET (40 MG TOTAL) BY MOUTH DAILY. 09/17/15   Eulas Post, MD    Physical Exam: Filed Vitals:   01/05/16 2000 01/05/16 2030 01/05/16 2045 01/05/16 2100  BP: 130/96 146/88 152/83 163/78  Pulse: 88 105 101 98  Temp:      TempSrc:      Resp: 15 15  15   Height:      Weight:      SpO2: 100% 100% 100% 100%   General: Not in acute distress HEENT:       Eyes: PERRL, EOMI,  no scleral icterus.       ENT: No discharge from the ears and nose, no pharynx injection, no tonsillar enlargement.        Neck: No JVD, no bruit, no mass felt. Heme: No neck lymph node enlargement. Cardiac: S1/S2, RRR, No murmurs, No gallops or rubs. Pulm:  No rales, wheezing, rhonchi or rubs. Abd: Soft, nondistended, nontender, no rebound pain, no organomegaly, BS present. GU: No hematuria Ext: No pitting leg edema bilaterally. 2+DP/PT pulse bilaterally. Musculoskeletal: No joint deformities, No joint redness or warmth, no limitation of ROM in spin. Skin: No rashes.  Neuro: Alert, oriented X3, cranial nerves II-XII grossly intact, moves all extremities normally. Muscle strength 5/5 in all extremities, sensation to light touch intact. Brachial reflex 2+ bilaterally. Knee reflex 1+ bilaterally. Negative Babinski's sign. Normal finger to nose test. Psych: Patient is not psychotic, no suicidal or hemocidal ideation.  Labs on Admission: I have personally reviewed following labs and imaging studies  CBC:  Recent Labs Lab 01/05/16 1550  WBC 8.7  NEUTROABS 5.7  HGB 12.7  HCT 37.7  MCV 92.0  PLT 0000000   Basic Metabolic Panel:  Recent Labs Lab 01/05/16 1550  NA 137  K 3.4*  CL 105  CO2 25  GLUCOSE 114*  BUN 21*  CREATININE 0.67  CALCIUM 9.7   GFR: Estimated Creatinine Clearance: 84 mL/min (by C-G formula based on Cr of 0.67). Liver Function Tests:  Recent Labs Lab 01/05/16 1550  AST 19  ALT 13*  ALKPHOS 65  BILITOT 0.8  PROT 7.4  ALBUMIN 4.4   No results for input(s): LIPASE, AMYLASE in the last 168 hours. No results for input(s): AMMONIA in the last 168 hours. Coagulation Profile:  Recent Labs Lab 01/05/16 1550  INR 1.08   Cardiac Enzymes:  Recent Labs Lab 01/05/16 1550  TROPONINI <0.03   BNP (last 3 results) No results for input(s): PROBNP in the last 8760 hours. HbA1C: No results for input(s): HGBA1C in the last 72 hours. CBG:  Recent Labs Lab  01/05/16 1552  GLUCAP 104*   Lipid Profile: No results for input(s): CHOL, HDL, LDLCALC, TRIG, CHOLHDL, LDLDIRECT in the last 72 hours. Thyroid Function Tests: No results for input(s): TSH, T4TOTAL, FREET4, T3FREE, THYROIDAB in the last 72 hours. Anemia Panel: No results for input(s): VITAMINB12, FOLATE, FERRITIN, TIBC, IRON, RETICCTPCT in the last 72 hours. Urine analysis:    Component Value Date/Time   COLORURINE YELLOW 01/05/2016 1700   APPEARANCEUR CLEAR 01/05/2016 1700   LABSPEC 1.005 01/05/2016 1700   PHURINE 7.0 01/05/2016 1700   GLUCOSEU NEGATIVE 01/05/2016 1700   HGBUR TRACE* 01/05/2016 1700   BILIRUBINUR NEGATIVE 01/05/2016 1700   BILIRUBINUR 1+ 04/18/2014 1619   KETONESUR NEGATIVE 01/05/2016 1700   PROTEINUR NEGATIVE 01/05/2016 1700   PROTEINUR neg 04/18/2014 1619   UROBILINOGEN 1.0 02/13/2015 1530  UROBILINOGEN 4.0 04/18/2014 1619   NITRITE NEGATIVE 01/05/2016 1700   NITRITE neg 04/18/2014 1619   LEUKOCYTESUR MODERATE* 01/05/2016 1700   Sepsis Labs: @LABRCNTIP (procalcitonin:4,lacticidven:4) )No results found for this or any previous visit (from the past 240 hour(s)).   Radiological Exams on Admission: Ct Head Wo Contrast  01/05/2016  CLINICAL DATA:  55 year old female with left lower extremity weakness. EXAM: CT HEAD WITHOUT CONTRAST TECHNIQUE: Contiguous axial images were obtained from the base of the skull through the vertex without intravenous contrast. COMPARISON:  Head CT dated 04/25/2004 FINDINGS: The ventricles and the sulci are appropriate in size for the patient's age. There is no intracranial hemorrhage. No midline shift or mass effect identified. The gray-white matter differentiation is preserved. The visualized paranasal sinuses and mastoid air cells are well aerated. The calvarium is intact. IMPRESSION: No acute intracranial pathology. Electronically Signed   By: Anner Crete M.D.   On: 01/05/2016 16:07   Mr Brain Wo Contrast  01/05/2016  CLINICAL  DATA:  Left-sided weakness.  Visual changes. EXAM: MRI HEAD WITHOUT CONTRAST TECHNIQUE: Multiplanar, multiecho pulse sequences of the brain and surrounding structures were obtained without intravenous contrast. COMPARISON:  CT head 01/05/2016 FINDINGS: Ventricle size normal.  Cerebral volume normal. Negative for acute or chronic infarction Negative for demyelinating disease. Cerebral white matter normal. Brainstem normal. Negative for intracranial hemorrhage or fluid collection. Negative for mass or edema.  No shift of the midline structures Paranasal sinuses clear. Pituitary and skull base normal. Normal orbits. IMPRESSION: Normal MRI of the brain. Electronically Signed   By: Franchot Gallo M.D.   On: 01/05/2016 19:28     EKG: Independently reviewed. Sinus rhythm, QTC 431, no ischemic change.  Assessment/Plan Principal Problem:   TIA (transient ischemic attack) Active Problems:   Seasonal and perennial allergic rhinitis   Anxiety   GERD (gastroesophageal reflux disease)   Hypokalemia   Tobacco abuse   TIA (transient ischemic attack): Patient's symptoms are consistent with TIA. Her risk factors include tobacco abuse and home replacement therapy. CT-head and MRI-brain are negative. Neuro was consulted.  - will admit to tele bed - Neurology was consulted by EDP, will follow up recommendations. -Atrial fibrillation: not present  -tPA was not given because symptoms has resolved and MRI-negative for stroke - Risk factor modification: HgbA1c, fasting lipid panel - MRA of the brain without contrast  - PT consult, OT consult - Bedside swallowing screen was ordered, will get speech consult in AM - 2 d Echocardiogram  - Ekg was done  - Carotid dopplers  - start Plavix and lipitor per neuro - Hold Combipatch  Tobacco abuse: pt states that she smokes 3 to 4 cigarette per week for about one year. -Did counseling about importance of quitting smoking -pt refused Nicotine patch  Seasonal and  perennial allergic rhinitis: - Claritin daily  Anxiety: -continue home Ativan prn  GERD: -Protonix  Hypokalemia: K= 3.4 on admission. - Repleted - Check Mg level  Positive UA: pt has positive urinalysis with moderate amount of mucositis, but denies any symptoms of UTI. -Hold off antibiotics -Follow-up urine culture   DVT ppx: SQ Lovenox Code Status: Full code Family Communication: None at bed side.  Disposition Plan:  Anticipate discharge back to previous home environment Consults called:  Neurology, dr.  Admission status: Obs / tele   Date of Service 01/05/2016    Ivor Costa [facility] Pager [phone]  If 7PM-7AM, please contact night-coverage [email or web address] Password [facility] 01/05/2016, 9:24 PM

## 2016-01-05 NOTE — ED Notes (Signed)
Pt arrives from [facility] via [facility], pt reports at approx 1445 this afternoon she noticed tingling in her left foot and leg up to her knee, pt reports she had some difficulty ambulating and noticed upon trying to stand, she was standing on the outer portion of her foot and could not feel it. Pt reports having some left arm weakness and some visual changes in her left eye while at [facility]. Pt reports around 1700 she noticed the weakness and tingling in her left foot had subsided and has no complaints at this time. Pt denies any headaches this afternoon. Pt a/ox4, speech clear, no neuro deficits noted at this time.

## 2016-01-05 NOTE — ED Provider Notes (Signed)
Pt transferred from [facility] for evaluation of left sided weakness.  Weakness completely resolved by the time patient transferred to [facility] ED.  Plan to obtain MRI brain to further evaluate.    MRI negative for acute CVA. The patient has been evaluated by the neurologist in the emergency department. Plan to admit for TIA workup.  Quintella Reichert, MD 01/06/16 0104

## 2016-01-05 NOTE — ED Notes (Signed)
MD at bedside. Code stroke canceled but still will transfer to [facility]

## 2016-01-05 NOTE — ED Notes (Signed)
MD at bedside. 

## 2016-01-05 NOTE — ED Notes (Signed)
Reprot given to [place] Rn at [facility] ED

## 2016-01-05 NOTE — ED Notes (Signed)
amb to br w/o assist

## 2016-01-05 NOTE — ED Provider Notes (Signed)
CSN: [id number]     Arrival date & time 01/05/16  1530 History  By signing my name below, I, Soijett Blue, attest that this documentation has been prepared under the direction and in the presence of Gareth Morgan, MD. Electronically Signed: Soijett Blue, ED Scribe. 01/05/2016. 3:56 PM.   Chief Complaint  Patient presents with  . Numbness      The history is provided by the patient. No language interpreter was used.    HPI Comments: Kelly Woodward is a 55 y.o. female who presents to the Emergency Department complaining of sudden onset of LLE numbness onset 1 hour ago PTA. Pt notes that she stood up from her desk at work when her left foot was tingling and she tried to stomp to "wake it up". Pt reports that her left foot rolled several times and she "stepped onto her left foot" due to the numbness. Felt she was having difficulty lifting her left leg and walking.  Pt states that she drive to the ED and upon arriving, her left arm felt heavy and her left leg still feels "prickly". She states that she is having associated symptoms of weakness to left side, blurred vision to left eye, and nausea. She states that she has not tried any medications for the relief for her symptoms. She denies aphasia, dizziness, numbness to left arm, HA, CP, SOB, abdominal pain, urinary symptoms, back pain, and any other symptoms. Denies medical issues. Pt states that she is an occasional cigarette smoker.   Past Medical History  Diagnosis Date  . Allergy     Rhinitis  . History of nephrolithiasis 1996  . Osteopenia    Past Surgical History  Procedure Laterality Date  . Cyst on vocal chord  1998   Family History  Problem Relation Age of Onset  . Cancer Daughter     melanoma- 2011   Social History  Substance Use Topics  . Smoking status: Current Some Day Smoker  . Smokeless tobacco: Never Used  . Alcohol Use: 0.0 oz/week    0 Standard drinks or equivalent per week     Comment: rare   OB History     Gravida Para Term Preterm AB TAB SAB Ectopic Multiple Living   8         2     Review of Systems  Constitutional: Negative for fever.  HENT: Negative for sore throat.   Eyes: Positive for visual disturbance (blurred vision to left eye).  Respiratory: Negative for cough and shortness of breath.   Cardiovascular: Negative for chest pain.  Gastrointestinal: Positive for nausea. Negative for vomiting and abdominal pain.  Genitourinary: Negative for difficulty urinating.  Musculoskeletal: Negative for back pain and neck pain.  Skin: Negative for rash.  Neurological: Positive for weakness (left sided) and numbness (left sided). Negative for dizziness, syncope, facial asymmetry (feeling of heaviness to left side), speech difficulty and headaches.      Allergies  Astelin  Home Medications   Prior to Admission medications   Medication Sig Start Date End Date Taking? Authorizing Provider  b complex vitamins tablet Take 1 tablet by mouth daily.    Historical Provider, MD  cetirizine (ZYRTEC) 10 MG tablet Take 10 mg by mouth daily.      Historical Provider, MD  estradiol-norethindrone [facility]) 0.05-0.14 MG/DAY Place 1 patch onto the skin 2 (two) times a week.    Historical Provider, MD  LORazepam (ATIVAN) 0.5 MG tablet take 1 tablet by mouth every 8 hours  if needed. 12/11/15   Eulas Post, MD  pantoprazole (PROTONIX) 40 MG tablet TAKE 1 TABLET (40 MG TOTAL) BY MOUTH DAILY. 09/17/15   Eulas Post, MD   BP 138/99 mmHg  Pulse 110  Temp(Src) 98.7 F (37.1 C) (Oral)  Resp 12  Ht 5\' 9"  (1.753 m)  Wt 158 lb (71.668 kg)  BMI 23.32 kg/m2  SpO2 100%  LMP 12/30/2015 Physical Exam  Constitutional: She is oriented to person, place, and time. She appears well-developed and well-nourished. No distress.  HENT:  Head: Normocephalic and atraumatic.  Mouth/Throat: Uvula is midline, oropharynx is clear and moist and mucous membranes are normal.  Eyes: Conjunctivae and EOM are normal.  Pupils are equal, round, and reactive to light.  Neck: Neck supple.  Cardiovascular: Normal rate.   Pulmonary/Chest: Effort normal. No respiratory distress.  Abdominal: She exhibits no distension.  Musculoskeletal: Normal range of motion.  4/5 strength to LLE.  Neurological: She is alert and oriented to person, place, and time. A sensory deficit is present. No cranial nerve deficit. Coordination normal. GCS eye subscore is 4. GCS verbal subscore is 5. GCS motor subscore is 6.  Distal LLE numbness. Cranial nerves intact. Reports heaviness to face and arm but denies numbness on exam.  Slight drift of left leg without touching bed, to resistance strength LLE 4/5, RLE 5/5, equal bilateral UE strength, no facial droop  No visual field deficits, no extinction, normal hand squeeze, eye blinking, alert, normal heel-shin and finger to nose, no aphasia  Skin: Skin is warm and dry.  Psychiatric: She has a normal mood and affect. Her behavior is normal.  Nursing note and vitals reviewed.   ED Course  Procedures (including critical care time) DIAGNOSTIC STUDIES: Oxygen Saturation is 100% on RA, nl by my interpretation.    COORDINATION OF CARE: 3:54 PM Discussed treatment plan with pt at bedside which includes CBG, CT head, EKG, labs, UA, and pt agreed to plan.    Labs Review Labs Reviewed  COMPREHENSIVE METABOLIC PANEL - Abnormal; Notable for the following:    Potassium 3.4 (*)    Glucose, Bld 114 (*)    BUN 21 (*)    ALT 13 (*)    All other components within normal limits  CBG MONITORING, ED - Abnormal; Notable for the following:    Glucose-Capillary 104 (*)    All other components within normal limits  ETHANOL  PROTIME-INR  APTT  CBC  DIFFERENTIAL  TROPONIN I  URINE RAPID DRUG SCREEN, HOSP PERFORMED  URINALYSIS, ROUTINE W REFLEX MICROSCOPIC (NOT AT [facility])    Imaging Review Ct Head Wo Contrast  01/05/2016  CLINICAL DATA:  55 year old female with left lower extremity weakness.  EXAM: CT HEAD WITHOUT CONTRAST TECHNIQUE: Contiguous axial images were obtained from the base of the skull through the vertex without intravenous contrast. COMPARISON:  Head CT dated 04/25/2004 FINDINGS: The ventricles and the sulci are appropriate in size for the patient's age. There is no intracranial hemorrhage. No midline shift or mass effect identified. The gray-white matter differentiation is preserved. The visualized paranasal sinuses and mastoid air cells are well aerated. The calvarium is intact. IMPRESSION: No acute intracranial pathology. Electronically Signed   By: Anner Crete M.D.   On: 01/05/2016 16:07   I have personally reviewed and evaluated these images and lab results as part of my medical decision-making.   EKG Interpretation   Date/Time:  Monday January 05 2016 15:49:56 EDT Ventricular Rate:  110 PR Interval:  138 QRS Duration: 82 QT Interval:  319 QTC Calculation: 431 R Axis:   -2 Text Interpretation:  Sinus tachycardia Borderline repolarization  abnormality No previous ECGs available Confirmed by [facility] MD, Cesar Rogerson  ([id number]) on 01/05/2016 3:59:54 PM      MDM   Final diagnoses:  Weakness of left lower extremity  Numbness of left lower extremity    55 year old female with no significant medical history other than occasional smoking, recent trip to [place] with diarrheal illness, presents with concern for left lower extremity numbness and weakness, sensation of heaviness to left face and left arm, and feeling of blurred vision on the left. Code stroke was initially called given timing and symptoms, however NIH stroke scale was calculated and was 2 (drift without touching bed, sensation altered) Given low NIH stroke scale, will not initiate TPA, however will transfer to [facility] for further evaluation of stroke and likely admission. Glucose within normal limits. Potassium slightly low at 3.4. EKG without acute abnormalities. No chest pain or shortness of breath.  Patient  updated on plan of care.   I personally performed the services described in this documentation, which was scribed in my presence. The recorded information has been reviewed and is accurate.   Gareth Morgan, MD 01/05/16 [phone]

## 2016-01-05 NOTE — ED Notes (Signed)
Patient transported to CT with nurse and monitor 

## 2016-01-05 NOTE — ED Notes (Addendum)
C/o sudden onset on left foot tingling-during drive to ED left arm started feeling weak and tingling and "strange' sensation to left side of face-no facial asymmetry noted or difficulty with speech-states she drove self here -Pt NAD-A/O-states felt unsteady with gait earlier-presents to triage in w/c

## 2016-01-05 NOTE — ED Notes (Signed)
Patient transported to MRI 

## 2016-01-05 NOTE — ED Notes (Signed)
Pt back from MRI 

## 2016-01-05 NOTE — Consult Note (Signed)
Neurology Consultation Reason for Consult: Transient ischemic attack Referring Physician: Gareth Morgan MD     HPI: Kelly Woodward is a 55 y.o. female presents with acute onset of left-sided numbness followed by mild weakness involving the arms face and leg. Onset of symptoms 4:45 PM. Symptoms completely resolved within 2-2-1/2 hours. History of tobacco dependence Denies any history of diabetes hypertension and hyperlipidemia or heart disease. She is on hormone replacement therapy patch for menopausal symptoms MRI brain was performed which is negative for acute stroke, EKG showed sinus rhythm Currently denies any chest pain palpitation headache no change in vision, no chin bowel or urinary habits  Past Medical History  Diagnosis Date  . Allergy     Rhinitis  . History of nephrolithiasis 1996  . Osteopenia   . GERD (gastroesophageal reflux disease)   . Peptic ulcer     need to avoid ASA per pt.  . Tobacco abuse    Allergies  Astelin  Home Medications   Prior to Admission medications   Medication Sig Start Date End Date Taking? Authorizing Provider  b complex vitamins tablet Take 1 tablet by mouth daily.    Historical Provider, MD  cetirizine (ZYRTEC) 10 MG tablet Take 10 mg by mouth daily.     Historical Provider, MD  estradiol-norethindrone [facility]) 0.05-0.14 MG/DAY Place 1 patch onto the skin 2 (two) times a week.    Historical Provider, MD  LORazepam (ATIVAN) 0.5 MG tablet take 1 tablet by mouth every 8 hours if needed. 12/11/15   Eulas Post, MD  pantoprazole (PROTONIX) 40 MG tablet TAKE 1 TABLET (40 MG TOTAL) BY MOUTH DAILY. 2            Family History  Problem Relation Age of Onset  . Cancer Daughter     melanoma- 2011  . Hypertension Mother      Social History:  reports that she has been smoking.  She has never used smokeless tobacco. She reports that she does not drink alcohol or use illicit  drugs.   Exam: Current vital signs: BP 163/78 mmHg  Pulse 98  Temp(Src) 98.7 F (37.1 C) (Oral)  Resp 15  Ht 5\' 9"  (1.753 m)  Wt 71.668 kg (158 lb)  BMI 23.32 kg/m2  SpO2 100%  LMP 12/30/2015 Vital signs in last 24 hours: Temp:  [98.7 F (37.1 C)] 98.7 F (37.1 C) (06/12 1822) Pulse Rate:  [88-110] 98 (06/12 2100) Resp:  [10-20] 15 (06/12 2100) BP: (130-163)/(78-109) 163/78 mmHg (06/12 2100) SpO2:  [100 %] 100 % (06/12 2100) Weight:  [71.668 kg (158 lb)] 71.668 kg (158 lb) (06/12 1535)  Physical Exam  Constitutional: Appears well-developed and well-nourished.  Psych: Affect appropriate to situation Eyes: No scleral injection HENT: No OP obstrucion Head: Normocephalic.  Cardiovascular: Normal rate and regular rhythm.  Respiratory: Effort normal and breath sounds normal to anterior ascultation GI: Soft.  No distension. There is no tenderness.  Skin: WDI  Neuro: Mental Status: Patient is awake, alert, oriented to person, place, month, year, and situation. Patient is able to give a clear and coherent history. No signs of aphasia or neglect Cranial Nerves: II: Visual Fields are full. Pupils are equal, round, and reactive to light.   III,IV, VI: EOMI without ptosis or diploplia.  V: Facial sensation is symmetric to temperature VII: Facial movement is symmetric.  VIII: hearing is intact to voice X: Uvula elevates symmetrically XI: Shoulder shrug is symmetric. XII: tongue is midline without atrophy or fasciculations.  Motor: Tone is normal. Bulk is normal. 5/5 strength was present in all four extremities.  Sensory: Sensation is symmetric to light touch and temperature in the arms and legs. Deep Tendon Reflexes: 2+ and symmetric in the biceps and patellae.  Plantars: Toes are downgoing bilaterally. Cerebellar: FNF and HKS are intact bilaterally   MRI brain reviewed which is negative for acute stroke, EKG showed sinus rhythm  Assessment assessment and  recommendation 55 year old female presented with acute onset of left-sided numbness followed by weakness lasting about 2-1/2 hours consistent with transient ischemic attack. Associated risk factors include hormone replacement therapy and tobacco. Diagnoses and associated risk factor for stroke was discussed in detail with the patient. Recommend cessation of hormone replacement therapy and tobacco smoking. We'll start patient on Plavix and statin therapy since she reports history of peptic ulcer disease with aspirin and NSAIDs. Will obtain carotid Doppler, hemoglobin A1c, lipid panel    Lurena Joiner, MD 9:41 PM

## 2016-01-05 NOTE — ED Notes (Signed)
Patient already in gown, placed on monitor, continuous pulse oximetry and blood pressure cuff; visitor at bedside 

## 2016-01-05 NOTE — Progress Notes (Signed)
Patient arrived to 5M09 AAOx4 and pleasant. She walked to the bed with no issues. Tele placed and vitals taken. Will continue to monitor. Kelce Bouton, Rande Brunt, RN

## 2016-01-06 ENCOUNTER — Observation Stay (HOSPITAL_BASED_OUTPATIENT_CLINIC_OR_DEPARTMENT_OTHER): Payer: BLUE CROSS/BLUE SHIELD

## 2016-01-06 DIAGNOSIS — G459 Transient cerebral ischemic attack, unspecified: Secondary | ICD-10-CM

## 2016-01-06 LAB — LIPID PANEL
CHOL/HDL RATIO: 3.6 ratio
CHOLESTEROL: 152 mg/dL (ref 0–200)
HDL: 42 mg/dL (ref >40)
LDL CALC: 94 mg/dL (ref 0–99)
TRIGLYCERIDES: 78 mg/dL (ref <150)
VLDL: 16 mg/dL (ref 0–40)

## 2016-01-06 LAB — GLUCOSE, CAPILLARY: Glucose-Capillary: 105 mg/dL — ABNORMAL HIGH (ref 65–99)

## 2016-01-06 LAB — MAGNESIUM: Magnesium: 2 mg/dL (ref 1.7–2.4)

## 2016-01-06 LAB — ECHOCARDIOGRAM COMPLETE
Height: 69 in
Weight: 2641.99 oz

## 2016-01-06 MED ORDER — CLOPIDOGREL BISULFATE 75 MG PO TABS: 75.0000 mg | ORAL_TABLET | Freq: Every day | ORAL | Status: DC

## 2016-01-06 MED ORDER — ATORVASTATIN CALCIUM 40 MG PO TABS
40.0000 mg | ORAL_TABLET | Freq: Every day | ORAL | Status: DC
  Filled 2016-01-06: qty 1

## 2016-01-06 MED ORDER — ATORVASTATIN CALCIUM 40 MG PO TABS: 40.0000 mg | ORAL_TABLET | Freq: Every day | ORAL | Status: DC

## 2016-01-06 NOTE — Progress Notes (Signed)
OT Cancellation Note  Patient Details Name: Kelly Woodward MRN: [id number] DOB: February 24, 1961   Cancelled Treatment:    Reason Eval/Treat Not Completed: Patient not medically ready (Bedrest ) Please increase orders as appropriate  Vonita Moss   OTR/L Pager: [phone] Office: [phone] .  01/06/2016, 8:10 AM

## 2016-01-06 NOTE — Progress Notes (Signed)
Echocardiogram 2D Echocardiogram has been performed.  Kelly Woodward 01/06/2016, 10:24 AM

## 2016-01-06 NOTE — Progress Notes (Signed)
VASCULAR LAB PRELIMINARY  PRELIMINARY  PRELIMINARY  PRELIMINARY  Carotid duplex completed.    Right side-  1-39% ICA stenosis.   Left side- no evidence of stenosis in the ICA.   Vertebral artery flow is antegrade.     Janifer Adie, RVT, RDMS 01/06/2016, 3:17 PM

## 2016-01-06 NOTE — Discharge Summary (Signed)
Kelly Woodward, is a 55 y.o. female  DOB Feb 26, 1961  MRN [id number].  Admission date:  01/05/2016  Admitting Physician  Ivor Costa, MD  Discharge Date:  01/06/2016   Primary MD  Eulas Post, MD  Recommendations for primary care physician for things to follow:  - Please check LFTs, lipid panel in 8 weeks. - Follow on urine culture and A1c sent during hospital stay  Admission Diagnosis  TIA (transient ischemic attack) [G45.9] Tobacco abuse [Z72.0] Gastroesophageal reflux disease without esophagitis [K21.9] Weakness of left lower extremity [R29.898] Numbness of left lower extremity [R20.8]   Discharge Diagnosis  TIA (transient ischemic attack) [G45.9] Tobacco abuse [Z72.0] Gastroesophageal reflux disease without esophagitis [K21.9] Weakness of left lower extremity [R29.898] Numbness of left lower extremity [R20.8]    Principal Problem:   TIA (transient ischemic attack) Active Problems:   Seasonal and perennial allergic rhinitis   Anxiety   GERD (gastroesophageal reflux disease)   Hypokalemia   Tobacco abuse      Past Medical History  Diagnosis Date  . Allergy     Rhinitis  . History of nephrolithiasis 1996  . Osteopenia   . GERD (gastroesophageal reflux disease)   . Peptic ulcer     need to avoid ASA per pt.  . Tobacco abuse     Past Surgical History  Procedure Laterality Date  . 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 At baseline, pt is independent for most of ADL. [facility]  Chief Complaint:  Left-sided weakness and numbness  HPI: Kelly Woodward is a 55 y.o. female with medical history significant of allergy, tobacco abuse, GERD, peptic ulcer (need to avoid ASA per pt), anxiety, who presents with left-sided weakness and numbness.  Patient states that she started having sudden onset of left foot and lower leg numbness and weakness at about 4:45 PM. It has progressed to have involved left arm. She also had left facial numbness. Pt reports that initally her left foot rolled several times and had difficulty lifting her left leg and walking.her symptoms lasted for about 2.5 hours and resolved completely. Patient does not have vision change, hearing loss, difficulty speaking. Patient denies chest pain, shortness of breath, nausea, vomiting, abdominal pain or diarrhea. Patient is strongly denies any symptoms of UTI.  ED Course: pt was found to have INR 1.08, PTT 32, negative troponin, negative UDS, positive urinalysis with moderate amount of leukocytes, WBCs 8.7, temperature normal, potassium 3.4, renal function normal, negative CT head for acute intracranial abnormalities. MRI of brain is negative for acute intracranial abnormalities. Patient is placed on telemetry bed for observation.  Neuro was consulted.   Hospital Course   TIA - Patient presents with a transient episode of left-sided tingling numbness and weakness, and to strictly result, normal MRI, unremarkable MRA, carotid Doppler with no thickened stenosis, LDL is 94, started on Lipitor, patient on any antithrombotic spray to admission, started on Plavix and giving to her history of peptic ulcer disease, she desires to avoid aspirin.  Hypertension - Blood pressure acceptable during hospital stay  Hyperlipidemia - Started on statin  Discharge Condition:  Stable   Follow UP  Follow-up Information    Follow up with Eulas Post, MD. Schedule an appointment as soon as possible for a visit in 2 weeks.   Specialty:  Family  Medicine   Contact information:   [place] [address] [phone]         Discharge Instructions  and  Discharge Medications    Discharge Instructions    Diet - low sodium heart healthy    Complete by:  As directed      Discharge instructions    Complete by:  As directed   Follow with Primary MD Eulas Post, MD in 7 days    Activity: As tolerated with Full fall precautions use walker/cane & assistance as needed   Disposition Home    Diet: Heart Healthy , with feeding assistance and aspiration precautions.  For Heart failure patients - Check your Weight same time everyday, if you gain over 2 pounds, or you develop in leg swelling, experience more shortness of breath or chest pain, call your Primary MD immediately. Follow Cardiac Low Salt Diet and 1.5 lit/day fluid restriction.   On your next visit with your primary care physician please Get Medicines reviewed and adjusted.   Please request your Prim.MD to go over all Hospital Tests and Procedure/Radiological results at the follow up, please get all Hospital records sent to your Prim MD by signing hospital release before you go home.   If you experience worsening of your admission symptoms, develop shortness of breath, life threatening emergency, suicidal or homicidal thoughts you must seek medical attention immediately by calling 911 or calling your MD immediately  if symptoms less severe.  You Must read complete instructions/literature along with all the possible adverse reactions/side effects for all the Medicines you take and that have been prescribed to you. Take any new Medicines after you have completely understood and accpet all the possible adverse reactions/side effects.   Do not drive, operating heavy machinery, perform activities at heights, swimming or participation in water activities or provide baby sitting services if your were admitted for syncope or siezures until you have seen  by Primary MD or a Neurologist and advised to do so again.  Do not drive when taking Pain medications.    Do not take more than prescribed Pain, Sleep and Anxiety Medications  Special Instructions: If you have smoked or chewed Tobacco  in the last 2 yrs please stop smoking, stop any regular Alcohol  and or any Recreational drug use.  Wear Seat belts while driving.   Please note  You were cared for by a hospitalist during your hospital stay. If you have any questions about your discharge medications or the care you received while you were in the hospital after you are discharged, you can call the unit and asked to speak with the hospitalist on call if the hospitalist that took care of you is not available. Once you are discharged, your primary care physician will handle  any further medical issues. Please note that NO REFILLS for any discharge medications will be authorized once you are discharged, as it is imperative that you return to your primary care physician (or establish a relationship with a primary care physician if you do not have one) for your aftercare needs so that they can reassess your need for medications and monitor your lab values.     Increase activity slowly    Complete by:  As directed             Medication List    TAKE these medications        atorvastatin 40 MG tablet  Commonly known as:  LIPITOR  Take 1 tablet (40 mg total) by mouth daily at 6 PM.     calcium carbonate 500 MG chewable tablet  Commonly known as:  TUMS - dosed in mg elemental calcium  Chew 1 tablet by mouth daily as needed for indigestion or heartburn.     cetirizine 10 MG tablet  Commonly known as:  ZYRTEC  Take 10 mg by mouth daily.     clopidogrel 75 MG tablet  Commonly known as:  PLAVIX  Take 1 tablet (75 mg total) by mouth daily.     estradiol-norethindrone 0.05-0.14 MG/DAY  Commonly known as:  COMBIPATCH  Place 1 patch onto the skin 2 (two) times a week.     LORazepam 0.5 MG  tablet  Commonly known as:  ATIVAN  take 1 tablet by mouth every 8 hours if needed.     pantoprazole 40 MG tablet  Commonly known as:  PROTONIX  TAKE 1 TABLET (40 MG TOTAL) BY MOUTH DAILY.          Diet and Activity recommendation: See Discharge Instructions above   Consults obtained -  neurology   Major procedures and Radiology Reports - PLEASE review detailed and final reports for all details, in brief -      Ct Head Wo Contrast  01/05/2016  CLINICAL DATA:  55 year old female with left lower extremity weakness. EXAM: CT HEAD WITHOUT CONTRAST TECHNIQUE: Contiguous axial images were obtained from the base of the skull through the vertex without intravenous contrast. COMPARISON:  Head CT dated 04/25/2004 FINDINGS: The ventricles and the sulci are appropriate in size for the patient's age. There is no intracranial hemorrhage. No midline shift or mass effect identified. The gray-white matter differentiation is preserved. The visualized paranasal sinuses and mastoid air cells are well aerated. The calvarium is intact. IMPRESSION: No acute intracranial pathology. Electronically Signed   By: Anner Crete M.D.   On: 01/05/2016 16:07   Mr Brain Wo Contrast  01/05/2016  CLINICAL DATA:  Left-sided weakness.  Visual changes. EXAM: MRI HEAD WITHOUT CONTRAST TECHNIQUE: Multiplanar, multiecho pulse sequences of the brain and surrounding structures were obtained without intravenous contrast. COMPARISON:  CT head 01/05/2016 FINDINGS: Ventricle size normal.  Cerebral volume normal. Negative for acute or chronic infarction Negative for demyelinating disease. Cerebral white matter normal. Brainstem normal. Negative for intracranial hemorrhage or fluid collection. Negative for mass or edema.  No shift of the midline structures Paranasal sinuses clear. Pituitary and skull base normal. Normal orbits. IMPRESSION: Normal MRI of the brain. Electronically Signed   By: Franchot Gallo M.D.   On: 01/05/2016  19:28   Mr Jodene Nam Head/brain Wo Cm  01/05/2016  CLINICAL DATA:  LEFT-sided weakness and numbness beginning at 4:45 p.m. Symptoms now resolved. Evaluate transient ischemic attack. EXAM: MRA HEAD WITHOUT CONTRAST TECHNIQUE: Angiographic images of  the Circle of Willis were obtained using MRA technique without intravenous contrast. COMPARISON:  MRI of the brain January 05, 2016 at 1842 hours FINDINGS: ANTERIOR CIRCULATION: Normal flow related enhancement of the included cervical, petrous, cavernous and supraclinoid internal carotid arteries. Foreshortened, broad anterior communicating artery. Normal flow related enhancement of the anterior and middle cerebral arteries, including distal segments. No large vessel occlusion, high-grade stenosis, abnormal luminal irregularity, aneurysm. POSTERIOR CIRCULATION: LEFT vertebral artery is dominant. Basilar artery is patent, with normal flow related enhancement of the main branch vessels. LEFT posterior communicating artery infundibulum. Robust RIGHT posterior communicating artery present. Normal flow related enhancement of the posterior cerebral arteries. No large vessel occlusion, high-grade stenosis, abnormal luminal irregularity, aneurysm. IMPRESSION: Negative MRA head. Electronically Signed   By: Elon Alas M.D.   On: 01/05/2016 23:59    Micro Results    No results found for this or any previous visit (from the past 240 hour(s)).     Today   Subjective:   Chanice Snoke today has no headache,no chest abdominal pain,no new weakness tingling or numbness, feels much better wants to go home today.   Objective:   Blood pressure 128/77, pulse 79, temperature 98.9 F (37.2 C), temperature source Oral, resp. rate 18, height 5\' 9"  (1.753 m), weight 74.9 kg (165 lb 2 oz), last menstrual period 12/30/2015, SpO2 100 %.  No intake or output data in the 24 hours ending 01/06/16 1717  Exam Awake Alert, Oriented x 3, No new F.N deficits, Normal  affect [place].AT,PERRAL Supple Neck,No JVD, No cervical lymphadenopathy appriciated.  Symmetrical Chest wall movement, Good air movement bilaterally, CTAB RRR,No Gallops,Rubs or new Murmurs, No Parasternal Heave +ve B.Sounds, Abd Soft, Non tender, No organomegaly appriciated, No rebound -guarding or rigidity. No Cyanosis, Clubbing or edema, No new Rash or bruise  Data Review   CBC w Diff:  Lab Results  Component Value Date   WBC 8.7 01/05/2016   HGB 12.7 01/05/2016   HCT 37.7 01/05/2016   PLT 353 01/05/2016   LYMPHOPCT 23 01/05/2016   BANDSPCT 0 01/05/2016   MONOPCT 9 01/05/2016   EOSPCT 1 01/05/2016   BASOPCT 1 01/05/2016    CMP:  Lab Results  Component Value Date   NA 137 01/05/2016   K 3.4* 01/05/2016   CL 105 01/05/2016   CO2 25 01/05/2016   BUN 21* 01/05/2016   CREATININE 0.67 01/05/2016   PROT 7.4 01/05/2016   ALBUMIN 4.4 01/05/2016   BILITOT 0.8 01/05/2016   ALKPHOS 65 01/05/2016   AST 19 01/05/2016   ALT 13* 01/05/2016  .   Total Time in preparing paper work, data evaluation and todays exam - 35 minutes  Immaculate Crutcher M.D on 01/06/2016 at 5:17 PM  [facility]   Office  [phone]

## 2016-01-06 NOTE — Discharge Instructions (Signed)
Follow with Primary MD Eulas Post, MD in 7 days    Activity: As tolerated with Full fall precautions use walker/cane & assistance as needed   Disposition Home    Diet: Heart Healthy , with feeding assistance and aspiration precautions.  For Heart failure patients - Check your Weight same time everyday, if you gain over 2 pounds, or you develop in leg swelling, experience more shortness of breath or chest pain, call your Primary MD immediately. Follow Cardiac Low Salt Diet and 1.5 lit/day fluid restriction.   On your next visit with your primary care physician please Get Medicines reviewed and adjusted.   Please request your Prim.MD to go over all Hospital Tests and Procedure/Radiological results at the follow up, please get all Hospital records sent to your Prim MD by signing hospital release before you go home.   If you experience worsening of your admission symptoms, develop shortness of breath, life threatening emergency, suicidal or homicidal thoughts you must seek medical attention immediately by calling 911 or calling your MD immediately  if symptoms less severe.  You Must read complete instructions/literature along with all the possible adverse reactions/side effects for all the Medicines you take and that have been prescribed to you. Take any new Medicines after you have completely understood and accpet all the possible adverse reactions/side effects.   Do not drive, operating heavy machinery, perform activities at heights, swimming or participation in water activities or provide baby sitting services if your were admitted for syncope or siezures until you have seen by Primary MD or a Neurologist and advised to do so again.  Do not drive when taking Pain medications.    Do not take more than prescribed Pain, Sleep and Anxiety Medications  Special Instructions: If you have smoked or chewed Tobacco  in the last 2 yrs please stop smoking, stop any regular Alcohol  and or  any Recreational drug use.  Wear Seat belts while driving.   Please note  You were cared for by a hospitalist during your hospital stay. If you have any questions about your discharge medications or the care you received while you were in the hospital after you are discharged, you can call the unit and asked to speak with the hospitalist on call if the hospitalist that took care of you is not available. Once you are discharged, your primary care physician will handle any further medical issues. Please note that NO REFILLS for any discharge medications will be authorized once you are discharged, as it is imperative that you return to your primary care physician (or establish a relationship with a primary care physician if you do not have one) for your aftercare needs so that they can reassess your need for medications and monitor your lab values.

## 2016-01-06 NOTE — Evaluation (Signed)
Speech Language Pathology Evaluation Patient Details Name: Kelly Woodward MRN: [id number] DOB: 1960-11-18 Today's Date: 01/06/2016 Time: [id number] SLP Time Calculation (min) (ACUTE ONLY): 21 min  Problem List:  Patient Active Problem List   Diagnosis Date Noted  . TIA (transient ischemic attack) 01/05/2016  . Hypokalemia 01/05/2016  . GERD (gastroesophageal reflux disease)   . Gastroesophageal reflux disease without esophagitis   . Tobacco abuse   . Anxiety 09/16/2010  . Shoulder pain 09/16/2010  . 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 Seasonal and perennial allergic rhinitis 01/09/2007  . HELLP SYNDROME 01/09/2007  . PRE-EC/ECLAMPSIA ON PRE-X HTN, ANTEPARTUM 01/09/2007  . NEPHROLITHIASIS, HX OF 01/09/2007   Past Medical History:  Past Medical History  Diagnosis Date  . Allergy     Rhinitis  . History of nephrolithiasis 1996  . Osteopenia   . GERD (gastroesophageal reflux disease)   . Peptic ulcer     need to avoid ASA per pt.  . Tobacco abuse    Past Surgical History:  Past Surgical History  Procedure Laterality Date  . Cyst on vocal chord  1998   HPI:  Kelly Woodward is a 55 y.o. female presents with acute onset of left-sided numbness followed by mild weakness involving the arms face and leg. Onset of symptoms 4:45 PM. Symptoms completely resolved within 2-2-1/2 hours.   Assessment / Plan / Recommendation Clinical Impression  Pt referred for speech/language/cognition evaluation following presentation to the ED with stroke-like symptoms. Pt performed WNL in all areas assessed. Functional math problem solving 5/5 acc. Long term and recent memory WNL. Speech/language production WNL. Reading/writing WNL. No further SLP services warranted. Encouraged pt to exercise caution with high level cognitive linguistic tasks upon return home. Pt verbalized agreement.    SLP Assessment  Patient does not need any further Speech Lanaguage Pathology Services    Follow Up  Recommendations  None    Frequency and Duration           SLP Evaluation Prior Functioning  Cognitive/Linguistic Baseline: Within functional limits Type of Home: House  Lives With: Spouse Available Help at Discharge: Family Vocation:  (music therapist- Biomedical engineer)   Cognition  Overall Cognitive Status: Within Functional Limits for tasks assessed Arousal/Alertness: Awake/alert Orientation Level: Oriented X4 Memory: Appears intact Awareness: Appears intact Problem Solving: Appears intact Safety/Judgment: Appears intact    Comprehension  Auditory Comprehension Overall Auditory Comprehension: Appears within functional limits for tasks assessed Yes/No Questions: Within Functional Limits Commands: Within Functional Limits Reading Comprehension Reading Status: Within funtional limits    Expression Expression Primary Mode of Expression: Verbal Verbal Expression Overall Verbal Expression: Appears within functional limits for tasks assessed Written Expression Dominant Hand: Right Written Expression: Within Functional Limits   Oral / Motor  Oral Motor/Sensory Function Overall Oral Motor/Sensory Function: Within functional limits Motor Speech Overall Motor Speech: Appears within functional limits for tasks assessed   GO          Functional Assessment Tool Used: clinician judgement Functional Limitations: Spoken language expressive Spoken Language Expression Current Status [id number]): 0 percent impaired, limited or restricted Spoken Language Expression Goal Status [id number]): 0 percent impaired, limited or restricted Spoken Language Expression Discharge Status [id number]): 0 percent impaired, limited or restricted         Vinetta Bergamo MA, [place] Pager [phone] 01/06/2016, 2:57 PM

## 2016-01-06 NOTE — Progress Notes (Signed)
PT Cancellation Note  Patient Details Name: Kelly Woodward MRN: [id number] DOB: [birth year]   Cancelled Treatment:    Reason Eval/Treat Not Completed: PT screened, no needs identified, will sign off. Per OT, pt functioning indep and is back to baseline. Pt with no current acute PT needs at this time. PT SIGNING OFF. Please re-consult if needed. Thank you for referral!   Kingsley Callander 01/06/2016, 10:34 AM   Kittie Plater, PT, DPT Pager #: [phone] Office #: [phone]

## 2016-01-06 NOTE — Evaluation (Signed)
Occupational Therapy Evaluation Patient Details Name: Kelly Woodward MRN: [id number] DOB: 01/22/1961 Today's Date: 01/06/2016    History of Present Illness 55 yo female admitted with L side weakness and numbness. MRI normal    Clinical Impression   Patient evaluated by Occupational Therapy with no further acute OT needs identified. All education has been completed and the patient has no further questions. See below for any follow-up Occupational Therapy or equipment needs. OT to sign off. Thank you for referral.      Follow Up Recommendations  No OT follow up    Equipment Recommendations  None recommended by OT    Recommendations for Other Services       Precautions / Restrictions Precautions Precautions: None      Mobility Bed Mobility Overal bed mobility: Independent                Transfers Overall transfer level: Independent                    Balance Overall balance assessment: Independent                               Standardized Balance Assessment Standardized Balance Assessment : Dynamic Gait Index   Dynamic Gait Index Level Surface: Normal Change in Gait Speed: Normal Gait with Horizontal Head Turns: Normal Gait with Vertical Head Turns: Normal Gait and Pivot Turn: Normal Step Over Obstacle: Normal Step Around Obstacles: Normal Steps: Normal Total Score: 24      ADL Overall ADL's : Independent                                             Vision Vision Assessment?: No apparent visual deficits   Perception     Praxis      Pertinent Vitals/Pain Pain Assessment: No/denies pain     Hand Dominance Right   Extremity/Trunk Assessment Upper Extremity Assessment Upper Extremity Assessment: Overall WFL for tasks assessed   Lower Extremity Assessment Lower Extremity Assessment: Overall WFL for tasks assessed   Cervical / Trunk Assessment Cervical / Trunk Assessment: Normal   Communication  Communication Communication: No difficulties   Cognition Arousal/Alertness: Awake/alert Behavior During Therapy: WFL for tasks assessed/performed Overall Cognitive Status: Within Functional Limits for tasks assessed                     General Comments       Exercises       Shoulder Instructions      Home Living Family/patient expects to be discharged to:: Private residence Living Arrangements: Spouse/significant other;Children Available Help at Discharge: Family Type of Home: House       Home Layout: One level     Bathroom Shower/Tub: Occupational psychologist: Standard     Home Equipment: None          Prior Functioning/Environment Level of Independence: Independent             OT Diagnosis:     OT Problem List:     OT Treatment/Interventions:      OT Goals(Current goals can be found in the care plan section)    OT Frequency:     Barriers to D/C:            Co-evaluation  End of Session Equipment Utilized During Treatment: Gait belt Nurse Communication: Mobility status;Precautions  Activity Tolerance: Patient tolerated treatment well Patient left: in bed;with call bell/phone within reach   Time: [id number] OT Time Calculation (min): 14 min Charges:  OT General Charges $OT Visit: 1 Procedure OT Evaluation $OT Eval Low Complexity: 1 Procedure G-Codes:    Kelly Woodward January 10, 2016, 1:18 PM  Kelly Woodward   OTR/L Pager: [phone] Office: [phone] .

## 2016-01-06 NOTE — Progress Notes (Signed)
   01/06/16 1015  Clinical Encounter Type  Visited With Patient  Visit Type Follow-up  Spiritual Encounters  Spiritual Needs Emotional  Stress Factors  Patient Stress Factors Not reviewed  Chaplain on rounds stopped in to see patient.  Chaplain asked patient if there was any way of being of support to her.  Chaplain and patient had a nice conversation. Chaplain offered to stop by  on afternoon rounds,

## 2016-01-06 NOTE — Progress Notes (Signed)
Patient is discharged from room 5M09 at this time. Alert and  in stable condition. IV d/c'd as well as tele. Instructions read to patient with understanding verbalized. Left unit via wheelchair with all belongings at side.

## 2016-01-06 NOTE — Progress Notes (Signed)
STROKE TEAM PROGRESS NOTE   HISTORY OF PRESENT ILLNESS (per record) Kelly Woodward is a 55 y.o. female presents with acute onset of left-sided numbness followed by mild weakness involving the arms face and leg. Onset of symptoms 4:45 PM 01/05/2016 (LKW). Symptoms completely resolved within 2-2-1/2 hours. History of tobacco dependence. Denies any history of diabetes hypertension and hyperlipidemia or heart disease. She is on hormone replacement therapy patch for menopausal symptoms. MRI brain was performed which is negative for acute stroke, EKG showed sinus rhythm Currently denies any chest pain palpitation headache no change in vision, no chin bowel or urinary habits. Patient was not administered IV t-PA. She was admitted for further evaluation and treatment.   SUBJECTIVE (INTERVAL HISTORY) I obtained a detailed history of presenting illness from patient. She is worried that she had a long 33 hour plane ride from [place] 6 days prior to her presentation which may have something to do with her episode. She actually describes the left foot drop along with numbness prior to developing left arm and cheek numbness. MRI scan shows no acute infarct.   OBJECTIVE Temp:  [97.8 F (36.6 C)-99.2 F (37.3 C)] 97.8 F (36.6 C) (06/13 0800) Pulse Rate:  [83-110] 84 (06/13 0800) Cardiac Rhythm:  [-] Normal sinus rhythm (06/12 2236) Resp:  [10-20] 18 (06/13 0800) BP: (111-164)/(64-109) 137/91 mmHg (06/13 0800) SpO2:  [100 %] 100 % (06/13 0800) Weight:  [71.668 kg (158 lb)-74.9 kg (165 lb 2 oz)] 74.9 kg (165 lb 2 oz) (06/12 2148)  CBC:  Recent Labs Lab 01/05/16 1550  WBC 8.7  NEUTROABS 5.7  HGB 12.7  HCT 37.7  MCV 92.0  PLT 0000000    Basic Metabolic Panel:  Recent Labs Lab 01/05/16 1550 01/06/16 0405  NA 137  --   K 3.4*  --   CL 105  --   CO2 25  --   GLUCOSE 114*  --   BUN 21*  --   CREATININE 0.67  --   CALCIUM 9.7  --   MG  --  2.0    Lipid Panel:    Component Value Date/Time   CHOL  152 01/06/2016 0405   TRIG 78 01/06/2016 0405   HDL 42 01/06/2016 0405   CHOLHDL 3.6 01/06/2016 0405   VLDL 16 01/06/2016 0405   LDLCALC 94 01/06/2016 0405   HgbA1c:  Lab Results  Component Value Date   HGBA1C 5.5 09/11/2009   Urine Drug Screen:    Component Value Date/Time   LABOPIA NONE DETECTED 01/05/2016 1700   COCAINSCRNUR NONE DETECTED 01/05/2016 1700   LABBENZ NONE DETECTED 01/05/2016 1700   AMPHETMU NONE DETECTED 01/05/2016 1700   THCU NONE DETECTED 01/05/2016 1700   LABBARB NONE DETECTED 01/05/2016 1700      IMAGING  Ct Head Wo Contrast  01/05/2016  CLINICAL DATA:  55 year old female with left lower extremity weakness. EXAM: CT HEAD WITHOUT CONTRAST TECHNIQUE: Contiguous axial images were obtained from the base of the skull through the vertex without intravenous contrast. COMPARISON:  Head CT dated 04/25/2004 FINDINGS: The ventricles and the sulci are appropriate in size for the patient's age. There is no intracranial hemorrhage. No midline shift or mass effect identified. The gray-white matter differentiation is preserved. The visualized paranasal sinuses and mastoid air cells are well aerated. The calvarium is intact. IMPRESSION: No acute intracranial pathology. Electronically Signed   By: Anner Crete M.D.   On: 01/05/2016 16:07   Mr Brain Wo Contrast  01/05/2016  CLINICAL  DATA:  Left-sided weakness.  Visual changes. EXAM: MRI HEAD WITHOUT CONTRAST TECHNIQUE: Multiplanar, multiecho pulse sequences of the brain and surrounding structures were obtained without intravenous contrast. COMPARISON:  CT head 01/05/2016 FINDINGS: Ventricle size normal.  Cerebral volume normal. Negative for acute or chronic infarction Negative for demyelinating disease. Cerebral white matter normal. Brainstem normal. Negative for intracranial hemorrhage or fluid collection. Negative for mass or edema.  No shift of the midline structures Paranasal sinuses clear. Pituitary and skull base normal.  Normal orbits. IMPRESSION: Normal MRI of the brain. Electronically Signed   By: Franchot Gallo M.D.   On: 01/05/2016 19:28   Mr Jodene Nam Head/brain Wo Cm  01/05/2016  CLINICAL DATA:  LEFT-sided weakness and numbness beginning at 4:45 p.m. Symptoms now resolved. Evaluate transient ischemic attack. EXAM: MRA HEAD WITHOUT CONTRAST TECHNIQUE: Angiographic images of the Circle of Willis were obtained using MRA technique without intravenous contrast. COMPARISON:  MRI of the brain January 05, 2016 at 1842 hours FINDINGS: ANTERIOR CIRCULATION: Normal flow related enhancement of the included cervical, petrous, cavernous and supraclinoid internal carotid arteries. Foreshortened, broad anterior communicating artery. Normal flow related enhancement of the anterior and middle cerebral arteries, including distal segments. No large vessel occlusion, high-grade stenosis, abnormal luminal irregularity, aneurysm. POSTERIOR CIRCULATION: LEFT vertebral artery is dominant. Basilar artery is patent, with normal flow related enhancement of the main branch vessels. LEFT posterior communicating artery infundibulum. Robust RIGHT posterior communicating artery present. Normal flow related enhancement of the posterior cerebral arteries. No large vessel occlusion, high-grade stenosis, abnormal luminal irregularity, aneurysm. IMPRESSION: Negative MRA head. Electronically Signed   By: Elon Alas M.D.   On: 01/05/2016 23:59       PHYSICAL EXAM Pleasant middle age 14 lady currently not in distress. . Afebrile. Head is nontraumatic. Neck is supple without bruit.    Cardiac exam no murmur or gallop. Lungs are clear to auscultation. Distal pulses are well felt. Neurological Exam ;  Awake  Alert oriented x 3. Normal speech and language.eye movements full without nystagmus.fundi were not visualized. Vision acuity and fields appear normal. Hearing is normal. Palatal movements are normal. Face symmetric. Tongue midline. Normal strength,  tone, reflexes and coordination. Normal sensation. Gait deferred. ASSESSMENT/PLAN Kelly Woodward is a 55 y.o. female with history of allergy, tobacco abuse, GERD, peptic ulcer, anxiety presenting with left sided numbness followed by left sided weakness arm, face and leg. She did not receive IV t-PA.   Rt brainTIA  MRI  normal  MRA  Unremarkable   Carotid Doppler  pending  2D Echo  Pending  LDL 94  HgbA1c pending  SCdsfor VTE prophylaxis  Diet Heart Room service appropriate?: Yes; Fluid consistency:: Thin  No antithrombotic prior to admission, now on clopidogrel 75 mg daily  Patient counseled to be compliant with her antithrombotic medications  Ongoing aggressive stroke risk factor management  Therapy recommendations:  pending  Disposition:  home  Hypertension   Stable  Permissive hypertension (OK if < 220/120) but gradually normalize in 5-7 days  Long-term BP goal normotensive  Hyperlipidemia  Home meds:  No statin  LDL 64, goal < 70   No indication for statin at discharge  Other Stroke Risk Factors Non Cigarette smoker  Other Active Problems  Hx peptic ulcer disease, patient desires to avoid ASA  Seasonal and perennial allergic rhinitis  Anxiety  GERD  Positive UA, Cx pending  Hospital day #   I have personally examined this patient, reviewed notes, independently viewed imaging studies,  participated in medical decision making and plan of care. I have made any additions or clarifications directly to the above note. Agree with note above.  She presented with transient left leg followed by and cheek numbness possibly a right brain subcortical TIA. She also complained of left foot drop and numbness which is more suggestive of peroneal neuropathy or lumbar radiculopathy. She remains at risk for neurological worsening, recurrent stroke, TIA needs ongoing stroke evaluation. Recommend Plavix over aspirin given history of GI irritation with aspirin in the  past. Discuss with Dr. Urban Gibson. Greater than 50% time during this 35 minute visit was spent on counseling and coordination of care about stroke risk and prevention  Antony Contras, MD Medical Director [place] Pager: [phone] 01/06/2016 4:46 PM     To contact Stroke Continuity provider, please refer to [email or web address] After hours, contact General Neurology

## 2016-01-06 NOTE — Care Management Note (Signed)
Case Management Note  Patient Details  Name: Kelly Woodward MRN: [id number] Date of Birth: 06/05/1961  Subjective/Objective:    Pt admitted with TIA. MRI results negative. She is from home with spouse.                Action/Plan: Awaiting PT rec. OT with no f/u. CM following for d/c needs.   Expected Discharge Date:                  Expected Discharge Plan:     In-House Referral:     Discharge planning Services     Post Acute Care Choice:    Choice offered to:     DME Arranged:    DME Agency:     HH Arranged:    HH Agency:     Status of Service:  In process, will continue to follow  Medicare Important Message Given:    Date Medicare IM Given:    Medicare IM give by:    Date Additional Medicare IM Given:    Additional Medicare Important Message give by:     If discussed at [place] of Stay Meetings, dates discussed:    Additional Comments:  Pollie Friar, RN 01/06/2016, 4:35 PM

## 2016-01-07 LAB — URINE CULTURE

## 2016-01-07 LAB — HEMOGLOBIN A1C
Hgb A1c MFr Bld: 5.3 % (ref 4.8–5.6)
MEAN PLASMA GLUCOSE: 105 mg/dL

## 2016-01-08 ENCOUNTER — Other Ambulatory Visit: Payer: Self-pay | Admitting: Family Medicine

## 2016-01-12 ENCOUNTER — Ambulatory Visit (INDEPENDENT_AMBULATORY_CARE_PROVIDER_SITE_OTHER): Payer: BLUE CROSS/BLUE SHIELD | Admitting: Family Medicine

## 2016-01-12 VITALS — BP 110/84 | HR 113 | Temp 97.8°F | Ht 69.0 in | Wt 161.0 lb

## 2016-01-12 DIAGNOSIS — G459 Transient cerebral ischemic attack, unspecified: Secondary | ICD-10-CM

## 2016-01-12 MED ORDER — ROSUVASTATIN CALCIUM 10 MG PO TABS: 10.0000 mg | ORAL_TABLET | Freq: Every day | ORAL | Status: DC

## 2016-01-12 NOTE — Patient Instructions (Signed)
Start statin medication. Start baby aspirin one daily. Follow up immediately for any recurrent or new symptoms.

## 2016-01-12 NOTE — Progress Notes (Signed)
Pre visit review using our clinic review tool, if applicable. No additional management support is needed unless otherwise documented below in the visit note. 

## 2016-01-12 NOTE — Progress Notes (Signed)
Subjective:    Patient ID: Kelly Woodward, female    DOB: 08-31-1960, 55 y.o.   MRN: [id number]  HPI Patient seen for hospital follow-up. She was admitted on June 12 after experiencing some transient weakness in her left lower extremity followed by some very transient numbness involving her left arm and left face. Concern was for TIA. She noticed some difficulty walking and left foot weakness and lower leg weakness for about 2.5 hours. She denied any vision change or any slurred speech. No chest pains or shortness of breath. She subsequently developed about 15 minutes of transient left upper extremity numbness and left face numbness without any obvious weakness. Initial lab work mostly unremarkable. CT head no acute abnormalities.  She was admitted and had extensive workup. She had MRI of the brain along with MR angiogram with no acute findings. Carotid Dopplers unremarkable. Blood sugars normal. LDL cholesterol 94. Patient was discharged on Plavix and Lipitor. She was given 1 dose of Lipitor prior to discharge and developed nausea and vomiting and she never got prescription filled. She also did not start Plavix as she had a preference for ecotrin coated aspirin first.  She's had no recurrent symptoms whatsoever. No history of migraines. She was infrequently smoking-about 5 or 6 cigarettes per week and has quit that altogether. Recent labs:  Lab Results  Component Value Date   CHOL 152 01/06/2016   HDL 42 01/06/2016   LDLCALC 94 01/06/2016   TRIG 78 01/06/2016   CHOLHDL 3.6 01/06/2016    Lab Results  Component Value Date   HGBA1C 5.3 01/06/2016     Past Medical History  Diagnosis Date  . Allergy     Rhinitis  . History of nephrolithiasis 1996  . Osteopenia   . GERD (gastroesophageal reflux disease)   . Peptic ulcer     need to avoid ASA per pt.  . Tobacco abuse    Past Surgical History  Procedure Laterality Date  . Cyst on vocal chord  1998    reports that she has been  smoking.  She has never used smokeless tobacco. She reports that she does not drink alcohol or use illicit drugs. family history includes Cancer in her daughter; Hypertension in her mother. Allergies  Allergen Reactions  . Astelin Other (See Comments)    nosebleeds      Review of Systems  Constitutional: Negative for fatigue.  HENT: Negative for trouble swallowing.   Eyes: Negative for visual disturbance.  Respiratory: Negative for cough, chest tightness, shortness of breath and wheezing.   Cardiovascular: Negative for chest pain, palpitations and leg swelling.  Gastrointestinal: Negative for abdominal pain.  Neurological: Negative for dizziness, seizures, syncope, speech difficulty, weakness, light-headedness and headaches.  Psychiatric/Behavioral: Negative for confusion.       Objective:   Physical Exam  Constitutional: She is oriented to person, place, and time. She appears well-developed and well-nourished.  Eyes: Pupils are equal, round, and reactive to light.  Neck: Neck supple. No JVD present. No thyromegaly present.  Cardiovascular: Normal rate and regular rhythm.  Exam reveals no gallop.   Pulmonary/Chest: Effort normal and breath sounds normal. No respiratory distress. She has no wheezes. She has no rales.  Musculoskeletal: She exhibits no edema.  Neurological: She is alert and oriented to person, place, and time. No cranial nerve deficit. Coordination normal.  Gait normal. No focal strength deficits.          Assessment & Plan:  Patient was recently mid with possible  TIA. Overall, relatively low risk factors. She has quit smoking altogether since then it was very light use prior to admission. No recurrent symptoms. We've recommended the following: -Continued total abstinence from tobacco use -Baby aspirin 81 mg daily -Start Crestor 10 mg daily (did not tolerate Lipitor in hospital) -We'll plan repeat lipids and hepatic panel in 2 months -Follow-up immediately  for any recurrent symptoms  Eulas Post MD [facility] at [facility]

## 2016-02-26 ENCOUNTER — Other Ambulatory Visit: Payer: Self-pay | Admitting: Family Medicine

## 2016-02-26 NOTE — Telephone Encounter (Signed)
Refill once.  She does not take this regularly.

## 2016-04-15 DIAGNOSIS — Z01419 Encounter for gynecological examination (general) (routine) without abnormal findings: Secondary | ICD-10-CM | POA: Diagnosis not present

## 2016-04-15 DIAGNOSIS — Z6824 Body mass index (BMI) 24.0-24.9, adult: Secondary | ICD-10-CM | POA: Diagnosis not present

## 2016-04-15 DIAGNOSIS — R300 Dysuria: Secondary | ICD-10-CM | POA: Diagnosis not present

## 2016-04-15 DIAGNOSIS — Z1231 Encounter for screening mammogram for malignant neoplasm of breast: Secondary | ICD-10-CM | POA: Diagnosis not present

## 2016-04-21 ENCOUNTER — Encounter: Payer: Self-pay | Admitting: Family Medicine

## 2016-04-30 ENCOUNTER — Other Ambulatory Visit: Payer: Self-pay | Admitting: Family Medicine

## 2016-05-19 ENCOUNTER — Other Ambulatory Visit: Payer: Self-pay | Admitting: Family Medicine

## 2016-05-25 ENCOUNTER — Ambulatory Visit (INDEPENDENT_AMBULATORY_CARE_PROVIDER_SITE_OTHER): Payer: BLUE CROSS/BLUE SHIELD | Admitting: Family Medicine

## 2016-05-25 VITALS — BP 130/90 | HR 92 | Temp 98.3°F | Ht 69.0 in | Wt 169.0 lb

## 2016-05-25 DIAGNOSIS — J302 Other seasonal allergic rhinitis: Secondary | ICD-10-CM | POA: Diagnosis not present

## 2016-05-25 MED ORDER — METHYLPREDNISOLONE ACETATE 80 MG/ML IJ SUSP
80.0000 mg | Freq: Once | INTRAMUSCULAR | Status: AC
  Administered 2016-05-25: 80 mg via INTRAMUSCULAR

## 2016-05-25 NOTE — Progress Notes (Signed)
Subjective:     Patient ID: Kelly Woodward, female   DOB: 15-Sep-1960, 55 y.o.   MRN: [id number]  HPI Patient seen with increased allergy symptoms.  She has long history of seasonal and perennial allergies. She's had couple weeks now of intermittent bilateral eye swelling and itching along with postnasal drip and nasal congestion. She takes Zyrtec regularly and has been recently requiring use of pro-air inhaler as needed. She also uses an allergy eyedrop. 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 She uses these very infrequently. She has had better tolerance with IM steroids such as Depo-Medrol vs oral steroids.  Past Medical History:  Diagnosis Date  . Allergy    Rhinitis  . GERD (gastroesophageal reflux disease)   . History of nephrolithiasis 1996  . Osteopenia   . Peptic ulcer    need to avoid ASA per pt.  . Tobacco abuse    Past Surgical History:  Procedure Laterality Date  . cyst on vocal chord  1998    reports that she has been smoking.  She has never used smokeless tobacco. She reports that she does not drink alcohol or use drugs. family history includes Cancer in her daughter; Hypertension in her mother. Allergies  Allergen Reactions  . Astelin Other (See Comments)    nosebleeds     Review of Systems  Constitutional: Positive for fatigue. Negative for chills and fever.  HENT: Positive for congestion, postnasal drip and sneezing. Negative for sore throat.   Respiratory: Positive for cough and wheezing.        Objective:   Physical Exam  Constitutional: She appears well-developed and well-nourished.  HENT:  Right Ear: External ear normal.  Left Ear: External ear normal.  Mouth/Throat: Oropharynx is clear and moist.  Neck: Neck supple.  Cardiovascular: Normal rate and regular rhythm.   Pulmonary/Chest: Effort normal and breath sounds normal. No respiratory  distress. She has no wheezes. She has no rales.  Lymphadenopathy:    She has no cervical adenopathy.       Assessment:     -Seasonal allergic rhinitis with recent exacerbation not controlled with her usual medications    Plan:     -Depo-Medrol 80 mg IM given. She knows that this cannot be given frequently. -Continue her Zyrtec and consider addition of nasal steroid such as Nasacort or Flonase  Eulas Post MD [facility] at [facility]

## 2016-05-25 NOTE — Progress Notes (Signed)
Pre visit review using our clinic review tool, if applicable. No additional management support is needed unless otherwise documented below in the visit note. 

## 2016-07-13 ENCOUNTER — Other Ambulatory Visit: Payer: Self-pay | Admitting: Family Medicine

## 2016-07-13 NOTE — Telephone Encounter (Signed)
Last refill 05/20/2016 #30 Last OV 01-12-2016 Please advise

## 2016-07-13 NOTE — Telephone Encounter (Signed)
I would recommend avoid regular use.  Would only be taking for severe anxiety symptoms.  Refill #20.  If she is having frequent daily anxiety symptoms offer follow up to discuss possible medications for suppression such as SSRIs.

## 2016-07-28 ENCOUNTER — Other Ambulatory Visit: Payer: Self-pay | Admitting: Family Medicine

## 2016-09-15 ENCOUNTER — Ambulatory Visit (INDEPENDENT_AMBULATORY_CARE_PROVIDER_SITE_OTHER): Payer: BLUE CROSS/BLUE SHIELD | Admitting: Family Medicine

## 2016-09-15 DIAGNOSIS — R5383 Other fatigue: Secondary | ICD-10-CM | POA: Diagnosis not present

## 2016-09-15 DIAGNOSIS — R202 Paresthesia of skin: Secondary | ICD-10-CM | POA: Diagnosis not present

## 2016-09-15 LAB — BASIC METABOLIC PANEL
BUN: 19 mg/dL (ref 6–23)
CO2: 28 meq/L (ref 19–32)
Calcium: 9.3 mg/dL (ref 8.4–10.5)
Chloride: 105 mEq/L (ref 96–112)
Creatinine, Ser: 0.67 mg/dL (ref 0.40–1.20)
GFR: 96.88 mL/min (ref >60.00)
GLUCOSE: 91 mg/dL (ref 70–99)
POTASSIUM: 3.9 meq/L (ref 3.5–5.1)
SODIUM: 139 meq/L (ref 135–145)

## 2016-09-15 LAB — TSH: TSH: 1.04 u[IU]/mL (ref 0.35–4.50)

## 2016-09-15 LAB — VITAMIN B12: Vitamin B-12: 285 pg/mL (ref 211–911)

## 2016-09-15 MED ORDER — LORAZEPAM 0.5 MG PO TABS: 0.5000 mg | ORAL_TABLET | Freq: Three times a day (TID) | ORAL | 0 refills | Status: DC

## 2016-09-15 NOTE — Progress Notes (Signed)
Pre visit review using our clinic review tool, if applicable. No additional management support is needed unless otherwise documented below in the visit note. 

## 2016-09-15 NOTE — Patient Instructions (Signed)
Paresthesia Introduction Paresthesia is an abnormal burning or prickling sensation. This sensation is generally felt in the hands, arms, legs, or feet. However, it may occur in any part of the body. Usually, it is not painful. The feeling may be described as:  Tingling or numbness.  Pins and needles.  Skin crawling.  Buzzing.  Limbs falling asleep.  Itching. Most people experience temporary (transient) paresthesia at some time in their lives. Paresthesia may occur when you breathe too quickly (hyperventilation). It can also occur without any apparent cause. Commonly, paresthesia occurs when pressure is placed on a nerve. The sensation quickly goes away after the pressure is removed. For some people, however, paresthesia is a long-lasting (chronic) condition that is caused by an underlying disorder. If you continue to have paresthesia, you may need further medical evaluation. Follow these instructions at home: Watch your condition for any changes. Taking the following actions may help to lessen any discomfort that you are feeling:  Avoid drinking alcohol.  Try acupuncture or massage to help relieve your symptoms.  Keep all follow-up visits as directed by your health care provider. This is important. Contact a health care provider if:  You continue to have episodes of paresthesia.  Your burning or prickling feeling gets worse when you walk.  You have pain, cramps, or dizziness.  You develop a rash. Get help right away if:  You feel weak.  You have trouble walking or moving.  You have problems with speech, understanding, or vision.  You feel confused.  You cannot control your bladder or bowel movements.  You have numbness after an injury.  You faint. This information is not intended to replace advice given to you by your health care provider. Make sure you discuss any questions you have with your health care provider. Document Released: 07/02/2002 Document Revised:  12/18/2015 Document Reviewed: 07/08/2014  2017 Elsevier

## 2016-09-15 NOTE — Progress Notes (Signed)
Subjective:     Patient ID: Kelly Woodward, female   DOB: [birth year], 56 y.o.   MRN: [id number]  HPI Patient seen with several month history of intermittent paresthesias involving her hands and occasionally her left lateral leg. Last summer she had question of TIA. She had MRI and MRA angiogram  which showed no evidence for demyelinating disease and no evidence for CVA. She has multitude of symptoms including progressive fatigue and what she describes as "brain fog ". She has history of chronic PPI use and had done some reading and is concerned about possible B12 deficiency. She is not a vegan. No history of gastric surgery. No history of diabetes. She had A1c in low 5 range last summer. She's not had thyroid functions or B12 levels recently. Rare alcohol use. Denies any lumbar back pain. Question of intermittent weakness left lower extremity. Denies any urine or stool incontinence. No speech changes. No headaches.  Past Medical History:  Diagnosis Date  . Allergy    Rhinitis  . GERD (gastroesophageal reflux disease)   . History of nephrolithiasis 1996  . Osteopenia   . Peptic ulcer    need to avoid ASA per pt.  . Tobacco abuse    Past Surgical History:  Procedure Laterality Date  . cyst on vocal chord  1998    reports that she has been smoking.  She has never used smokeless tobacco. She reports that she does not drink alcohol or use drugs. family history includes Cancer in her daughter; Hypertension in her mother. Allergies  Allergen Reactions  . Astelin Other (See Comments)    nosebleeds     Review of Systems  Constitutional: Negative for chills, fever and unexpected weight change.  Eyes: Negative for visual disturbance.  Respiratory: Negative for shortness of breath.   Cardiovascular: Negative for chest pain and leg swelling.  Genitourinary: Negative for dysuria.  Neurological: Positive for numbness. Negative for dizziness, seizures, syncope, weakness and headaches.   Psychiatric/Behavioral: Negative for confusion.       Objective:   Physical Exam  Constitutional: She appears well-developed and well-nourished.  Cardiovascular: Normal rate and regular rhythm.   Pulmonary/Chest: Effort normal and breath sounds normal. No respiratory distress. She has no wheezes. She has no rales.  Musculoskeletal: She exhibits no edema.  Straight leg raise are negative bilaterally. No peripheral edema.  Neurological:  Full strength upper and lower extremities. She has symmetric reflexes upper and lower extremities. Normal sensory function to touch. Gait normal.       Assessment:     Patient presents with intermittent paresthesias involving both upper extremities and occasional left lower extremity. Recent MRI and MR angiogram as above unremarkable with no demyelinating disease. No history of diabetes. No regular alcohol use. Rule out other etiologies such as hypothyroidism, B12 deficiency.  She does have risk factor for B12 deficiency of chronic PPI use    Plan:     -Check further labs with TSH, B12, serum protein electrophoresis -If all the above normal consider neurology referral  Eulas Post MD [facility] at [facility]

## 2016-09-16 ENCOUNTER — Encounter: Payer: Self-pay | Admitting: Family Medicine

## 2016-09-17 LAB — PROTEIN ELECTROPHORESIS, SERUM
Albumin ELP: 4.1 g/dL (ref 3.8–4.8)
Alpha-1-Globulin: 0.3 g/dL (ref 0.2–0.3)
Alpha-2-Globulin: 0.7 g/dL (ref 0.5–0.9)
BETA GLOBULIN: 0.4 g/dL (ref 0.4–0.6)
Beta 2: 0.4 g/dL (ref 0.2–0.5)
Gamma Globulin: 0.9 g/dL (ref 0.8–1.7)
TOTAL PROTEIN, SERUM ELECTROPHOR: 6.8 g/dL (ref 6.1–8.1)

## 2016-09-21 ENCOUNTER — Other Ambulatory Visit: Payer: Self-pay

## 2016-09-21 DIAGNOSIS — R202 Paresthesia of skin: Secondary | ICD-10-CM

## 2016-10-27 ENCOUNTER — Other Ambulatory Visit: Payer: Self-pay | Admitting: Family Medicine

## 2016-11-09 ENCOUNTER — Other Ambulatory Visit: Payer: Self-pay | Admitting: Family Medicine

## 2016-11-09 NOTE — Telephone Encounter (Signed)
Advise getting away from prn use.  May refill #15 but would take off list as regular medication.

## 2016-11-09 NOTE — Telephone Encounter (Signed)
Left message on machine for patient to return our call 

## 2016-11-09 NOTE — Telephone Encounter (Signed)
LORazepam (ATIVAN) 0.5 MG tablet Last office visit and refill was 09/15/16 Okay to fill?

## 2016-11-10 NOTE — Telephone Encounter (Signed)
OK 

## 2016-11-10 NOTE — Telephone Encounter (Signed)
Patient states that she takes the medication only every other night for sleep.  She does have an appointment with neurology, but not until 11/24/16.  Rx not called in.  She would like to pick up #20 until she can see the neurologist.  Okay to fill?

## 2016-11-24 ENCOUNTER — Ambulatory Visit (INDEPENDENT_AMBULATORY_CARE_PROVIDER_SITE_OTHER): Payer: BLUE CROSS/BLUE SHIELD | Admitting: Neurology

## 2016-11-24 ENCOUNTER — Other Ambulatory Visit (INDEPENDENT_AMBULATORY_CARE_PROVIDER_SITE_OTHER): Payer: BLUE CROSS/BLUE SHIELD

## 2016-11-24 ENCOUNTER — Encounter: Payer: Self-pay | Admitting: Neurology

## 2016-11-24 VITALS — BP 130/80 | HR 100 | Ht 69.0 in | Wt 163.2 lb

## 2016-11-24 DIAGNOSIS — R202 Paresthesia of skin: Secondary | ICD-10-CM

## 2016-11-24 DIAGNOSIS — H539 Unspecified visual disturbance: Secondary | ICD-10-CM | POA: Diagnosis not present

## 2016-11-24 DIAGNOSIS — R413 Other amnesia: Secondary | ICD-10-CM

## 2016-11-24 DIAGNOSIS — H905 Unspecified sensorineural hearing loss: Secondary | ICD-10-CM

## 2016-11-24 DIAGNOSIS — H9190 Unspecified hearing loss, unspecified ear: Secondary | ICD-10-CM

## 2016-11-24 LAB — VITAMIN D 25 HYDROXY (VIT D DEFICIENCY, FRACTURES): VITD: 29.32 ng/mL — ABNORMAL LOW (ref 30.00–100.00)

## 2016-11-24 LAB — FOLATE: Folate: 15.1 ng/mL (ref >5.9)

## 2016-11-24 LAB — C-REACTIVE PROTEIN: CRP: 1 mg/dL (ref 0.5–20.0)

## 2016-11-24 NOTE — Progress Notes (Signed)
[facility] Clinic Note - Initial Visit   Date: 11/24/16  Kelly Woodward MRN: [id number] DOB: 14-Sep-1960   Dear Dr. Elease Hashimoto:  Thank you for your kind referral of Kelly Woodward for consultation of paresthesias. Although her history is well known to you, please allow [place] to reiterate it for the purpose of our medical record. The patient was accompanied to the clinic by friend, Butch Penny, who also provides collateral information.     History of Present Illness: Kelly Woodward is a 56 y.o. righ-handed Caucasian female with GERD, former smoker, allergies, peptic ulcer, and anxiety presenting for evaluation of numbness and tingling of the hands and left leg as well as the constellation of symptoms.  She has a two-page list of symptoms that I have reviewed, which will be scanned into her medical chart.   She was visiting with her daughter in [place] in early summer and after returning from there in June, she began having spells of fatigue, left leg tingling, muscle twitches, blurred vision/jumpy vision, difficulty defecating, and hearing changes, described as hearing white noise.  These symptoms are not constant and come in spells which can lasts several weeks.  Symptoms are always worse with fatigue and improved with rest.  She has constant tingling of the left leg, which is worse with hot temperatures such as when in the shower. Over the past month, she has tingling of the right arm from the elbow into the fingers.  She also had 1-day spell of constant right index finger movement.  She has random areas of the scalp that tingles. She also describes difficulty coming up with words or substitutes words. She has to focus on daily tasks much more and tasks do not come naturally to her.  She works as a Civil engineer, contracting and stays very busy. On the weekends, she spends time in the bed all day. She feels depressed because of all these symptoms and endorses being worried and alone through  this.     In June 2017, she presented to the ER with transient left leg followed by and cheek numbness possibly a right brain subcortical TIA. She also complained of left foot drop and numbness which is more suggestive of peroneal neuropathy or lumbar radiculopathy. She was admitted for TIA work-up which included MRI/A brain, US carotids, and echo.  Because of GI irritation with aspirin in the past, plavix was recommended. She took this for a few weeks, however because symptoms remained persistent, she discontinued Plavix.   She was recently evaluated by her primary care provider for the symptoms who checked vitamin B12, TSH, and SPEP with IFE which was within normal limits.  Due to her ongoing symptoms, she was referred to neurology for further evaluation.  Out-side paper records, electronic medical record, and images have been reviewed where available and summarized as:  Lab Results  Component Value Date   TSH 1.04 09/15/2016   Lab Results  Component Value Date   VITAMINB12 285 09/15/2016   Lab Results  Component Value Date   HGBA1C 5.3 01/06/2016   MRI/A brain 01/05/2016:  Normal US carotids 01/06/2016:  1-39 percent stenosis involving the right internal carotid artery. No evidence of cerebrovascular disease involving the left common carotid artery. Surface Echo 01/06/2016:  No cardiac source of emboli was indentified.   Past Medical History:  Diagnosis Date  . Allergy    Rhinitis  . GERD (gastroesophageal reflux disease)   . History of nephrolithiasis 1996  . Osteopenia   .  Peptic ulcer    need to avoid ASA per pt.  . Tobacco abuse     Past Surgical History:  Procedure Laterality Date  . cyst on vocal chord  1998     Medications:  Outpatient Encounter Prescriptions as of 11/24/2016  Medication Sig  . Albuterol Sulfate (PROAIR RESPICLICK) 536 (90 Base) MCG/ACT AEPB Inhale into the lungs.  . calcium carbonate (TUMS - DOSED IN MG ELEMENTAL CALCIUM) 500 MG chewable tablet  Chew 1 tablet by mouth daily as needed for indigestion or heartburn.  . cetirizine (ZYRTEC) 10 MG tablet Take 10 mg by mouth daily.    Marland Kitchen estradiol-norethindrone (COMBIPATCH) 0.05-0.14 MG/DAY Place 1 patch onto the skin 2 (two) times a week.  Marland Kitchen LORazepam (ATIVAN) 0.5 MG tablet TAKE 1 TABLET BY MOUTH EVERY 8 HOURS AS NEEDED  . pantoprazole (PROTONIX) 40 MG tablet TAKE 1 TABLET (40 MG TOTAL) BY MOUTH DAILY. (Patient not taking: Reported on 11/24/2016)   No facility-administered encounter medications on file as of 11/24/2016.      Allergies:  Allergies  Allergen Reactions  . Astelin Other (See Comments)    nosebleeds    Family History: Family History  Problem Relation Age of Onset  . Melanoma Daughter   . Hypertension Mother   . Scoliosis Father   . Healthy Brother   . Cancer Maternal Grandmother   . Cancer Maternal Grandfather     Social History: Social History  Substance Use Topics  . Smoking status: Current Some Day Smoker  . Smokeless tobacco: Never Used  . Alcohol use No     Comment: rare   Social History   Social History Narrative   Lives with husband and daughters in a 2 story home.     Works as a Civil engineer, contracting (self-employed with personal business)   Education: BS    Review of Systems:  CONSTITUTIONAL: No fevers, chills, night sweats, or weight loss.   EYES: +visual changes or eye pain ENT: +hearing changes.  No history of nose bleeds.   RESPIRATORY: No cough, wheezing and shortness of breath.   CARDIOVASCULAR: Negative for chest pain, and palpitations.   GI: +for abdominal discomfort, blood in stools or black stools.  No recent change in bowel habits.   GU:  No history of incontinence.   MUSCLOSKELETAL: +history of joint pain or swelling.  No myalgias.   SKIN: Negative for lesions, rash, and itching.   HEMATOLOGY/ONCOLOGY: Negative for prolonged bleeding, bruising easily, and swollen nodes.  No history of cancer.   ENDOCRINE: Negative for cold or heat  intolerance, polydipsia or goiter.   PSYCH:  +depression +anxiety symptoms.   NEURO: As Above.   Vital Signs:  BP 130/80   Pulse 100   Ht _0  (1.753 m)   Wt 163 lb 4 oz (74 kg)   SpO2 99%   BMI 24.11 kg/m    General Medical Exam:   General:  Well appearing, comfortable, tearful and anxious at times. Eyes/ENT: see cranial nerve examination.   Neck: No masses appreciated.  Full range of motion without tenderness.  No carotid bruits. Respiratory:  Clear to auscultation, good air entry bilaterally.   Cardiac:  Regular rate and rhythm, no murmur.   Extremities:  No deformities, edema, or skin discoloration.  Skin:  No rashes or lesions.  Neurological Exam: MENTAL STATUS including orientation to time, place, person, recent and remote memory, attention span and concentration, language, and fund of knowledge is normal.  Speech is not dysarthric. She  has mildly pressured speech.  Montreal Cognitive Assessment  11/24/2016  Visuospatial/ Executive (0/5) 5  Naming (0/3) 3  Attention: Read list of digits (0/2) 2  Attention: Read list of letters (0/1) 1  Attention: Serial 7 subtraction starting at 100 (0/3) 3  Language: Repeat phrase (0/2) 2  Language : Fluency (0/1) 1  Abstraction (0/2) 2  Delayed Recall (0/5) 3  Orientation (0/6) 6  Total 28  Adjusted Score (based on education) 28   CRANIAL NERVES: II:  No visual field defects.  Unremarkable fundi.   III-IV-VI: Pupils equal round and reactive to light.  Normal conjugate, extra-ocular eye movements in all directions of gaze.  No nystagmus.  No ptosis.   V:  Normal facial sensation.   VII:  Normal facial symmetry and movements.  VIII:  Normal hearing and vestibular function.   IX-X:  Normal palatal movement.   XI:  Normal shoulder shrug and head rotation.   XII:  Normal tongue strength and range of motion, no deviation or fasciculation.  MOTOR:  No atrophy, fasciculations or abnormal movements.  No pronator drift.  Tone is normal.     Right Upper Extremity:    Left Upper Extremity:    Deltoid  5/5   Deltoid  5/5   Biceps  5/5   Biceps  5/5   Triceps  5/5   Triceps  5/5   Wrist extensors  5/5   Wrist extensors  5/5   Wrist flexors  5/5   Wrist flexors  5/5   Finger extensors  5/5   Finger extensors  5/5   Finger flexors  5/5   Finger flexors  5/5   Dorsal interossei  5/5   Dorsal interossei  5/5   Abductor pollicis  5/5   Abductor pollicis  5/5   Tone (Ashworth scale)  0  Tone (Ashworth scale)  0   Right Lower Extremity:    Left Lower Extremity:    Hip flexors  5/5   Hip flexors  5/5   Hip extensors  5/5   Hip extensors  5/5   Knee flexors  5/5   Knee flexors  5/5   Knee extensors  5/5   Knee extensors  5/5   Dorsiflexors  5/5   Dorsiflexors  5/5   Plantarflexors  5/5   Plantarflexors  5/5   Toe extensors  5/5   Toe extensors  5/5   Toe flexors  5/5   Toe flexors  5/5   Tone (Ashworth scale)  0  Tone (Ashworth scale)  0   MSRs:  Right                                                                 Left brachioradialis 2+  brachioradialis 2+  biceps 2+  biceps 2+  triceps 2+  triceps 2+  patellar 2+  patellar 2+  ankle jerk 2+  ankle jerk 2+  Hoffman no  Hoffman no  plantar response down  plantar response down   SENSORY:  Reduced perception of light touch, pin prick, and temperature over the left lateral lower leg and dorsum of the foot.  Otherwise, normal and perception of light touch, pinprick, vibration, and proprioception in the upper extremities and right leg.  Romberg's sign  absent.   COORDINATION/GAIT: Normal finger-to- nose-finger. Intact rapid alternating movements bilaterally.  Gait narrow based and stable. Tandem and stressed gait intact.    IMPRESSION: Kelly Woodward is a 56 year-old female referred for evaluation of paresthesias of the left leg and right arm but also complains of a constellation of other symptoms including  vision changes, muscle twitches, scalp paresthesias, hearing changes,  cognitive changes, as well as nonneurological symptoms including difficulty with defecation, skin changes, and chronic fatigue.  Her neurological exam is notable for subjective sensory loss over the left lateral lower leg, corresponding to the superficial peroneal nerve distribution. There is no associated weakness with peroneal innervated muscles. Otherwise, her neurological exam is normal and without evidence of upper motor neuron pathology. MRI/A brain was personally reviewed from June 2017, which is normal.  It was explained that given the number of symptoms, a unifying neurological diagnosis is unlikely especially with normal MRI from June 2017 which excludes the possibility of demyelinating disease. She is extremely worried about her symptoms, even tearful at times. To further evaluate, will perform electrodiagnostic testing of the right arm and left leg and repeat MRI brain with and without contrast. She does display a significant amount of anxiety, which may be contributing to her symptoms. Going forward, neurocognitive testing will be helpful to assess for any underlying mood disorder. Finally, she was also requesting lorazepam for insomnia and it was explained that I do not manage sleep conditions and she may want to see sleep specialist for further recommendations for this. She had many questions which I answered to the best of my ability.   PLAN/RECOMMENDATIONS:  1.  Check ESR, CRP, ANA, MMA, folate, copper, vitamin D 2.  NCS/EMG of the RIGHT arm and LEFT leg 3.  MRI brain wwo contrast 4.  Recommend neurocognitive testing going forward  Further recommendations will be based on the results of her testing.   The duration of this appointment visit was 60 minutes of face-to-face time with the patient.  Greater than 50% of this time was spent in counseling, explanation of diagnosis, planning of further management, and coordination of care.   Thank you for allowing me to participate in patient's  care.  If I can answer any additional questions, I would be pleased to do so.    Sincerely,    Jabree Rebert K. Posey Pronto, DO

## 2016-11-24 NOTE — Patient Instructions (Signed)
1.  Check labs 2.  NCS/EMG of the right arm and left leg 3.  MRI brain wwo contrast  We will call you with the results of your testing and decide the next step

## 2016-11-25 LAB — ANA: ANA: NEGATIVE

## 2016-11-25 LAB — SEDIMENTATION RATE: Sed Rate: 8 mm/hr (ref 0–30)

## 2016-11-26 ENCOUNTER — Encounter: Payer: Self-pay | Admitting: Family Medicine

## 2016-11-26 ENCOUNTER — Ambulatory Visit (INDEPENDENT_AMBULATORY_CARE_PROVIDER_SITE_OTHER): Payer: BLUE CROSS/BLUE SHIELD | Admitting: Family Medicine

## 2016-11-26 VITALS — BP 110/80 | HR 94 | Temp 98.5°F | Wt 167.3 lb

## 2016-11-26 DIAGNOSIS — R300 Dysuria: Secondary | ICD-10-CM

## 2016-11-26 DIAGNOSIS — F5104 Psychophysiologic insomnia: Secondary | ICD-10-CM

## 2016-11-26 DIAGNOSIS — R202 Paresthesia of skin: Secondary | ICD-10-CM | POA: Diagnosis not present

## 2016-11-26 LAB — POCT URINALYSIS DIPSTICK
Bilirubin, UA: NEGATIVE
Blood, UA: NEGATIVE
Glucose, UA: NEGATIVE
Ketones, UA: NEGATIVE
NITRITE UA: NEGATIVE
PROTEIN UA: NEGATIVE
SPEC GRAV UA: 1.02 (ref 1.010–1.025)
UROBILINOGEN UA: 0.2 U/dL
pH, UA: 6.5 (ref 5.0–8.0)

## 2016-11-26 LAB — COPPER, SERUM: Copper: 121 ug/dL (ref 70–175)

## 2016-11-26 LAB — METHYLMALONIC ACID, SERUM: Methylmalonic Acid, Quant: 224 nmol/L (ref 87–318)

## 2016-11-26 MED ORDER — TRAZODONE HCL 50 MG PO TABS: 50.0000 mg | ORAL_TABLET | Freq: Every evening | ORAL | 5 refills | Status: DC | PRN

## 2016-11-26 NOTE — Progress Notes (Signed)
Pre visit review using our clinic review tool, if applicable. No additional management support is needed unless otherwise documented below in the visit note. 

## 2016-11-26 NOTE — Progress Notes (Signed)
Subjective:     Patient ID: Kelly Woodward, female   DOB: 01/27/1961, 56 y.o.   MRN: [id number]  HPI Patient is here for multiple items as follows  She's had some recent mild burning with urination but no urine frequency. No flank pain. No suprapubic pain. No gross hematuria. She's had similar symptoms in the past with subsequent negative culture.  Patient has history of chronic insomnia. She has no difficulty falling asleep but early morning awakening. She tried multiple things in the past including Benadryl, Ambien, Flexeril without improvement. She currently uses lorazepam and would like to discuss other potential options that are non-controlled. She does not use any alcohol. Minimal caffeine use. Denies active depression.  Patient's had some recent intermittent paresthesias. She saw a neurologist and multiple studies ordered including further labs, MRI, EMG studies. Patient is basically requesting referral to another neurologist. She was not happy with her interaction with recent neurologist.  Past Medical History:  Diagnosis Date  . Allergy    Rhinitis  . GERD (gastroesophageal reflux disease)   . History of nephrolithiasis 1996  . Osteopenia   . Peptic ulcer    need to avoid ASA per pt.  . Tobacco abuse    Past Surgical History:  Procedure Laterality Date  . cyst on vocal chord  1998    reports that she quit smoking about 3 weeks ago. She has never used smokeless tobacco. She reports that she does not drink alcohol or use drugs. family history includes Cancer in her maternal grandfather and maternal grandmother; Healthy in her brother; Hypertension in her mother; Melanoma in her daughter; Scoliosis in her father. Allergies  Allergen Reactions  . Astelin Other (See Comments)    nosebleeds   .  Review of Systems  Constitutional: Positive for fatigue. Negative for appetite change, chills and fever.  Gastrointestinal: Negative for abdominal pain, constipation, diarrhea, nausea  and vomiting.  Genitourinary: Positive for dysuria and frequency.  Musculoskeletal: Negative for back pain.  Neurological: Negative for dizziness.       Objective:   Physical Exam  Constitutional: She appears well-developed and well-nourished.  Eyes: Pupils are equal, round, and reactive to light.  Neck: Neck supple. No JVD present. No thyromegaly present.  Cardiovascular: Normal rate and regular rhythm.  Exam reveals no gallop.   Pulmonary/Chest: Effort normal and breath sounds normal. No respiratory distress. She has no wheezes. She has no rales.  Musculoskeletal: She exhibits no edema.  Neurological: She is alert.       Assessment:     #1 dysuria. Urine dipstick reveals only trace leukocytes- otherwise negative  #2 chronic insomnia  #3 intermittent paresthesias    Plan:     -sleep hygiene discussed with handout given -Recommended trial of trazodone 50 mg daily at bedtime -Avoid late day use of caffeine -Urine culture sent. -She will get back with [place] as she is requesting possible referral to another neurologist. She will check with her insurance to see who is covered.  Eulas Post MD [facility] at [facility]

## 2016-11-26 NOTE — Patient Instructions (Signed)
Insomnia Insomnia is a sleep disorder that makes it difficult to fall asleep or to stay asleep. Insomnia can cause tiredness (fatigue), low energy, difficulty concentrating, mood swings, and poor performance at work or school. There are three different ways to classify insomnia:  Difficulty falling asleep.  Difficulty staying asleep.  Waking up too early in the morning. Any type of insomnia can be long-term (chronic) or short-term (acute). Both are common. Short-term insomnia usually lasts for three months or less. Chronic insomnia occurs at least three times a week for longer than three months. What are the causes? Insomnia may be caused by another condition, situation, or substance, such as:  Anxiety.  Certain medicines.  Gastroesophageal reflux disease (GERD) or other gastrointestinal conditions.  Asthma or other breathing conditions.  Restless legs syndrome, sleep apnea, or other sleep disorders.  Chronic pain.  Menopause. This may include hot flashes.  Stroke.  Abuse of alcohol, tobacco, or illegal drugs.  Depression.  Caffeine.  Neurological disorders, such as Alzheimer disease.  An overactive thyroid (hyperthyroidism). The cause of insomnia may not be known. What increases the risk? Risk factors for insomnia include:  Gender. Women are more commonly affected than men.  Age. Insomnia is more common as you get older.  Stress. This may involve your professional or personal life.  Income. Insomnia is more common in people with lower income.  Lack of exercise.  Irregular work schedule or night shifts.  Traveling between different time zones. What are the signs or symptoms? If you have insomnia, trouble falling asleep or trouble staying asleep is the main symptom. This may lead to other symptoms, such as:  Feeling fatigued.  Feeling nervous about going to sleep.  Not feeling rested in the morning.  Having trouble concentrating.  Feeling irritable,  anxious, or depressed. How is this treated? Treatment for insomnia depends on the cause. If your insomnia is caused by an underlying condition, treatment will focus on addressing the condition. Treatment may also include:  Medicines to help you sleep.  Counseling or therapy.  Lifestyle adjustments. Follow these instructions at home:  Take medicines only as directed by your health care provider.  Keep regular sleeping and waking hours. Avoid naps.  Keep a sleep diary to help you and your health care provider figure out what could be causing your insomnia. Include:  When you sleep.  When you wake up during the night.  How well you sleep.  How rested you feel the next day.  Any side effects of medicines you are taking.  What you eat and drink.  Make your bedroom a comfortable place where it is easy to fall asleep:  Put up shades or special blackout curtains to block light from outside.  Use a white noise machine to block noise.  Keep the temperature cool.  Exercise regularly as directed by your health care provider. Avoid exercising right before bedtime.  Use relaxation techniques to manage stress. Ask your health care provider to suggest some techniques that may work well for you. These may include:  Breathing exercises.  Routines to release muscle tension.  Visualizing peaceful scenes.  Cut back on alcohol, caffeinated beverages, and cigarettes, especially close to bedtime. These can disrupt your sleep.  Do not overeat or eat spicy foods right before bedtime. This can lead to digestive discomfort that can make it hard for you to sleep.  Limit screen use before bedtime. This includes:  Watching TV.  Using your smartphone, tablet, and computer.  Stick to a   routine. This can help you fall asleep faster. Try to do a quiet activity, brush your teeth, and go to bed at the same time each night.  Get out of bed if you are still awake after 15 minutes of trying to  sleep. Keep the lights down, but try reading or doing a quiet activity. When you feel sleepy, go back to bed.  Make sure that you drive carefully. Avoid driving if you feel very sleepy.  Keep all follow-up appointments as directed by your health care provider. This is important. Contact a health care provider if:  You are tired throughout the day or have trouble in your daily routine due to sleepiness.  You continue to have sleep problems or your sleep problems get worse. Get help right away if:  You have serious thoughts about hurting yourself or someone else. This information is not intended to replace advice given to you by your health care provider. Make sure you discuss any questions you have with your health care provider. Document Released: 07/09/2000 Document Revised: 12/12/2015 Document Reviewed: 04/12/2014 Elsevier Interactive Patient Education  2017 [employer]  

## 2016-11-28 DIAGNOSIS — F5104 Psychophysiologic insomnia: Secondary | ICD-10-CM | POA: Insufficient documentation

## 2016-11-29 ENCOUNTER — Telehealth: Payer: Self-pay | Admitting: *Deleted

## 2016-11-29 ENCOUNTER — Telehealth: Payer: Self-pay | Admitting: Neurology

## 2016-11-29 LAB — URINE CULTURE

## 2016-11-29 MED ORDER — AMOXICILLIN 500 MG PO CAPS: 500.0000 mg | ORAL_CAPSULE | Freq: Three times a day (TID) | ORAL | 0 refills | Status: AC

## 2016-11-29 NOTE — Telephone Encounter (Signed)
Left message for patient to call me back. 

## 2016-11-29 NOTE — Telephone Encounter (Signed)
Patient called back.  Gave results and instructions.

## 2016-11-29 NOTE — Telephone Encounter (Signed)
-----   Message from Alda Berthold, DO sent at 11/29/2016  9:19 AM EDT ----- Please inform pt her labs are within normal limits, except vitamin D is borderline low. Start vitamin D 800 units daily.

## 2016-11-29 NOTE — Telephone Encounter (Signed)
Gave patient results and instructions.   

## 2016-11-29 NOTE — Telephone Encounter (Signed)
Patient is returning your call. Please call back at [phone]. Thanks

## 2016-11-29 NOTE — Telephone Encounter (Signed)
Patient states that she has spoken to a neurologist and they request that she have a spinal MRI before seeing them.

## 2016-11-29 NOTE — Telephone Encounter (Signed)
FYI - the patient had spoken to a friend, who is a Garment/textile technologist at [employer].  She would like to see Dr Lynnette Caffey in [place].  She will call her insurance and see if he is in network.

## 2016-11-29 NOTE — Telephone Encounter (Signed)
Which neurology group is she considering?  Because she has many symptoms, I feel it would best for them to assess her first.

## 2016-11-29 NOTE — Telephone Encounter (Signed)
-----   Message from Alda Berthold, DO sent at 11/29/2016  9:19 AM EDT ----- Please inform Kelly Woodward her labs are within normal limits, except vitamin D is borderline low. Start vitamin D 800 units daily.

## 2016-12-10 ENCOUNTER — Telehealth: Payer: Self-pay | Admitting: Family Medicine

## 2016-12-10 ENCOUNTER — Encounter (HOSPITAL_COMMUNITY): Payer: Self-pay

## 2016-12-10 ENCOUNTER — Emergency Department (HOSPITAL_COMMUNITY)
Admission: EM | Admit: 2016-12-10 | Discharge: 2016-12-10 | Disposition: A | Discharge status: Home / Self Care | Payer: BLUE CROSS/BLUE SHIELD | Attending: Emergency Medicine | Admitting: Emergency Medicine

## 2016-12-10 DIAGNOSIS — R531 Weakness: Secondary | ICD-10-CM | POA: Diagnosis not present

## 2016-12-10 DIAGNOSIS — R202 Paresthesia of skin: Secondary | ICD-10-CM | POA: Insufficient documentation

## 2016-12-10 DIAGNOSIS — Z79899 Other long term (current) drug therapy: Secondary | ICD-10-CM | POA: Insufficient documentation

## 2016-12-10 DIAGNOSIS — Z87891 Personal history of nicotine dependence: Secondary | ICD-10-CM | POA: Insufficient documentation

## 2016-12-10 NOTE — ED Triage Notes (Addendum)
Per Pt, Pt is coming from home with complaints of left leg weakness, right leg numbness in the foot, right arm tingling, and left eye tingling that was present when she woke up this morning. Pt reports hx of this for the past eleventh months. Pt saw a neurologist that reported no abnormalities per exam.   Pt reports numbness and tingling coming and going for the past eleven months, but the weakness in the left leg is new. Pt reports eyes "moving quickly," but denies blurred vision.

## 2016-12-10 NOTE — Discharge Instructions (Signed)
Continue current meds.   See neurologist as scheduled. Consider working up for multiple sclerosis.   Return to ER if you have worse numbness, weakness, blurry vision, trouble walking

## 2016-12-10 NOTE — Telephone Encounter (Signed)
° ° °  Pt went to the ER and call to say the ER doctor told her to call and ask Dr Elease Hashimoto to refer her to [facility] Neurologist for a MS work up

## 2016-12-10 NOTE — Telephone Encounter (Signed)
I left a message for the pt to return my call. 

## 2016-12-10 NOTE — Telephone Encounter (Signed)
See prior phone note. 

## 2016-12-10 NOTE — Telephone Encounter (Signed)
She had already seen neurologist with [facility] and pt was requesting possible referral to [facility] for a second opinion.  OK to refer if that has not already been done.

## 2016-12-10 NOTE — ED Provider Notes (Signed)
[facility] DEPT Provider Note   CSN: [id number] Arrival date & time: 12/10/16  8921     History   Chief Complaint Chief Complaint  Patient presents with  . Weakness    HPI Kelly Woodward is a 56 y.o. female history of reflux, previous TIA, raynaud's here presenting with weakness, numbness, blurry vision. Patient states that her symptoms has been waxing and waning over the last 11 months or so. When it first started, she was admitted to the hospital and had a MRI and MRA brain that was unremarkable. Patient states that over the last Several months, often has left leg numbness that comes and goes. Also tingling of bilateral hands that is worse on the right side. In February, her symptoms got much worse and she even described electricity feeling going down her spine. At that time, she saw her primary care doctor and was referred to see a neurologist. About a week ago, patient saw Dr. Posey Pronto from [facility] neurology. The MRI from last year was reviewed and the neurologist felt that "it is all in her head". She states that she was very concerned and wants a second opinion. Over the last week or so, she has worsening left foot numbness and some more subjective weakness. Denies any trouble squeaking, states that the right hand numbness is about the same as previous. Denies any blurry vision currently.   The history is provided by the patient.    Past Medical History:  Diagnosis Date  . Allergy    Rhinitis  . GERD (gastroesophageal reflux disease)   . History of nephrolithiasis 1996  . Osteopenia   . Peptic ulcer    need to avoid ASA per pt.  . Tobacco abuse     Patient Active Problem List   Diagnosis Date Noted  . Chronic insomnia 11/28/2016  . TIA (transient ischemic attack) 01/05/2016  . Hypokalemia 01/05/2016  . GERD (gastroesophageal reflux disease)   . Gastroesophageal reflux disease without esophagitis   . Tobacco abuse   . Anxiety 09/16/2010  . Shoulder pain 09/16/2010  .  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 Seasonal and perennial allergic rhinitis 01/09/2007  . HELLP SYNDROME 01/09/2007  . PRE-EC/ECLAMPSIA ON PRE-X HTN, ANTEPARTUM 01/09/2007  . NEPHROLITHIASIS, HX OF 01/09/2007    Past Surgical History:  Procedure Laterality Date  . cyst on vocal chord  1998    OB History    Gravida Para Term Preterm AB Living   8         2   SAB TAB Ectopic Multiple Live Births                   Home Medications    Prior to Admission medications   Medication Sig Start Date End Date Taking? Authorizing Provider  Albuterol Sulfate (PROAIR RESPICLICK) 194 (90 Base) MCG/ACT AEPB Inhale into the lungs.    [provider]  calcium carbonate (TUMS - DOSED IN MG ELEMENTAL CALCIUM) 500 MG chewable tablet Chew 1 tablet by mouth daily as needed for indigestion or heartburn.    [provider]  cetirizine (ZYRTEC) 10 MG tablet Take 10 mg by mouth daily.      [provider]  estradiol-norethindrone [facility]) 0.05-0.14 MG/DAY Place 1 patch onto the skin 2 (two) times a week.    [provider]  LORazepam (ATIVAN) 0.5 MG tablet TAKE 1 TABLET BY MOUTH EVERY 8 HOURS AS NEEDED 11/10/16   Burchette, Alinda Sierras, MD  traZODone (DESYREL) 50 MG tablet Take  1 tablet (50 mg total) by mouth at bedtime as needed for sleep. 11/26/16   Burchette, Alinda Sierras, MD    Family History Family History  Problem Relation Age of Onset  . Melanoma Daughter   . Hypertension Mother   . Scoliosis Father   . Healthy Brother   . Cancer Maternal Grandmother   . Cancer Maternal Grandfather     Social History Social History  Substance Use Topics  . Smoking status: Former Smoker    Quit date: 11/05/2016  . Smokeless tobacco: Never Used  . Alcohol use No     Comment: rare     Allergies   Astelin   Review of Systems Review of Systems  Neurological: Positive for weakness and numbness.  All other systems reviewed and are negative.    Physical Exam Updated  Vital Signs BP (!) 150/96   Pulse 87   Temp 98.9 F (37.2 C) (Oral)   Resp 16   Ht 5\' 9"  (1.753 m)   Wt 168 lb (76.2 kg)   SpO2 100%   BMI 24.81 kg/m   Physical Exam  Constitutional: She is oriented to person, place, and time. She appears well-developed.  HENT:  Head: Normocephalic.  Mouth/Throat: Oropharynx is clear and moist.  Eyes: EOM are normal. Pupils are equal, round, and reactive to light.  Neck: Normal range of motion. Neck supple.  Cardiovascular: Normal rate, regular rhythm and normal heart sounds.   Pulmonary/Chest: Effort normal and breath sounds normal. No respiratory distress. She has no wheezes.  Abdominal: Soft. Bowel sounds are normal. She exhibits no distension. There is no tenderness. There is no guarding.  Musculoskeletal: Normal range of motion.  Neurological: She is alert and oriented to person, place, and time.  CN 2-12 intact. No facial droop. Strength 4/5 L plantar flexion (she states it is chronically weak), otherwise strength 5/5 throughout. Able to ambulate. Nl finger to nose. Slightly dec sensation R 4th and 5 th finger (chronic) and lateral aspect L ankle   Skin: Skin is warm.  Psychiatric: She has a normal mood and affect.  Nursing note and vitals reviewed.    ED Treatments / Results  Labs (all labs ordered are listed, but only abnormal results are displayed) Labs Reviewed - No data to display  EKG  EKG Interpretation None       Radiology No results found.  Procedures Procedures (including critical care time)  Medications Ordered in ED Medications - No data to display   Initial Impression / Assessment and Plan / ED Course  I have reviewed the triage vital signs and the nursing notes.  Pertinent labs & imaging results that were available during my care of the patient were reviewed by me and considered in my medical decision making (see chart for details).    Kelly Woodward is a 56 y.o. female here with weakness, numbness,  blurry vision. Symptoms waxing and waning over the last 11 months. She is still ambulating by herself and is not debilitated by it. I think it is reasonable to consider workup for multiple sclerosis in this setting. I think in her case, it is appropriate to get the workup done outpatient. I had extensive discussion with her regarding getting emergent MRI brain and spine for this, especially the cost and time spent in the ED. She was able to call her PCP and [facility] neurology while in the ED and was able to arrange follow up with Dr. Juliette Mangle from [place] next week. I think  she is stable for discharge currently and can get workup outpatient.    Final Clinical Impressions(s) / ED Diagnoses   Final diagnoses:  Weakness  Paresthesia    New Prescriptions Discharge Medication List as of 12/10/2016 10:37 AM       Drenda Freeze, MD 12/10/16 1053

## 2016-12-10 NOTE — Telephone Encounter (Signed)
Pt calling stating that she is in the hospital and need a referral to Dr. Felecia Shelling at [facility] already have a appt on Thursday 5/24 at 9:00 am

## 2016-12-13 NOTE — Telephone Encounter (Signed)
Patient called back and stated while at the ER on Friday she made her appt with a different neurologist due to the physician at [facility] told her "it was all in her head".  She is aware the referral was entered with Dr Garth Bigness name per the pts request.

## 2016-12-14 ENCOUNTER — Encounter: Payer: BLUE CROSS/BLUE SHIELD | Admitting: Neurology

## 2016-12-16 ENCOUNTER — Ambulatory Visit (INDEPENDENT_AMBULATORY_CARE_PROVIDER_SITE_OTHER): Payer: BLUE CROSS/BLUE SHIELD | Admitting: Neurology

## 2016-12-16 ENCOUNTER — Telehealth: Payer: Self-pay | Admitting: Neurology

## 2016-12-16 ENCOUNTER — Encounter: Payer: Self-pay | Admitting: Neurology

## 2016-12-16 VITALS — BP 149/85 | HR 98 | Ht 69.0 in | Wt 163.5 lb

## 2016-12-16 DIAGNOSIS — R682 Dry mouth, unspecified: Secondary | ICD-10-CM | POA: Insufficient documentation

## 2016-12-16 DIAGNOSIS — H539 Unspecified visual disturbance: Secondary | ICD-10-CM | POA: Diagnosis not present

## 2016-12-16 DIAGNOSIS — F5104 Psychophysiologic insomnia: Secondary | ICD-10-CM | POA: Diagnosis not present

## 2016-12-16 DIAGNOSIS — R200 Anesthesia of skin: Secondary | ICD-10-CM | POA: Diagnosis not present

## 2016-12-16 DIAGNOSIS — R3911 Hesitancy of micturition: Secondary | ICD-10-CM | POA: Insufficient documentation

## 2016-12-16 DIAGNOSIS — I7300 Raynaud's syndrome without gangrene: Secondary | ICD-10-CM

## 2016-12-16 DIAGNOSIS — G5620 Lesion of ulnar nerve, unspecified upper limb: Secondary | ICD-10-CM | POA: Insufficient documentation

## 2016-12-16 NOTE — Progress Notes (Signed)
[facility]  PATIENT: Kelly Woodward DOB: Mar 16, 1961  REFERRING DOCTOR OR PCP:  Dr. Lester [place] SOURCE: patient, notes from PCP/Dr. Posey Pronto and ED, imaging and lab reports, MRI images on PACS  _________________________________   HISTORICAL  CHIEF COMPLAINT:  Chief Complaint  Patient presents with  . Numbness    Kelly Woodward is here for eval of intermittent numbness/tingling left leg, onset 11 mos. ago.  Sts. last Friday morning she awoke with increased weakness in left leg/foot, was seen in the ER on that day for same.  One episode of "feeling of electricity" right arm in Feb. and since then has had some tingling right lower arm.  Generalized intermittent fatigue/fim  . Fatigue    HISTORY OF PRESENT ILLNESS:  I had the pleasure seeing you patient, Kelly Woodward, at [facility] for neurologic consultation regarding her intermittent numbness and tingling.  About 11 months ago,  she had numbness and weakness in her left leg and she was admitted overnight.  Of note, she had just returned forma long trip (from [place]).   A few days later, she had more tingling in the left leg and fatigue, especially in the leg.   These symptoms would be intermittent.    Sometimes her hand also tingled.    For a few months, she ha no symptoms unless she was in the shower, when she would have tingling in the left leg.  I personally reviewed the MRI images of the brain and the MR angiogram dated 01/05/2016. Both are normal.    She continues to have some intermittent tingling over the rest of the summer and then had a month or so where she felt better.  In [place] and October, she also noted some visual blurring and had some tingling in her left leg.    She had different symptoms later 2017 into January with a burning sensation in her legs, left greater than right.      In early February she noted confusion.  As an example she saw a vegetable and couldn't come up with [facility] name.    She felt her brain wasn't working fast enough and she often came up with the wrong word.   Also in February, she felt an electric shock from her head to her toes on her right side.   Since then she has tingling going from her elbow to the right 4th and 5th fingers.     In March and April 2018, she was doing better with tingling in the left leg, only when in hot water.    The arm was not really bothering her much at that time.     Last week, she felt weak, left more than right.   She felt her right eye was jerking when she looked around and she note blurry vision.  The face around her eye felt strange..   Also since then, she has noted urinary hesitancy and difficulty emptying.      She also noted some diffiuculty evacuating for bowel movements.      She saw Dr. Posey Pronto 11/24/2016.    Office notes from that visit were reviewed. She was set up to have an MRI of the brain and NCV/EMG. Notes and tests from 12/10/2016 emergency room visit were reviewed.  Currently, she is noting intermittent symptoms of the left leg and right arm.   she denies neck pain.   The right arm feels slightly weak if she does activity for a while -- like writing.  Hr legs also seem to fatigue more easily when she walks longer.      She has had  Raynaud type hand changes in cold temperatures starting about 10 years.     Otherwise, she has been healthy.    She sleeps poorly since menopause and takes Ativan at night at times (2-3 times a week).   She feels she has restless sleep and sometimes has strange dreams.   She has minimal restless leg syndrome but it is muchworse when she takes Benadryl.    Ativan helps the RLS as well as her insomnia.     REVIEW OF SYSTEMS: Constitutional: No fevers, chills, sweats, or change in appetite.   She has insomnia and some fatigue Eyes: No visual changes, double vision, eye pain Ear, nose and throat: No hearing loss, ear pain, nasal congestion, sore throat.   She notes dry  mouth Cardiovascular: No chest pain, palpitations Respiratory: No shortness of breath at rest or with exertion.   No wheezes GastrointestinaI: No nausea, vomiting, diarrhea, abdominal pain, fecal incontinence Genitourinary: No dysuria, urinary retention or frequency.  No nocturia. Musculoskeletal: No neck pain, back pain Integumentary: No rash, pruritus, skin lesions Neurological: as above Psychiatric: No depression at this time.  Notes anxiety Endocrine: No palpitations, diaphoresis, change in appetite, change in weigh or increased thirst Hematologic/Lymphatic: No anemia, purpura, petechiae. Allergic/Immunologic: No itchy/runny eyes, nasal congestion, recent allergic reactions, rashes  ALLERGIES: Allergies  Allergen Reactions  . Astelin Other (See Comments)    nosebleeds    HOME MEDICATIONS:  Current Outpatient Prescriptions:  .  Albuterol Sulfate (PROAIR RESPICLICK) 242 (90 Base) MCG/ACT AEPB, Inhale into the lungs., Disp: , Rfl:  .  calcium carbonate (TUMS - DOSED IN MG ELEMENTAL CALCIUM) 500 MG chewable tablet, Chew 1 tablet by mouth daily as needed for indigestion or heartburn., Disp: , Rfl:  .  cetirizine (ZYRTEC) 10 MG tablet, Take 10 mg by mouth daily.  , Disp: , Rfl:  .  estradiol-norethindrone (COMBIPATCH) 0.05-0.14 MG/DAY, Place 1 patch onto the skin 2 (two) times a week., Disp: , Rfl:  .  LORazepam (ATIVAN) 0.5 MG tablet, TAKE 1 TABLET BY MOUTH EVERY 8 HOURS AS NEEDED, Disp: 20 tablet, Rfl: 0 .  traZODone (DESYREL) 50 MG tablet, Take 1 tablet (50 mg total) by mouth at bedtime as needed for sleep. (Patient not taking: Reported on 12/16/2016), Disp: 30 tablet, Rfl: 5  PAST MEDICAL HISTORY: Past Medical History:  Diagnosis Date  . Allergy    Rhinitis  . GERD (gastroesophageal reflux disease)   . History of nephrolithiasis 1996  . Osteopenia   . Peptic ulcer    need to avoid ASA per pt.  . Tobacco abuse   . Vision abnormalities     PAST SURGICAL HISTORY: Past  Surgical History:  Procedure Laterality Date  . cyst on vocal chord  1998    FAMILY HISTORY: Family History  Problem Relation Age of Onset  . Melanoma Daughter   . Hypertension Mother   . Scoliosis Father   . Healthy Brother   . Cancer Maternal Grandmother   . Cancer Maternal Grandfather     SOCIAL HISTORY:  Social History   Social History  . Marital status: Married    Spouse name: N/A  . Number of children: 2  . Years of education: 16   Occupational History  . music therapist    Social History Main Topics  . Smoking status: Former Smoker    Quit date: 11/05/2016  .  Smokeless tobacco: Never Used  . Alcohol use No     Comment: rare  . Drug use: No  . Sexual activity: Not on file   Other Topics Concern  . Not on file   Social History Narrative   Lives with husband and daughters in a 2 story home.     Works as a Civil engineer, contracting (self-employed with personal business)   Education: BS     PHYSICAL EXAM  Vitals:   12/16/16 0847  BP: (!) 149/85  Pulse: 98  Weight: 163 lb 8 oz (74.2 kg)  Height: 5\' 9"  (1.753 m)    Body mass index is 24.14 kg/m.   General: The patient is well-developed and well-nourished and in no acute distress  Eyes:  Funduscopic exam shows normal optic discs and retinal vessels.  Neck: The neck is supple, no carotid bruits are noted.  The neck is nontender.  Cardiovascular: The heart has a regular rate and rhythm with a normal S1 and S2. There were no murmurs, gallops or rubs. Lungs are clear to auscultation.  Skin: Extremities are without significant edema.  Musculoskeletal:  Back is nontender  Neurologic Exam  Mental status: The patient is alert and oriented x 3 at the time of the examination. The patient has apparent normal recent and remote memory, with an apparently normal attention span and concentration ability.   Speech is normal.  Cranial nerves: Extraocular movements are full. Pupils are equal, round, and reactive to  light and accomodation.  Visual fields are full.  Facial symmetry is present. There is good facial sensation to soft touch bilaterally.Facial strength is normal.  Trapezius and sternocleidomastoid strength is normal. No dysarthria is noted.  The tongue is midline, and the patient has symmetric elevation of the soft palate. No obvious hearing deficits are noted.  Motor:  Muscle bulk is normal.   Tone is normal. Strength is 4/5 in the ulnar innervated hand muscles on the right, 4+/5 in the median innervated hand muscles on the right and right triceps, 4++/5 median hand on the left  5 / 5 elsewhere.   Sensory: Sensory testing shows mildly reduced sensation over the hyperthenar eminence on the right. Sensation to touch, vibration and temperature was normal elsewhere in the arms.  There was reduced vibration sensation in the left foot and slightly reduced vibration sensation at the left knee..  Coordination: Cerebellar testing reveals good finger-nose-finger and heel-to-shin bilaterally.  Gait and station: Station is normal.   Gait is normal. Tandem gait is normal. Romberg is negative.   Reflexes: Deep tendon reflexes are symmetric and normal bilaterally except mildly reduced right triceps jerk.  Knee reflexes mildly increased but no spread.   No ankle clonus. .   Plantar responses are flexor.  Tinel's:  She had Tinel's signs at the right elbow and, to a lesser extent, at the right wrist    DIAGNOSTIC DATA (LABS, IMAGING, TESTING) - I reviewed patient records, labs, notes, testing and imaging myself where available.  Lab Results  Component Value Date   WBC 8.7 01/05/2016   HGB 12.7 01/05/2016   HCT 37.7 01/05/2016   MCV 92.0 01/05/2016   PLT 353 01/05/2016      Component Value Date/Time   NA 139 09/15/2016 1413   K 3.9 09/15/2016 1413   CL 105 09/15/2016 1413   CO2 28 09/15/2016 1413   GLUCOSE 91 09/15/2016 1413   BUN 19 09/15/2016 1413   CREATININE 0.67 09/15/2016 1413   CALCIUM 9.3  09/15/2016 1413   PROT 7.4 01/05/2016 1550   ALBUMIN 4.4 01/05/2016 1550   AST 19 01/05/2016 1550   ALT 13 (L) 01/05/2016 1550   ALKPHOS 65 01/05/2016 1550   BILITOT 0.8 01/05/2016 1550   GFRNONAA >60 01/05/2016 1550   GFRAA >60 01/05/2016 1550   Lab Results  Component Value Date   CHOL 152 01/06/2016   HDL 42 01/06/2016   LDLCALC 94 01/06/2016   TRIG 78 01/06/2016   CHOLHDL 3.6 01/06/2016   Lab Results  Component Value Date   HGBA1C 5.3 01/06/2016   Lab Results  Component Value Date   VITAMINB12 285 09/15/2016   Lab Results  Component Value Date   TSH 1.04 09/15/2016       ASSESSMENT AND PLAN  Numbness - Plan: ANA w/Reflex, Sedimentation rate, Cryoglobulin, Pan-ANCA, Hepatitis C antibody, MR BRAIN W WO CONTRAST, MR CERVICAL SPINE WO CONTRAST, NCV with EMG(electromyography)  Visual changes - Plan: MR BRAIN W WO CONTRAST  Raynaud's disease without gangrene - Plan: ANA w/Reflex, Sedimentation rate, Cryoglobulin, Pan-ANCA, Hepatitis C antibody  Chronic insomnia  Urinary hesitancy - Plan: MR CERVICAL SPINE WO CONTRAST  Ulnar neuropathy, unspecified laterality - Plan: NCV with EMG(electromyography)  Dry mouth - Plan: ANA w/Reflex, Sedimentation rate   In summary, Kelly Woodward is a 56 year old woman with paresthesias predominantly in the left leg and right arm who also has noted some other transient neurologic symptoms including visual disturbance and word finding difficulties. She would like to transfer her neurologic care to our office.   She has a history of Raynauds syndrome and also reports dry mouth.     On exam, she has weakness in the ulnar innervated muscles of the right hand and a Tinel sign at the elbow consistent with an old on neuropathy. She also has mild APB weakness on the right and could have a mild carpal tunnel syndrome.. There was some weakness in the C7 innervated triceps muscle as well as a reduced right triceps reflex. We need to check an MRI of  the cervical spine to make sure that there is not a herniated disc causing radiculopathy and possible myelopathy could explain some of her other symptoms going on in the legs.    Additionally, due to her paresthesias, and other neurologic symptoms we will check an MRI of the brain to make sure that there are no lesions that would be worrisome for demyelination or ischemic changes that could explain her symptoms.  She will return to see [place] for her NCV/EMG study grade her own on neuropathy and determine if there are other superimposed neuropathies or radiculopathies and I will see her that day.  Thank you for asking [place] to see Mrs. Cornia. Please let me know if I can be of further assistance with her or other patients in the future.   Aquilla Shambley A. Felecia Shelling, MD, PhD 09/13/7586, 3:25 AM Certified in Neurology, Clinical Neurophysiology, Sleep Medicine, Pain Medicine and Neuroimaging  [facility] [address] [place], [place] [address] [phone]

## 2016-12-16 NOTE — Telephone Encounter (Signed)
Unable to get mri brain approved until Dr. Ena Dawley office withdraws their order for MRI brain. Please call Dr. Serita Grit office and explain that they need to cancel/withdraw their MRI brain order so that we can get our's approved.  Or pt. can have mri that Dr. Posey Pronto ordered and bring CD to us./fim

## 2016-12-16 NOTE — Telephone Encounter (Signed)
I started the authorization with [employer] for a mri brain w/wo & cervical spine wo contrast.. They did start the case put it did not get approved because she already had a mri brain w/wo contrast by Dr. Serita Grit office that is good until 12/28/16.. And since I wasn't sure if she had it or not they would not approve it. And also since I am not apart of Dr. Serita Grit office I could not withdraw that mri request... The phone number for the peer to peer is [phone] and the member ID is [id number] & DOB is 02/26/1961.. They also didn't give a time frame when this case close's. How would you like to proceed with this?

## 2016-12-16 NOTE — Telephone Encounter (Signed)
I called the patient and informed her since the MRI brain was approved by Dr. Posey Pronto office and it has not expired yet  to go ahead and have it under her name and then have the cervical under Dr. Felecia Shelling name.. I told her I would send the order to [facility] imaging that way Dr. Felecia Shelling would be able to see both.. Patient understood.. I called [employer] and withdrew the MRI Brain and got the approval for the cervical.

## 2016-12-20 LAB — PAN-ANCA
ANCA Proteinase 3: <3.5 U/mL (ref 0.0–3.5)
Atypical pANCA: <1:20 {titer}
C-ANCA: <1:20 {titer}
Myeloperoxidase Ab: <9 U/mL (ref 0.0–9.0)
P-ANCA: <1:20 {titer}

## 2016-12-20 LAB — ANA W/REFLEX: Anti Nuclear Antibody(ANA): NEGATIVE

## 2016-12-20 LAB — CRYOGLOBULIN

## 2016-12-20 LAB — SEDIMENTATION RATE: Sed Rate: 8 mm/h (ref 0–40)

## 2016-12-20 LAB — HEPATITIS C ANTIBODY

## 2016-12-21 NOTE — Telephone Encounter (Signed)
Referral was placed on 12/13/16

## 2016-12-22 ENCOUNTER — Telehealth: Payer: Self-pay | Admitting: *Deleted

## 2016-12-22 NOTE — Telephone Encounter (Signed)
-----   Message from Britt Bottom, MD sent at 12/21/2016  6:45 PM EDT ----- Please let the patient know that the lab work is fine.

## 2016-12-22 NOTE — Telephone Encounter (Signed)
LMOM (identifed vm) that per RAS, lab work done in our office is fine.  She does not need to return this call unless she has questions/fim

## 2016-12-27 ENCOUNTER — Telehealth: Payer: Self-pay | Admitting: Neurology

## 2016-12-27 NOTE — Telephone Encounter (Signed)
Caller: Dorian Pod   Urgent? Yes  Reason for the call: [place] is scheduled for an MRI on Thursday and Dorian Pod was calling to follow up on the PA since already started. I did tell her to contact GI for PA. Please call Dorian Pod. Thanks

## 2016-12-27 NOTE — Telephone Encounter (Signed)
Please call Dorian Pod.

## 2016-12-29 ENCOUNTER — Telehealth: Payer: Self-pay | Admitting: Neurology

## 2016-12-29 DIAGNOSIS — H539 Unspecified visual disturbance: Secondary | ICD-10-CM

## 2016-12-29 DIAGNOSIS — R202 Paresthesia of skin: Secondary | ICD-10-CM

## 2016-12-29 DIAGNOSIS — R4789 Other speech disturbances: Secondary | ICD-10-CM

## 2016-12-29 NOTE — Telephone Encounter (Signed)
Noted, thank you I did get it approved and I called Dorian Pod at [facility] to inform her of this.

## 2016-12-29 NOTE — Telephone Encounter (Signed)
Can you put a new MR Brain w/wo contrast contrast order in. The one that Dr. Posey Pronto used the patient stated it has expired and she does not want to use Dr. Posey Pronto order she wants Dr. Felecia Shelling to put a new order in and use his.. She is scheduled to have the cervical spine done tomorrow at [place] and wants to have the Brain done at the same time.Kelly Woodward

## 2016-12-29 NOTE — Telephone Encounter (Signed)
MRI reordered (last order placed by different physician and pa for that study has expired)/fim

## 2016-12-30 ENCOUNTER — Other Ambulatory Visit: Payer: BLUE CROSS/BLUE SHIELD

## 2016-12-30 ENCOUNTER — Ambulatory Visit
Admission: RE | Admit: 2016-12-30 | Discharge: 2016-12-30 | Disposition: A | Discharge status: Home / Self Care | Payer: BLUE CROSS/BLUE SHIELD | Source: Ambulatory Visit | Attending: Neurology | Admitting: Neurology

## 2016-12-30 DIAGNOSIS — M50221 Other cervical disc displacement at C4-C5 level: Secondary | ICD-10-CM | POA: Diagnosis not present

## 2016-12-30 DIAGNOSIS — R3911 Hesitancy of micturition: Secondary | ICD-10-CM

## 2016-12-30 DIAGNOSIS — M50222 Other cervical disc displacement at C5-C6 level: Secondary | ICD-10-CM | POA: Diagnosis not present

## 2016-12-30 DIAGNOSIS — R200 Anesthesia of skin: Secondary | ICD-10-CM

## 2016-12-31 ENCOUNTER — Telehealth: Payer: Self-pay | Admitting: *Deleted

## 2016-12-31 NOTE — Telephone Encounter (Signed)
LMOM (identified vm) and per RAS, left message with MRI results as below. She does not need to return this call unless she has questions/fim

## 2016-12-31 NOTE — Telephone Encounter (Signed)
-----   Message from Britt Bottom, MD sent at 12/30/2016  6:02 PM EDT ----- Please let her know that she does have disc protrusions at C5-C6 and C6-C7 that could be contributing to her pain I will see her when she comes in later this month for her NCV/emg

## 2017-01-03 ENCOUNTER — Telehealth: Payer: Self-pay | Admitting: Neurology

## 2017-01-03 NOTE — Telephone Encounter (Signed)
I spoke with Kelly Woodward and informed her to just schedule the Brain because she had the Cervical done on 12/30/16. She stated she will call the patient and schedule the Brain.

## 2017-01-03 NOTE — Telephone Encounter (Signed)
Sara/GI [phone] needs to know if doing MRI cervical spine with MRI brain and if so it needs to be put back in

## 2017-01-17 ENCOUNTER — Telehealth: Payer: Self-pay | Admitting: Neurology

## 2017-01-17 NOTE — Telephone Encounter (Signed)
LMOM (identified vm) that pain being intermittent would not affect NCV/EMG results.  She does not need to return this call unless she has further questions/fim

## 2017-01-17 NOTE — Telephone Encounter (Signed)
Pt calling re: NCV testing on 6-26 pt said for over a year the pain is off and on and now not bothering her.  Pt would like a call back to know if this will impact results of testing

## 2017-01-18 ENCOUNTER — Ambulatory Visit (INDEPENDENT_AMBULATORY_CARE_PROVIDER_SITE_OTHER): Payer: BLUE CROSS/BLUE SHIELD | Admitting: Neurology

## 2017-01-18 ENCOUNTER — Ambulatory Visit (INDEPENDENT_AMBULATORY_CARE_PROVIDER_SITE_OTHER): Payer: Self-pay | Admitting: Neurology

## 2017-01-18 DIAGNOSIS — R200 Anesthesia of skin: Secondary | ICD-10-CM | POA: Diagnosis not present

## 2017-01-18 DIAGNOSIS — Z0289 Encounter for other administrative examinations: Secondary | ICD-10-CM

## 2017-01-18 DIAGNOSIS — M5412 Radiculopathy, cervical region: Secondary | ICD-10-CM | POA: Insufficient documentation

## 2017-01-18 DIAGNOSIS — G5620 Lesion of ulnar nerve, unspecified upper limb: Secondary | ICD-10-CM

## 2017-01-18 NOTE — Progress Notes (Signed)
Full Name: Kelly Woodward Gender: Female MRN #: [id number] Date of Birth: [birth year]    Visit Date: 01/18/2017 13:39 Age: 56 Years 0 Months Old Examining Physician: Felecia Shelling, MD Referring Physician: Felecia Shelling, MD    History: Kelly Woodward is a 56 year old woman who has had episodes of weakness and numbness in the right arm and the left leg. Currently, strength and sensation is normal in the right arm but she had triceps weakness at the last office visit. Strength is normal in the legs.  Nerve conduction studies: The right median motor response had normal distal latency, amplitudes and conduction velocity. The right ulnar motor response had normal distal latencies and normal conduction velocities in the forearm and around the elbow. However, amplitudes were mildly reduced. Left peroneal and tibial motor responses had normal distal latencies, amplitudes and conduction velocities.   Ulnar and tibial F-wave responses were normal.  The right median sensory, right ulnar sensory, left sural sensory and left superficial peroneal sensory responses had normal peak latencies and amplitudes  Needle EMG: In the right arm, the deltoid, biceps, and first dorsal interosseous muscle had normal motor unit action potentials and recruitment. There were a few polyphasic motor units in the triceps, EDC and pronator teres and the triceps also showed mild neuropathic recruitment.  There was no abnormal spontaneous activity.  In the left leg, the vastus medialis, tibialis anterior and peroneus longus muscle had normal motor unit action potentials and recruitment. The gastrocnemius and first dorsal interosseous muscle of the foot showed increased polyphasic motor units with mild neuropathic recruitment. There was no abnormal spontaneous activity.  Impression: This nerve conduction and EMG study shows the following: 1.   No evidence of mononeuropathy or polyneuropathy. 2.   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   Richard A. Felecia Shelling, MD, PhD, FAAN Certified in Neurology, Clinical Neurophysiology, Sleep Medicine, Pain Medicine and Neuroimaging Director, [place] [facility] [address], [place], [place] [address] [phone]         [facility]    Nerve / Sites Rec. Site Latency Ref. Amplitude Ref. Rel Amp Segments Distance Velocity Ref. Area    ms ms mV mV %  cm m/s m/s mVms  R Median - APB     Wrist APB 3.4 ?4.4 7.1 ?4.0 100 Wrist - APB 7   29.4     Upper arm APB 7.3  6.9  96.6 Upper arm - Wrist 21 54 ?49 28.0  R Ulnar - ADM     Wrist ADM 3.1 ?3.3 4.8 ?6.0 100 Wrist - ADM 7   14.1     B.Elbow ADM 6.4  4.7  97.2 B.Elbow - Wrist 20 61 ?49 13.9     A.Elbow ADM 8.3  4.1  88.6 A.Elbow - B.Elbow 12 62 ?49 12.2         A.Elbow - Wrist      L Peroneal - EDB     Ankle EDB 4.0 ?6.5 5.1 ?2.0 100 Ankle - EDB 9   17.9     Fib head EDB 11.7  4.8  94.4 Fib head - Ankle 34 44 ?44 16.3     Pop fossa EDB 13.9  4.7  97.7 Pop fossa - Fib head 10 45 ?44 15.6         Pop fossa - Ankle      L Tibial - AH  Ankle AH 5.0 ?5.8 13.5 ?4.0 100 Ankle - AH 9   28.9     Pop fossa AH 14.7  6.9  51.5 Pop fossa - Ankle 40 41 ?41 22.8             SNC    Nerve / Sites Rec. Site Peak Lat Ref.  Amp Ref. Segments Distance Peak Diff Ref.    ms ms V V  cm ms ms  L Sural - Ankle (Calf)     Calf Ankle 3.75 ?4.40 7 ?6 Calf - Ankle 14    L Superficial peroneal - Ankle     Lat leg Ankle 3.02 ?4.40 24 ?6 Lat leg - Ankle 14    R Median, Ulnar - Transcarpal comparison     Median Palm Wrist 1.98 ?2.20 56 ?35 Median Palm - Wrist 8       Ulnar Palm Wrist 1.98 ?2.20 34 ?12 Ulnar Palm - Wrist 8          Median Palm - Ulnar Palm  0.0 ?0.4  R Median - Orthodromic (Dig II, Mid palm)     Dig II Wrist 3.02 ?3.40 14 ?10 Dig II - Wrist 13    R Ulnar - Orthodromic, (Dig V, Mid palm)     Dig V Wrist 2.76 ?3.10  16 ?5 Dig V - Wrist 84                 F  Wave    Nerve F Lat Ref.   ms ms  R Ulnar - ADM 26.1 ?32.0  L Tibial - AH 51.7 ?56.0         EMG full       EMG Summary Table    Spontaneous MUAP Recruitment  Muscle IA Fib PSW Fasc Other Amp Dur. Poly Pattern  R. Deltoid Normal None None None _______ Normal Normal Normal Normal  R. Triceps brachii Normal None None None _______ Normal Normal 1+ Reduced  R. Biceps brachii Normal None None None _______ Normal Normal Normal Normal  R. Extensor dig communis Normal None None None _______ Normal Normal 1+ Normal  R. Pronator teres Normal None None None _______ Normal Normal 1+ Normal  R. First dorsal interosseous Normal None None None _______ Normal Normal Normal Normal  L. Vastus lateralis Normal None None None _______ Normal Normal Normal Normal  L. Tibialis anterior Normal None None None _______ Normal Normal Normal Normal  L. Peroneus longus Normal None None None _______ Normal Normal Normal Normal  L. Gastrocnemius (Medial head) Normal None None None _______ Normal Normal 1+ Reduced  L. First dorsal interosseous Normal None None None _______ Increased Increased 1+ Reduced

## 2017-01-20 ENCOUNTER — Ambulatory Visit
Admission: RE | Admit: 2017-01-20 | Discharge: 2017-01-20 | Disposition: A | Discharge status: Home / Self Care | Payer: BLUE CROSS/BLUE SHIELD | Source: Ambulatory Visit | Attending: Neurology | Admitting: Neurology

## 2017-01-20 DIAGNOSIS — R202 Paresthesia of skin: Secondary | ICD-10-CM

## 2017-01-20 DIAGNOSIS — H539 Unspecified visual disturbance: Secondary | ICD-10-CM

## 2017-01-20 DIAGNOSIS — R4789 Other speech disturbances: Secondary | ICD-10-CM | POA: Diagnosis not present

## 2017-01-20 DIAGNOSIS — H538 Other visual disturbances: Secondary | ICD-10-CM | POA: Diagnosis not present

## 2017-01-20 MED ORDER — GADOBENATE DIMEGLUMINE 529 MG/ML IV SOLN
15.0000 mL | Freq: Once | INTRAVENOUS | Status: AC | PRN
Start: 2017-01-20 — End: 2017-01-20
  Administered 2017-01-20: 15 mL via INTRAVENOUS

## 2017-01-23 DIAGNOSIS — L258 Unspecified contact dermatitis due to other agents: Secondary | ICD-10-CM | POA: Diagnosis not present

## 2017-01-25 ENCOUNTER — Telehealth: Payer: Self-pay | Admitting: Neurology

## 2017-01-25 NOTE — Telephone Encounter (Signed)
Noted.  Will call once RAS has read MRI/fim

## 2017-01-25 NOTE — Telephone Encounter (Signed)
LMOM that per RAS, MRI brain was normal for age; nothing acute.  The few age related changes were the same as they were on her 2017 MRI.  She does not need to return this call unless she has questions/fim

## 2017-01-25 NOTE — Telephone Encounter (Signed)
Pt request MRI results.  She is aware it can take 4 business days for results

## 2017-02-02 IMAGING — MR MR HEAD W/O CM
9 of 10 series · 38 of 48 positions shown · non-contrast
Comparison: CT head 01/05/2016

CLINICAL DATA: Left-sided weakness.  Visual changes.

EXAM:
MRI HEAD WITHOUT CONTRAST
TECHNIQUE: Multiplanar, multiecho pulse sequences of the brain and surrounding
structures were obtained without intravenous contrast.

[Series 3: DWI · axial · 3.0mm · 0.94mm/px · z∈[-79,+63]mm · 11 of 100 slices shown (1 of 2)]
[im 1/100]
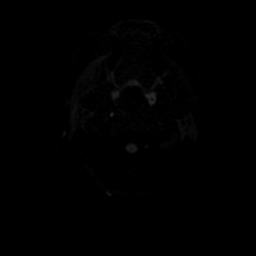
[im 10/100]
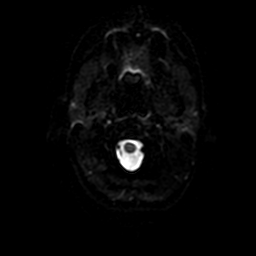
[im 20/100]
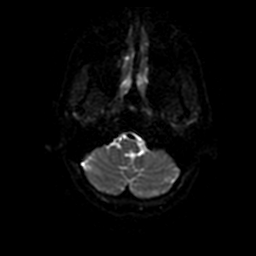
[im 30/100]
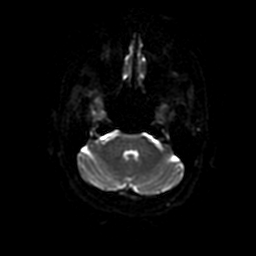
[im 40/100]
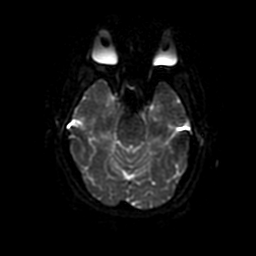
[im 50/100]
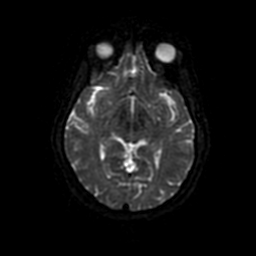
[im 60/100]
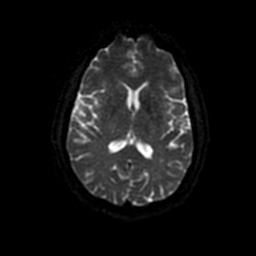
[im 70/100]
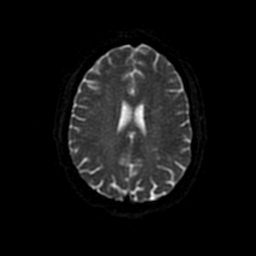
[im 80/100]
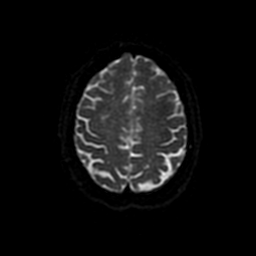
[im 90/100]
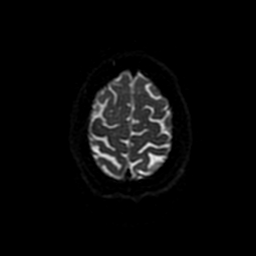
[im 100/100]
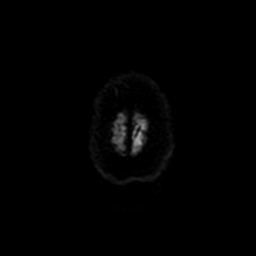

[Series 4: FLAIR · sagittal · 5.0mm · 0.47mm/px · 2 of 23 slices shown (1 of 2)]
[im 1/23]
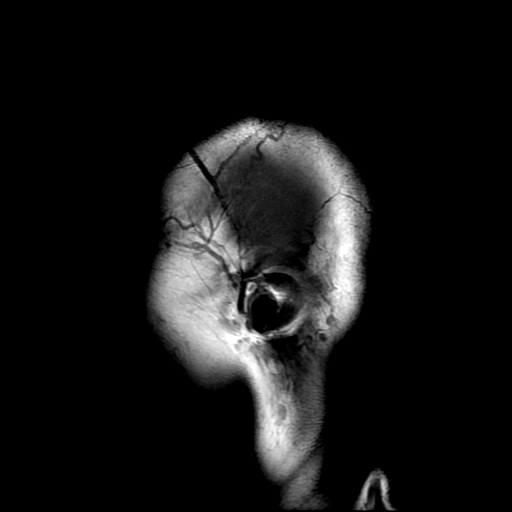
[im 23/23]
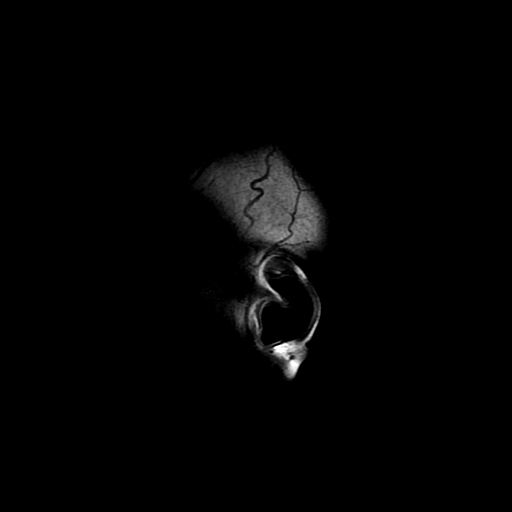

[Series 5: T2 · axial · 5.0mm · 0.47mm/px · z∈[-73,+62]mm · 2 of 24 slices shown (1 of 2)]
[im 1/24]
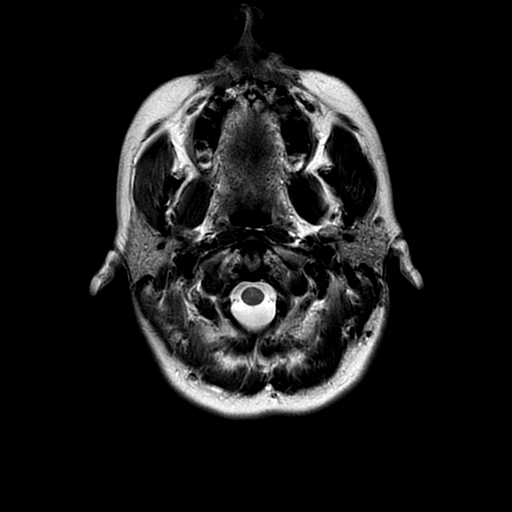
[im 24/24]
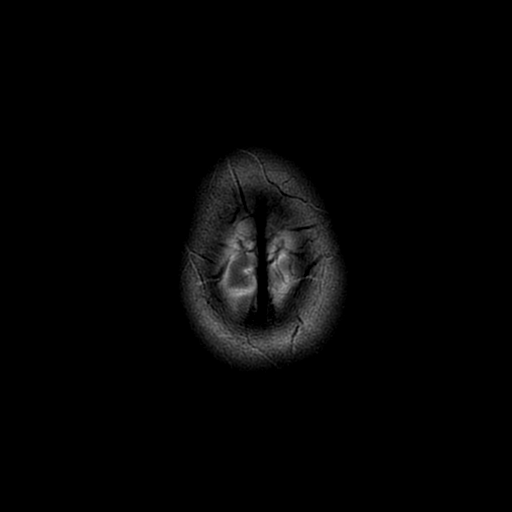

[Series 6: FLAIR · axial · 5.0mm · 0.47mm/px · z∈[-73,+62]mm · 2 of 24 slices shown (2 of 2)]
[im 1/24]
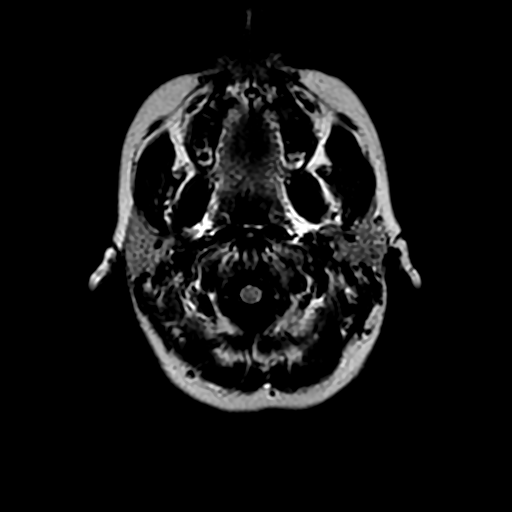
[im 24/24]
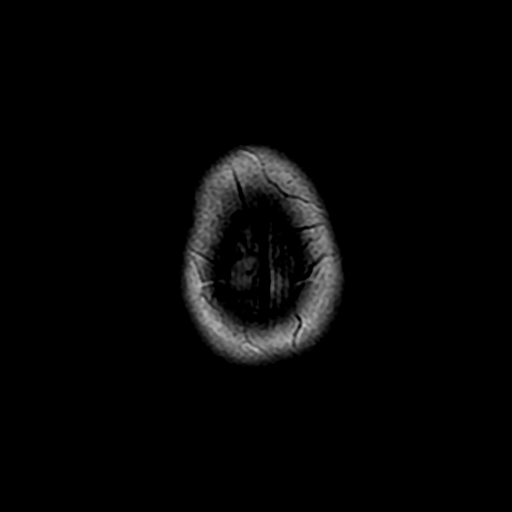

[Series 7: (person_name) · axial · 3.0mm · 0.47mm/px · z∈[-75,-41]mm · 3 of 96 slices shown]
[im 1/96]
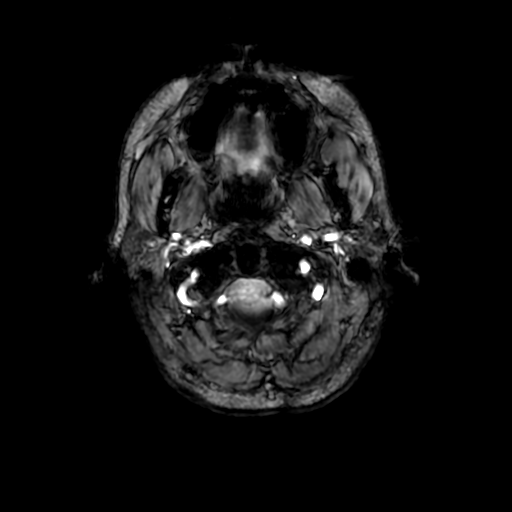
[im 12/96]
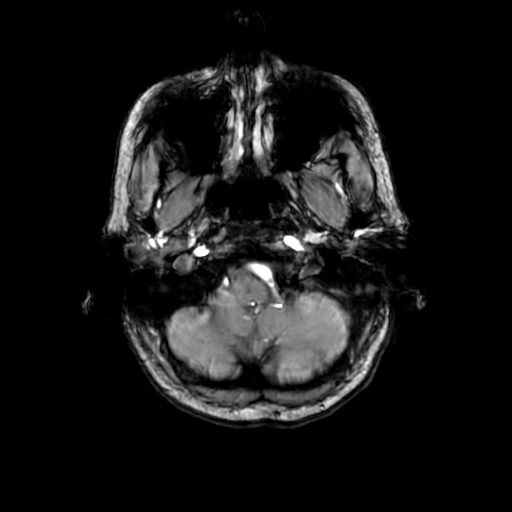
[im 24/96]
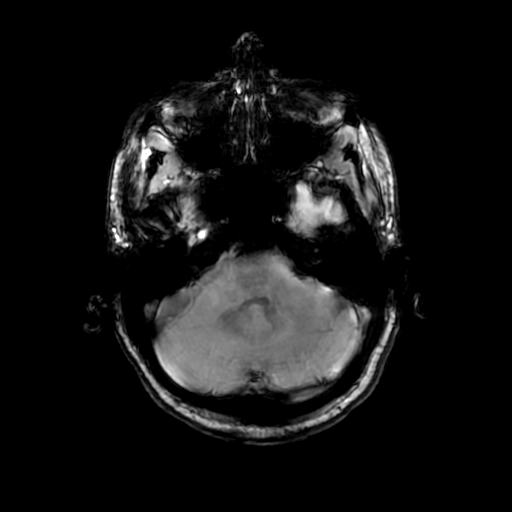

[Series 9: DWI · coronal · 4.0mm · 0.94mm/px · 7 of 72 slices shown (2 of 2)]
[im 1/72]
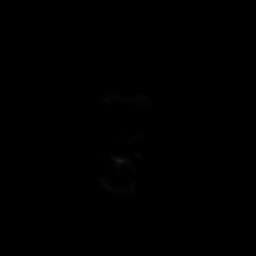
[im 12/72]
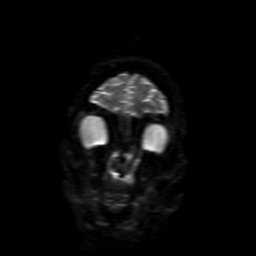
[im 24/72]
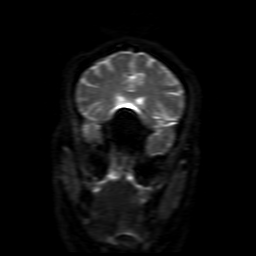
[im 36/72]
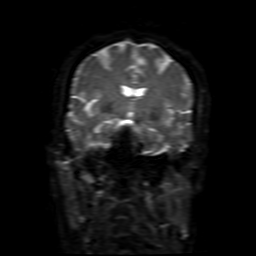
[im 48/72]
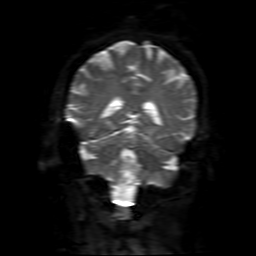
[im 60/72]
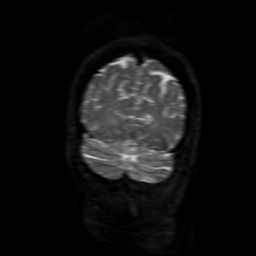
[im 72/72]
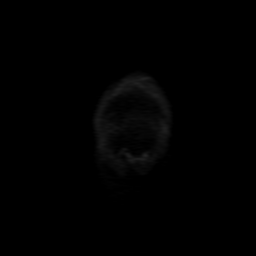

[Series 10: T2 · coronal · 5.0mm · 0.47mm/px · 3 of 30 slices shown (2 of 2)]
[im 1/30]
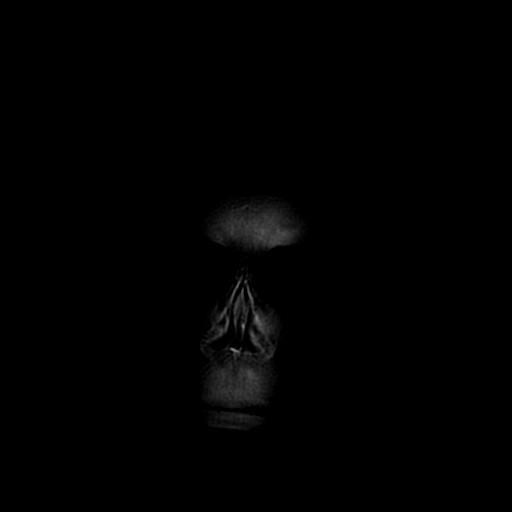
[im 15/30]
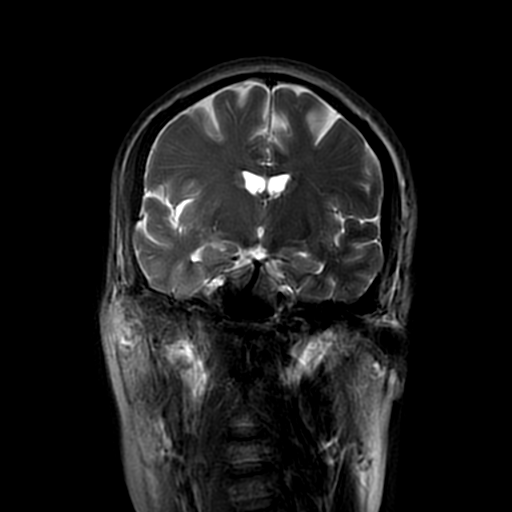
[im 30/30]
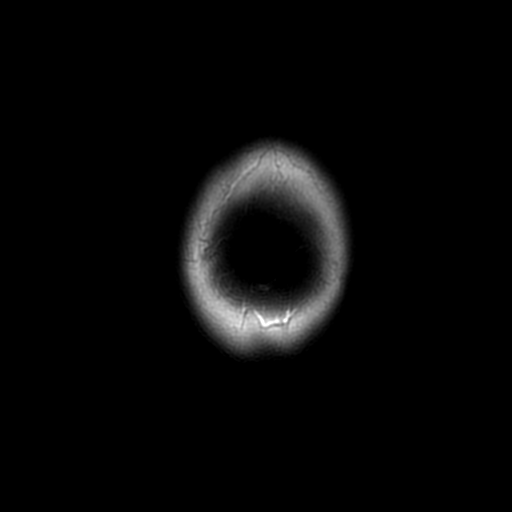

[Series 350: ADC · axial · 3.0mm · 0.94mm/px · z∈[-79,+63]mm · 5 of 50 slices shown (1 of 2)]
[im 1/50]
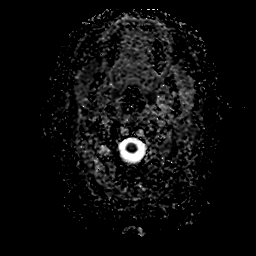
[im 13/50]
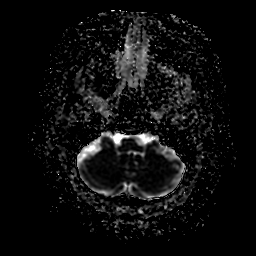
[im 25/50]
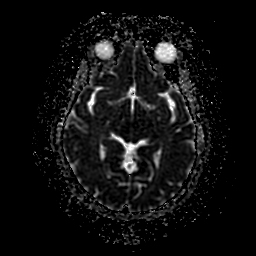
[im 37/50]
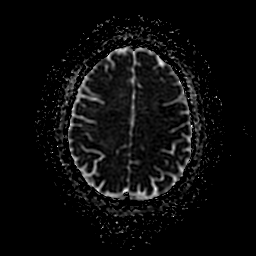
[im 50/50]
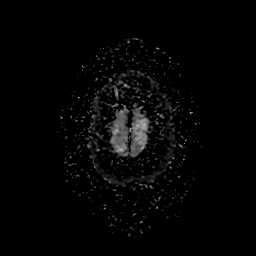

[Series 950: ADC · coronal · 4.0mm · 0.94mm/px · 3 of 36 slices shown (2 of 2)]
[im 1/36]
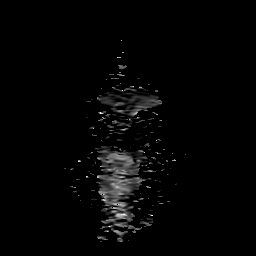
[im 18/36]
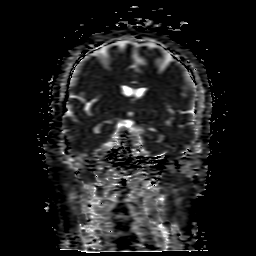
[im 36/36]
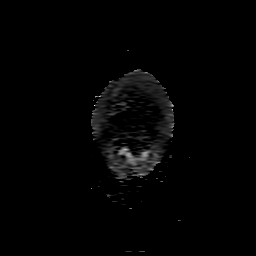

[38 of 48 positions shown; findings below may reference images not displayed]

FINDINGS: Ventricle size normal.  Cerebral volume normal.

Negative for acute or chronic infarction

Negative for demyelinating disease. Cerebral white matter normal.
Brainstem normal.

Negative for intracranial hemorrhage or fluid collection.

Negative for mass or edema.  No shift of the midline structures

Paranasal sinuses clear. Pituitary and skull base normal. Normal
orbits.
IMPRESSION: Normal MRI of the brain.

## 2017-04-14 ENCOUNTER — Encounter: Payer: Self-pay | Admitting: Family Medicine

## 2017-04-19 DIAGNOSIS — R87618 Other abnormal cytological findings on specimens from cervix uteri: Secondary | ICD-10-CM | POA: Diagnosis not present

## 2017-04-19 DIAGNOSIS — Z01419 Encounter for gynecological examination (general) (routine) without abnormal findings: Secondary | ICD-10-CM | POA: Diagnosis not present

## 2017-04-19 DIAGNOSIS — N898 Other specified noninflammatory disorders of vagina: Secondary | ICD-10-CM | POA: Diagnosis not present

## 2017-04-19 DIAGNOSIS — Z124 Encounter for screening for malignant neoplasm of cervix: Secondary | ICD-10-CM | POA: Diagnosis not present

## 2017-04-19 DIAGNOSIS — Z1231 Encounter for screening mammogram for malignant neoplasm of breast: Secondary | ICD-10-CM | POA: Diagnosis not present

## 2017-05-17 DIAGNOSIS — N898 Other specified noninflammatory disorders of vagina: Secondary | ICD-10-CM | POA: Diagnosis not present

## 2017-05-17 DIAGNOSIS — N63 Unspecified lump in unspecified breast: Secondary | ICD-10-CM | POA: Diagnosis not present

## 2017-05-17 DIAGNOSIS — R928 Other abnormal and inconclusive findings on diagnostic imaging of breast: Secondary | ICD-10-CM | POA: Diagnosis not present

## 2017-05-24 ENCOUNTER — Other Ambulatory Visit: Payer: Self-pay | Admitting: Radiology

## 2017-05-24 DIAGNOSIS — N6089 Other benign mammary dysplasias of unspecified breast: Secondary | ICD-10-CM | POA: Diagnosis not present

## 2017-05-24 DIAGNOSIS — N6012 Diffuse cystic mastopathy of left breast: Secondary | ICD-10-CM | POA: Diagnosis not present

## 2017-05-26 ENCOUNTER — Ambulatory Visit (INDEPENDENT_AMBULATORY_CARE_PROVIDER_SITE_OTHER): Payer: BLUE CROSS/BLUE SHIELD | Admitting: Internal Medicine

## 2017-05-26 ENCOUNTER — Encounter: Payer: Self-pay | Admitting: Internal Medicine

## 2017-05-26 VITALS — BP 128/68 | HR 91 | Temp 98.4°F | Ht 69.0 in | Wt 167.8 lb

## 2017-05-26 DIAGNOSIS — R350 Frequency of micturition: Secondary | ICD-10-CM

## 2017-05-26 DIAGNOSIS — R300 Dysuria: Secondary | ICD-10-CM | POA: Diagnosis not present

## 2017-05-26 LAB — POC URINALSYSI DIPSTICK (AUTOMATED)
Bilirubin, UA: NEGATIVE
GLUCOSE UA: NEGATIVE
Ketones, UA: NEGATIVE
Leukocytes, UA: NEGATIVE
NITRITE UA: NEGATIVE
Protein, UA: NEGATIVE
RBC UA: NEGATIVE
Spec Grav, UA: 1.015 (ref 1.010–1.025)
UROBILINOGEN UA: 0.2 U/dL
pH, UA: 7.5 (ref 5.0–8.0)

## 2017-05-26 MED ORDER — LORAZEPAM 0.5 MG PO TABS: 0.5000 mg | ORAL_TABLET | Freq: Three times a day (TID) | ORAL | 0 refills | Status: DC | PRN

## 2017-05-26 NOTE — Progress Notes (Signed)
Subjective:    Patient ID: Kelly Woodward, female    DOB: [birth year], 56 y.o.   MRN: [id number]  HPI  56 -year-old patient who presents with a three-day history of burning dysuria, urinary frequency associated with some nocturia and urgency.  No fever, chills, flank pain.  She states this has been treated recently with metronidazole for bacterial vaginosis.  She also states she generally has occasional UTIs once every year or 2  Past Medical History:  Diagnosis Date  . Allergy    Rhinitis  . GERD (gastroesophageal reflux disease)   . History of nephrolithiasis 1996  . Osteopenia   . Peptic ulcer    need to avoid ASA per pt.  . Tobacco abuse   . Vision abnormalities      Social History   Social History  . Marital status: Married    Spouse name: N/A  . Number of children: 2  . Years of education: 16   Occupational History  . music therapist    Social History Main Topics  . Smoking status: Former Smoker    Quit date: 11/05/2016  . Smokeless tobacco: Never Used  . Alcohol use No     Comment: rare  . Drug use: No  . Sexual activity: Not on file   Other Topics Concern  . Not on file   Social History Narrative   Lives with husband and daughters in a 2 story home.     Works as a Civil engineer, contracting (self-employed with personal business)   Education: BS    Past Surgical History:  Procedure Laterality Date  . 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 Melanoma Daughter   . Hypertension Mother   . Scoliosis Father   . Healthy Brother   . Cancer Maternal Grandmother   . Cancer Maternal Grandfather     Allergies  Allergen Reactions  . Astelin Other (See Comments)    nosebleeds    Current Outpatient Prescriptions on File Prior to Visit  Medication Sig Dispense Refill  . Albuterol Sulfate (PROAIR RESPICLICK) 063 (90 Base) MCG/ACT AEPB Inhale into the lungs.    . calcium carbonate (TUMS - DOSED IN MG ELEMENTAL CALCIUM) 500 MG  chewable tablet Chew 1 tablet by mouth daily as needed for indigestion or heartburn.    . cetirizine (ZYRTEC) 10 MG tablet Take 10 mg by mouth daily.      Marland Kitchen estradiol-norethindrone (COMBIPATCH) 0.05-0.14 MG/DAY Place 1 patch onto the skin 2 (two) times a week.    Marland Kitchen LORazepam (ATIVAN) 0.5 MG tablet TAKE 1 TABLET BY MOUTH EVERY 8 HOURS AS NEEDED 20 tablet 0   No current facility-administered medications on file prior to visit.     BP 128/68 (BP Location: Left Arm, Patient Position: Sitting, Cuff Size: Normal)   Pulse 91   Temp 98.4 F (36.9 C) (Oral)   Ht 5\' 9"  (1.753 m)   Wt 167 lb 12.8 oz (76.1 kg)   SpO2 99%   BMI 24.78 kg/m     Review of Systems  Constitutional: Negative.   HENT: Negative for congestion, dental problem, hearing loss, rhinorrhea, sinus pressure, sore throat and tinnitus.   Eyes: Negative for pain, discharge and visual disturbance.  Respiratory: Negative for cough and shortness of breath.   Cardiovascular: Negative for chest pain, palpitations and leg swelling.  Gastrointestinal: Negative for abdominal distention, abdominal pain, blood in stool, constipation, diarrhea, nausea and vomiting.  Genitourinary: Positive for difficulty urinating, dysuria and frequency. Negative for flank pain, hematuria, pelvic pain, urgency, vaginal bleeding, vaginal discharge and vaginal pain.  Musculoskeletal: Negative for arthralgias, gait problem and joint swelling.  Skin: Negative for rash.  Neurological: Negative for dizziness, syncope, speech difficulty, weakness, numbness and headaches.  Hematological: Negative for adenopathy.  Psychiatric/Behavioral: Positive for sleep disturbance. Negative for agitation, behavioral problems and dysphoric mood. The patient is not nervous/anxious.        Objective:   Physical Exam  Constitutional: She is oriented to person, place, and time. She appears well-developed and well-nourished.  Afebrile Blood pressure well controlled  HENT:    Head: Normocephalic.  Right Ear: External ear normal.  Left Ear: External ear normal.  Mouth/Throat: Oropharynx is clear and moist.  Eyes: Pupils are equal, round, and reactive to light. Conjunctivae and EOM are normal.  Neck: Normal range of motion. Neck supple. No thyromegaly present.  Cardiovascular: Normal rate, regular rhythm, normal heart sounds and intact distal pulses.   Pulmonary/Chest: Effort normal and breath sounds normal.  Abdominal: Soft. Bowel sounds are normal. She exhibits no mass. There is no tenderness.  No suprapubic tenderness  Musculoskeletal: Normal range of motion.  Lymphadenopathy:    She has no cervical adenopathy.  Neurological: She is alert and oriented to person, place, and time.  Skin: Skin is warm and dry. No rash noted.  Psychiatric: She has a normal mood and affect. Her behavior is normal.          Assessment & Plan:   Urinary frequency and dysuria.  Normal UA.  Will force fluids and observed for 24 hours.  If symptoms worsen.  Will call before the weekend. She does have a prescription for Diflucan prescribed by GYN, which she will consider taking for possible early yeast infection in the setting of recent antibiotic use  Nyoka Cowden

## 2017-05-26 NOTE — Patient Instructions (Signed)
Drink as much fluid as you  can tolerate over the next few days  Call or return to clinic prn if these symptoms worsen or fail to improve as anticipated.

## 2017-06-14 DIAGNOSIS — R300 Dysuria: Secondary | ICD-10-CM | POA: Diagnosis not present

## 2017-06-14 DIAGNOSIS — N760 Acute vaginitis: Secondary | ICD-10-CM | POA: Diagnosis not present

## 2017-07-14 DIAGNOSIS — R300 Dysuria: Secondary | ICD-10-CM | POA: Diagnosis not present

## 2017-11-22 DIAGNOSIS — N898 Other specified noninflammatory disorders of vagina: Secondary | ICD-10-CM | POA: Diagnosis not present

## 2017-11-22 DIAGNOSIS — R102 Pelvic and perineal pain: Secondary | ICD-10-CM | POA: Diagnosis not present

## 2017-11-24 ENCOUNTER — Other Ambulatory Visit (HOSPITAL_COMMUNITY): Payer: Self-pay | Admitting: Obstetrics and Gynecology

## 2017-11-24 ENCOUNTER — Other Ambulatory Visit: Payer: Self-pay | Admitting: Obstetrics and Gynecology

## 2017-11-24 DIAGNOSIS — R102 Pelvic and perineal pain: Secondary | ICD-10-CM

## 2017-11-25 ENCOUNTER — Other Ambulatory Visit (HOSPITAL_COMMUNITY): Payer: Self-pay | Admitting: Obstetrics and Gynecology

## 2017-11-25 ENCOUNTER — Ambulatory Visit (HOSPITAL_COMMUNITY)
Admission: RE | Admit: 2017-11-25 | Discharge: 2017-11-25 | Disposition: A | Discharge status: Home / Self Care | Payer: BLUE CROSS/BLUE SHIELD | Source: Ambulatory Visit | Attending: Obstetrics and Gynecology | Admitting: Obstetrics and Gynecology

## 2017-11-25 DIAGNOSIS — N83519 Torsion of ovary and ovarian pedicle, unspecified side: Secondary | ICD-10-CM | POA: Diagnosis not present

## 2017-11-25 DIAGNOSIS — D259 Leiomyoma of uterus, unspecified: Secondary | ICD-10-CM | POA: Insufficient documentation

## 2017-12-13 ENCOUNTER — Other Ambulatory Visit: Payer: Self-pay | Admitting: Obstetrics and Gynecology

## 2017-12-13 DIAGNOSIS — N950 Postmenopausal bleeding: Secondary | ICD-10-CM | POA: Diagnosis not present

## 2017-12-13 DIAGNOSIS — N898 Other specified noninflammatory disorders of vagina: Secondary | ICD-10-CM | POA: Diagnosis not present

## 2017-12-13 DIAGNOSIS — Z113 Encounter for screening for infections with a predominantly sexual mode of transmission: Secondary | ICD-10-CM | POA: Diagnosis not present

## 2017-12-16 DIAGNOSIS — T63481A Toxic effect of venom of other arthropod, accidental (unintentional), initial encounter: Secondary | ICD-10-CM | POA: Diagnosis not present

## 2017-12-16 DIAGNOSIS — Z189 Retained foreign body fragments, unspecified material: Secondary | ICD-10-CM | POA: Diagnosis not present

## 2018-02-22 ENCOUNTER — Encounter: Payer: Self-pay | Admitting: Family Medicine

## 2018-02-22 ENCOUNTER — Ambulatory Visit (INDEPENDENT_AMBULATORY_CARE_PROVIDER_SITE_OTHER): Payer: BLUE CROSS/BLUE SHIELD | Admitting: Family Medicine

## 2018-02-22 VITALS — BP 130/80 | HR 98 | Temp 99.1°F | Wt 173.5 lb

## 2018-02-22 DIAGNOSIS — J0100 Acute maxillary sinusitis, unspecified: Secondary | ICD-10-CM

## 2018-02-22 DIAGNOSIS — L03031 Cellulitis of right toe: Secondary | ICD-10-CM | POA: Diagnosis not present

## 2018-02-22 MED ORDER — AMOXICILLIN-POT CLAVULANATE 875-125 MG PO TABS: 1.0000 | ORAL_TABLET | Freq: Two times a day (BID) | ORAL | 0 refills | Status: DC

## 2018-02-22 MED ORDER — LORAZEPAM 0.5 MG PO TABS: 0.5000 mg | ORAL_TABLET | Freq: Every evening | ORAL | 0 refills | Status: DC | PRN

## 2018-02-22 NOTE — Patient Instructions (Signed)

## 2018-02-22 NOTE — Progress Notes (Signed)
  Subjective:     Patient ID: Kelly Woodward, female   DOB: Jun 27, 1961, 57 y.o.   MRN: [id number]  HPI Patient developed cold-like symptoms over week ago. She's had worsening symptoms since then. She has now pain right maxillary sinus and some greenish nasal discharge. She's tried Afrin, Sudafed, and Aleve without much relief. Intermittent sore throat and headache. No sick contacts. Rare cough.  Second issue is some mild redness right second toe. She had pedicure last fall was had some intermittent problems since then with brittle nail changes. She noticed redness couple days ago. Minimal soreness.  She is getting ready to go to the beach later this week to celebrate her anniversary.  Past Medical History:  Diagnosis Date  . Allergy    Rhinitis  . GERD (gastroesophageal reflux disease)   . History of nephrolithiasis 1996  . Osteopenia   . Peptic ulcer    need to avoid ASA per pt.  . Tobacco abuse   . Vision abnormalities    Past Surgical History:  Procedure Laterality Date  . cyst on vocal chord  1998    reports that she quit smoking about 15 months ago. She has never used smokeless tobacco. She reports that she does not drink alcohol or use drugs. family history includes Cancer in her maternal grandfather and maternal grandmother; Healthy in her brother; Hypertension in her mother; Melanoma in her daughter; Scoliosis in her father. Allergies  Allergen Reactions  . Astelin Other (See Comments)    nosebleeds     Review of Systems  Constitutional: Positive for fatigue. Negative for chills and fever.  HENT: Positive for congestion, sinus pressure, sinus pain and sore throat.   Respiratory: Positive for cough. Negative for shortness of breath.        Objective:   Physical Exam  Constitutional: She appears well-developed and well-nourished.  HENT:  Right Ear: External ear normal.  Left Ear: External ear normal.  Mouth/Throat: Oropharynx is clear and moist.  Neck: Neck  supple.  Cardiovascular: Normal rate and regular rhythm.  Pulmonary/Chest: Effort normal and breath sounds normal. She has no wheezes. She has no rales.  Lymphadenopathy:    She has no cervical adenopathy.  Skin:  Very mild erythema right second toe dorsally around the base of the nail. No visible purulence. No open wounds. Minimally tender to palpation. She has dysmorphic nail with some increased thickening       Assessment:     #1 acute right maxillary sinusitis probably following viral URI  #2 early mild cellulitis right second toe. Probable onychomycosis of the toenail    Plan:     -Augmentin 875 mg twice daily for 10 days -Warm soaks to foot 2-3 times daily -Follow-up for any progressive redness, swelling, or other concerns. -We briefly discuss potential treatment of her toenail predicament (Lamisil) and she would like to wait at this time.  Eulas Post MD [facility] at [facility]

## 2018-04-03 ENCOUNTER — Telehealth: Payer: Self-pay | Admitting: Family Medicine

## 2018-04-03 ENCOUNTER — Encounter (HOSPITAL_COMMUNITY): Payer: Self-pay | Admitting: Emergency Medicine

## 2018-04-03 ENCOUNTER — Emergency Department (HOSPITAL_COMMUNITY)
Admission: EM | Admit: 2018-04-03 | Discharge: 2018-04-03 | Disposition: A | Discharge status: Home / Self Care | Payer: BLUE CROSS/BLUE SHIELD | Attending: Emergency Medicine | Admitting: Emergency Medicine

## 2018-04-03 ENCOUNTER — Other Ambulatory Visit: Payer: Self-pay

## 2018-04-03 DIAGNOSIS — Z79899 Other long term (current) drug therapy: Secondary | ICD-10-CM | POA: Insufficient documentation

## 2018-04-03 DIAGNOSIS — R21 Rash and other nonspecific skin eruption: Secondary | ICD-10-CM | POA: Diagnosis not present

## 2018-04-03 DIAGNOSIS — T887XXA Unspecified adverse effect of drug or medicament, initial encounter: Secondary | ICD-10-CM | POA: Insufficient documentation

## 2018-04-03 DIAGNOSIS — Z8673 Personal history of transient ischemic attack (TIA), and cerebral infarction without residual deficits: Secondary | ICD-10-CM | POA: Diagnosis not present

## 2018-04-03 DIAGNOSIS — T360X5A Adverse effect of penicillins, initial encounter: Secondary | ICD-10-CM | POA: Insufficient documentation

## 2018-04-03 DIAGNOSIS — Y658 Other specified misadventures during surgical and medical care: Secondary | ICD-10-CM | POA: Insufficient documentation

## 2018-04-03 DIAGNOSIS — T50905A Adverse effect of unspecified drugs, medicaments and biological substances, initial encounter: Secondary | ICD-10-CM

## 2018-04-03 MED ORDER — METHYLPREDNISOLONE 4 MG PO TBPK: ORAL_TABLET | ORAL | 0 refills | Status: DC

## 2018-04-03 MED ORDER — FAMOTIDINE 20 MG PO TABS
20.0000 mg | ORAL_TABLET | Freq: Once | ORAL | Status: AC
  Administered 2018-04-03: 20 mg via ORAL
  Filled 2018-04-03: qty 1

## 2018-04-03 MED ORDER — METHYLPREDNISOLONE SODIUM SUCC 125 MG IJ SOLR
125.0000 mg | Freq: Once | INTRAMUSCULAR | Status: AC
  Administered 2018-04-03: 125 mg via INTRAMUSCULAR
  Filled 2018-04-03: qty 2

## 2018-04-03 MED ORDER — DIPHENHYDRAMINE HCL 25 MG PO CAPS
25.0000 mg | ORAL_CAPSULE | Freq: Once | ORAL | Status: AC
  Administered 2018-04-03: 25 mg via ORAL
  Filled 2018-04-03: qty 1

## 2018-04-03 NOTE — ED Triage Notes (Signed)
Pt reports taking 1000mg  of Amoxicillin today for a possible tooth infection. First day of antibiotics and pt reports tinging in hands and redness all over body that started at 4pm. Pt reports voice changes and like her soft palate is swollen. Pt denies sob, denies pain. Pt has never reacted to amoxicillin before and has not taken anything at home for reaction.

## 2018-04-03 NOTE — Telephone Encounter (Signed)
Pt has been checked in to [facility] ED.

## 2018-04-03 NOTE — ED Notes (Addendum)
Note made in error

## 2018-04-03 NOTE — ED Provider Notes (Signed)
MSE was initiated and I personally evaluated the patient and placed orders (if any) at  4:55 PM on April 03, 2018.  The patient appears stable so that the remainder of the MSE may be completed by another provider.  Patient placed in Quick Look pathway, seen and evaluated   Chief Complaint:allergic reaction  HPI:   Patient presents the emergency with complaint to.  She had onset of itchy palms followed by generalized hives and flushing.  She denies vomiting, syncope.  She has a previous history of allergic reaction to amoxicillin but has had a past.  She is complaining of some sensation of swelling in her palate but no change in voice breath.  ROS: Rash Physical Exam:   Gen: No distress  Neuro: Awake and Alert  Skin: Warm generalized erythematous confluent macular non-blanchable rash over the face arms torso.  Over the legs there are areas of singular macules present to beginning to coalesce.     Focused Exam: Patient with sudden onset reaction to amoxicillin.  Concern for allergic reaction versus there is a potential for Stevens-Johnson syndrome given the mucosal involvement of her throat.  We will continue to monitor the patient.  She is gotten IM Solu-Medrol, oral Benadryl and Pepcid. Initiation of care has begun. The patient has been counseled on the process, plan, and necessity for staying for the completion/evaluation, and the remainder of the medical screening examination    Margarita Mail, PA-C 04/03/18 [place], [place], DO 04/03/18 2311

## 2018-04-03 NOTE — ED Provider Notes (Signed)
[facility] EMERGENCY DEPARTMENT Provider Note   CSN: [id number] Arrival date & time: 04/03/18  1624     History   Chief Complaint Chief Complaint  Patient presents with  . Allergic Reaction    HPI Kelly Woodward is a 57 y.o. female chief complaint of medication reaction.  Patient took amoxicillin for cracked that she is she developed a diffuse confluent rash.  She felt some palate swelling but denies shortness of breath, change in patient has no previous allergic reaction to penicillins.  She denies feelings of presyncope, nausea, altered mental status.  HPI  Past Medical History:  Diagnosis Date  . Allergy    Rhinitis  . GERD (gastroesophageal reflux disease)   . History of nephrolithiasis 1996  . Osteopenia   . Peptic ulcer    need to avoid ASA per pt.  . Tobacco abuse   . Vision abnormalities     Patient Active Problem List   Diagnosis Date Noted  . Cervical radiculopathy 01/18/2017  . Numbness 12/16/2016  . Visual changes 12/16/2016  . Urinary hesitancy 12/16/2016  . Ulnar neuropathy 12/16/2016  . Dry mouth 12/16/2016  . Chronic insomnia 11/28/2016  . TIA (transient ischemic attack) 01/05/2016  . Hypokalemia 01/05/2016  . GERD (gastroesophageal reflux disease)   . Gastroesophageal reflux disease without esophagitis   . Tobacco abuse   . Anxiety 09/16/2010  . Shoulder pain 09/16/2010  . 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 Seasonal and perennial allergic rhinitis 01/09/2007  . HELLP SYNDROME 01/09/2007  . PRE-EC/ECLAMPSIA ON PRE-X HTN, ANTEPARTUM 01/09/2007  . NEPHROLITHIASIS, HX OF 01/09/2007    Past Surgical History:  Procedure Laterality Date  . cyst on vocal chord  1998     OB History    Gravida  8   Para      Term      Preterm      AB      Living  2     SAB      TAB      Ectopic      Multiple      Live Births               Home Medications    Prior to Admission medications   Medication Sig Start Date  End Date Taking? Authorizing Provider  Albuterol Sulfate (PROAIR RESPICLICK) 034 (90 Base) MCG/ACT AEPB Inhale into the lungs.    [provider]  amoxicillin-clavulanate (AUGMENTIN) 875-125 MG tablet Take 1 tablet by mouth 2 (two) times daily. 02/22/18   Burchette, Alinda Sierras, MD  calcium carbonate (TUMS - DOSED IN MG ELEMENTAL CALCIUM) 500 MG chewable tablet Chew 1 tablet by mouth daily as needed for indigestion or heartburn.    [provider]  cetirizine (ZYRTEC) 10 MG tablet Take 10 mg by mouth daily.      [provider]  estradiol-norethindrone [facility]) 0.05-0.14 MG/DAY Place 1 patch onto the skin 2 (two) times a week.    [provider]  LORazepam (ATIVAN) 0.5 MG tablet Take 1 tablet (0.5 mg total) by mouth at bedtime as needed. 02/22/18   Burchette, Alinda Sierras, MD    Family History Family History  Problem Relation Age of Onset  . Melanoma Daughter   . Hypertension Mother   . Scoliosis Father   . Healthy Brother   . Cancer Maternal Grandmother   . Cancer Maternal Grandfather     Social History Social History   Tobacco Use  . Smoking status:  Former Smoker    Last attempt to quit: 11/05/2016    Years since quitting: 1.4  . Smokeless tobacco: Never Used  Substance Use Topics  . Alcohol use: No    Alcohol/week: 0.0 standard drinks    Comment: rare  . Drug use: No     Allergies   Amoxicillin and Astelin   Review of Systems Review of Systems  Ten systems reviewed and are negative for acute change, except as noted in the HPI.   Physical Exam Updated Vital Signs BP (!) 139/109 (BP Location: Right Arm)   Pulse 96   Temp 98 F (36.7 C) (Oral)   Resp 18   Ht 5\' 9"  (1.753 m)   Wt 77.1 kg   LMP 04/02/2018   SpO2 100%   BMI 25.10 kg/m   Physical Exam  Constitutional: She is oriented to person, place, and time. She appears well-developed and well-nourished. No distress.  HENT:  Head: Normocephalic and atraumatic.  Oral mucosa is  clear without lesions or erythema  Eyes: Conjunctivae are normal. No scleral icterus.  Neck: Normal range of motion.  Cardiovascular: Normal rate, regular rhythm and normal heart sounds. Exam reveals no gallop and no friction rub.  No murmur heard. Pulmonary/Chest: Effort normal and breath sounds normal. No respiratory distress.  Abdominal: Soft. Bowel sounds are normal. She exhibits no distension and no mass. There is no tenderness. There is no guarding.  Neurological: She is alert and oriented to person, place, and time.  Skin: Skin is warm and dry. She is not diaphoretic.  Patient with full resolution of rash  Psychiatric: Her behavior is normal.  Nursing note and vitals reviewed.    ED Treatments / Results  Labs (all labs ordered are listed, but only abnormal results are displayed) Labs Reviewed - No data to display  EKG None  Radiology No results found.  Procedures Procedures (including critical care time)  Medications Ordered in ED Medications  diphenhydrAMINE (BENADRYL) capsule 25 mg (25 mg Oral Given 04/03/18 1658)  methylPREDNISolone sodium succinate (SOLU-MEDROL) 125 mg/2 mL injection 125 mg (125 mg Intramuscular Given 04/03/18 1700)  famotidine (PEPCID) tablet 20 mg (20 mg Oral Given 04/03/18 1658)     Initial Impression / Assessment and Plan / ED Course  I have reviewed the triage vital signs and the nursing notes.  Pertinent labs & imaging results that were available during my care of the patient were reviewed by me and considered in my medical decision making (see chart for details).     Patient re-evaluated prior to dc, is hemodynamically stable, in no respiratory distress, and denies the feeling of throat closing. So concern for SJS. Pt has been advised to take OTC benadryl, prednisone taper & return to the ED if they have a mod-severe allergic rxn (s/s including throat closing, difficulty breathing, swelling of lips face or tongue). Pt is to follow up with their  PCP. Pt is agreeable with plan & verbalizes understanding.   Final Clinical Impressions(s) / ED Diagnoses   Final diagnoses:  None    ED Discharge Orders    None       Margarita Mail, PA-C 04/03/18 2112    Deno Etienne, DO 04/03/18 2311

## 2018-04-03 NOTE — Discharge Instructions (Signed)
Contact a health care provider if: You think that you are having an allergic reaction. Symptoms of an allergic reaction usually start within 30 minutes after you are exposed to a medicine. You have symptoms that last more than 2 days after your reaction. Your symptoms get worse. You develop new symptoms.

## 2018-04-03 NOTE — Telephone Encounter (Signed)
FYI, patient called in for advice.  She had dental work done today and they gave her some Amoxicillin to take.  She stated she's usually not allergic to that antibiotic but the dentist told her to take two tablets right away, which totalled 1000mg .  She said her body started turning red hot and her tongue started to itch.  The nurse triage Iine was busy but the patient said she couldn't wait because her condition was getting worse as we spoke so her daughter was going to take her to the ER.

## 2018-04-24 ENCOUNTER — Telehealth: Payer: Self-pay | Admitting: Family Medicine

## 2018-04-24 NOTE — Telephone Encounter (Signed)
Called patient and scheduled her for tomorrow at 1:15pm for visit for thrush in mouth.

## 2018-04-24 NOTE — Telephone Encounter (Signed)
Copied from [place] [phone]. Topic: Quick Communication - See Telephone Encounter >> Apr 24, 2018  8:37 AM Blase Mess A wrote: CRM for notification. See Telephone encounter for: 04/24/18.  Patient is calling because she had an infection in her mouth and now she has trush she is requesting meds for thrush.  She was asked she wanted to come in.  She asked if Dr. Elease Hashimoto come prescrip her something. Patients call back  [phone] Preferred Pharmacy [id number] - [place], [place] [address] [place] [address] Phone: [phone] Fax: [phone]

## 2018-04-25 ENCOUNTER — Encounter: Payer: Self-pay | Admitting: Family Medicine

## 2018-04-25 ENCOUNTER — Other Ambulatory Visit: Payer: Self-pay

## 2018-04-25 ENCOUNTER — Ambulatory Visit: Payer: BLUE CROSS/BLUE SHIELD | Admitting: Family Medicine

## 2018-04-25 VITALS — BP 118/78 | HR 95 | Temp 98.4°F | Ht 69.0 in | Wt 174.9 lb

## 2018-04-25 DIAGNOSIS — K143 Hypertrophy of tongue papillae: Secondary | ICD-10-CM | POA: Diagnosis not present

## 2018-04-25 DIAGNOSIS — Z1231 Encounter for screening mammogram for malignant neoplasm of breast: Secondary | ICD-10-CM | POA: Diagnosis not present

## 2018-04-25 DIAGNOSIS — Z23 Encounter for immunization: Secondary | ICD-10-CM

## 2018-04-25 DIAGNOSIS — Z6826 Body mass index (BMI) 26.0-26.9, adult: Secondary | ICD-10-CM | POA: Diagnosis not present

## 2018-04-25 DIAGNOSIS — Z01419 Encounter for gynecological examination (general) (routine) without abnormal findings: Secondary | ICD-10-CM | POA: Diagnosis not present

## 2018-04-25 DIAGNOSIS — N898 Other specified noninflammatory disorders of vagina: Secondary | ICD-10-CM | POA: Diagnosis not present

## 2018-04-25 MED ORDER — NYSTATIN 100000 UNIT/ML MT SUSP: 5.0000 mL | Freq: Four times a day (QID) | OROMUCOSAL | 0 refills | Status: DC

## 2018-04-25 NOTE — Progress Notes (Signed)
  Subjective:     Patient ID: Kelly Woodward, female   DOB: August 10, 1960, 57 y.o.   MRN: [id number]  HPI Patient seen with whitish discoloration of her tongue.  Recent history is that she on September 9 broke off a tooth.  She went to dentist and was prescribed high-dose amoxicillin.  She developed diffuse pruritus and hives.  No true anaphylaxis.  She had some sensation of swelling of her tongue.  She was given Decadron along with Medrol Dosepak.  She was switched to clindamycin and took a full 10-day course.  She now has some whitish discoloration of the tongue.  She has not had any pain with swallowing.  No fevers or chills.  Past Medical History:  Diagnosis Date  . Allergy    Rhinitis  . GERD (gastroesophageal reflux disease)   . History of nephrolithiasis 1996  . Osteopenia   . Peptic ulcer    need to avoid ASA per pt.  . Tobacco abuse   . Vision abnormalities    Past Surgical History:  Procedure Laterality Date  . cyst on vocal chord  1998    reports that she quit smoking about 17 months ago. She has never used smokeless tobacco. She reports that she does not drink alcohol or use drugs. family history includes Cancer in her maternal grandfather and maternal grandmother; Healthy in her brother; Hypertension in her mother; Melanoma in her daughter; Scoliosis in her father. Allergies  Allergen Reactions  . Amoxicillin Anaphylaxis    Voice changes and feels like throat is swelling and rash  . Astelin Other (See Comments)    nosebleeds     Review of Systems  Constitutional: Negative for chills and fever.       Objective:   Physical Exam  Constitutional: She appears well-developed and well-nourished.  HENT:  She has some whitish discoloration of the dorsum of tongue with prominent papillae.  There is no evidence for any thrush in her posterior pharynx, the dorsum of the oropharynx, or buccal mucosa  Cardiovascular: Normal rate and regular rhythm.  Pulmonary/Chest: Effort  normal.       Assessment:     Patient presents with some whitish discoloration of the tongue.  Doubt thrush..  Suspect prominent papillae with whitish coating.    Plan:     -We did agree to going ahead with nystatin suspension to use 5 mL's 4 times daily -Follow-up for any persistent concerns.  Flu vaccine given  Eulas Post MD [facility] at [facility]

## 2018-04-25 NOTE — Patient Instructions (Signed)
Let me know if symptoms not improved with Nystatin.

## 2018-09-20 DIAGNOSIS — H43813 Vitreous degeneration, bilateral: Secondary | ICD-10-CM | POA: Diagnosis not present

## 2018-09-20 DIAGNOSIS — H2513 Age-related nuclear cataract, bilateral: Secondary | ICD-10-CM | POA: Diagnosis not present

## 2018-09-20 DIAGNOSIS — H43393 Other vitreous opacities, bilateral: Secondary | ICD-10-CM | POA: Diagnosis not present

## 2018-10-13 ENCOUNTER — Ambulatory Visit (INDEPENDENT_AMBULATORY_CARE_PROVIDER_SITE_OTHER): Payer: BLUE CROSS/BLUE SHIELD | Admitting: Family Medicine

## 2018-10-13 ENCOUNTER — Other Ambulatory Visit: Payer: Self-pay

## 2018-10-13 VITALS — BP 138/82 | HR 115

## 2018-10-13 DIAGNOSIS — R062 Wheezing: Secondary | ICD-10-CM

## 2018-10-13 MED ORDER — ALBUTEROL SULFATE HFA 108 (90 BASE) MCG/ACT IN AERS: 2.0000 | INHALATION_SPRAY | Freq: Four times a day (QID) | RESPIRATORY_TRACT | 0 refills | Status: DC | PRN

## 2018-10-13 MED ORDER — ALBUTEROL SULFATE 108 (90 BASE) MCG/ACT IN AEPB: 2.0000 | INHALATION_SPRAY | RESPIRATORY_TRACT | 2 refills | Status: DC | PRN

## 2018-10-13 MED ORDER — E-Z SPACER DEVI: 2 refills | Status: DC

## 2018-10-13 NOTE — Progress Notes (Signed)
  Kelly Woodward DOB: [birth year] Encounter date: 10/13/2018  This is a 58 y.o. female who presents with Chief Complaint  Patient presents with  . Cough    cough and fever x 2 days. no nasal congestion. some sore throat, night sweats, fever of 100.0, SOB, fatigue and chest tightness.  Traveled to [place] 3/18 for 1 day. works in [employer]. no sick contacts.    History of present illness:  HPI   Allergies  Allergen Reactions  . Amoxicillin Anaphylaxis    Voice changes and feels like throat is swelling and rash  . Astelin Other (See Comments)    nosebleeds   No outpatient medications have been marked as taking for the 10/13/18 encounter (Office Visit) with Caren Macadam, MD.    Review of Systems  Constitutional: Positive for appetite change (slight loss), fatigue (but states not very bad) and fever.  HENT: Positive for sore throat. Negative for congestion, ear pain, sinus pressure and sinus pain.   Respiratory: Positive for cough. Negative for shortness of breath and wheezing.   Cardiovascular: Negative for chest pain.    Objective:  BP 138/82 (BP Location: Left Arm, Patient Position: Sitting, Cuff Size: Normal)   Pulse (!) 115   SpO2 95%       BP Readings from Last 3 Encounters:  10/13/18 138/82  04/25/18 118/78  04/03/18 (!) 147/78   Wt Readings from Last 3 Encounters:  04/25/18 174 lb 14.4 oz (79.3 kg)  04/03/18 170 lb (77.1 kg)  02/22/18 173 lb 8 oz (78.7 kg)    Physical Exam Constitutional:      General: She is not in acute distress.    Appearance: Normal appearance. She is not ill-appearing, toxic-appearing or diaphoretic.  HENT:     Right Ear: Tympanic membrane, ear canal and external ear normal.     Left Ear: Tympanic membrane, ear canal and external ear normal.     Nose: Congestion present.     Mouth/Throat:     Mouth: Mucous membranes are moist.     Pharynx: Posterior oropharyngeal erythema present. No oropharyngeal exudate.     Tonsils:  No tonsillar exudate.     Comments: Cobblestoning oropharynx Cardiovascular:     Rate and Rhythm: Normal rate and regular rhythm.  Pulmonary:     Effort: Pulmonary effort is normal.     Breath sounds: Examination of the right-lower field reveals wheezing. Wheezing present. No decreased breath sounds, rhonchi or rales.  Neurological:     Mental Status: She is alert.     Assessment/Plan  1. 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 Inhaler given (hers was expired) to help with wheeze over weekend. Let us know if any worsening of symptoms. We will check in Monday with her.  - Spacer/Aero-Holding Chambers (E-Z SPACER) inhaler; Use as instructed  Dispense: 1 each; Refill: 2     Return if symptoms worsen or fail to improve.     Micheline Rough, MD

## 2018-11-22 ENCOUNTER — Other Ambulatory Visit: Payer: Self-pay | Admitting: Family Medicine

## 2018-11-24 ENCOUNTER — Telehealth: Payer: Self-pay | Admitting: Family Medicine

## 2018-11-24 NOTE — Telephone Encounter (Signed)
Last OV 10/13/18, No future OV  Last filled 02/22/18, # 30 with 0 refills

## 2018-11-24 NOTE — Telephone Encounter (Signed)
Copied from [place] [id number]. Topic: Quick Communication - Rx Refill/Question >> Nov 24, 2018 11:40 AM Gustavus Messing wrote: Medication: LORazepam (ATIVAN) 0.5 MG tablet [[id number]]   Has the patient contacted their pharmacy? Yes.   (Agent: If yes, when and what did the pharmacy advise?) The pharmacy told the patient that they had faxed over the medication request but there is no record of that. The patient stated that it was days ago that she spoke with the pharmacy and is still without the medication.   Preferred Pharmacy (with phone number or street name): [id number] - [place], [place] - [address] [phone] (Phone) [phone] (Fax)    Agent: Please be advised that RX refills may take up to 3 business days. We ask that you follow-up with your pharmacy.

## 2018-11-24 NOTE — Telephone Encounter (Signed)
Medication was sent to pharmacy by Dr. Elease Hashimoto today.

## 2018-12-15 ENCOUNTER — Encounter: Payer: Self-pay | Admitting: Family Medicine

## 2018-12-15 ENCOUNTER — Ambulatory Visit (INDEPENDENT_AMBULATORY_CARE_PROVIDER_SITE_OTHER): Payer: BLUE CROSS/BLUE SHIELD | Admitting: Family Medicine

## 2018-12-15 ENCOUNTER — Other Ambulatory Visit: Payer: Self-pay

## 2018-12-15 VITALS — BP 124/82 | HR 110 | Temp 98.6°F | Ht 69.0 in | Wt 169.1 lb

## 2018-12-15 DIAGNOSIS — Z0184 Encounter for antibody response examination: Secondary | ICD-10-CM

## 2018-12-15 DIAGNOSIS — L0211 Cutaneous abscess of neck: Secondary | ICD-10-CM

## 2018-12-15 NOTE — Progress Notes (Signed)
  Subjective:     Patient ID: Kelly Woodward, female   DOB: 06/20/1961, 58 y.o.   MRN: [id number]  HPI Patient is seen with concern for abscess behind the right ear.  Present for a couple weeks.  She has history of cystic acne and was treated with Accutane over 20 years ago.  She is had intermittent follicular inflammation in the past.  With currently is that she has had some mild tenderness.  She has applied heat several times.  No drainage.  Second issue is that she had respiratory illness back in mid March.  She has curiosity whether she could have had coronavirus.  She is requesting coronavirus testing at this time for antibodies.  Her respiratory symptoms have fully resolved.  Past Medical History:  Diagnosis Date  . Allergy    Rhinitis  . GERD (gastroesophageal reflux disease)   . History of nephrolithiasis 1996  . Osteopenia   . Peptic ulcer    need to avoid ASA per pt.  . Tobacco abuse   . Vision abnormalities    Past Surgical History:  Procedure Laterality Date  . 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 She has never used smokeless tobacco. She reports that she does not drink alcohol or use drugs. family history includes Cancer in her maternal grandfather and maternal grandmother; Healthy in her brother; Hypertension in her mother; Melanoma in her daughter; Scoliosis in her father. Allergies  Allergen Reactions  . Amoxicillin Anaphylaxis    Voice changes and feels like throat is swelling and rash  . Astelin Other (See Comments)    nosebleeds     Review of Systems  Constitutional: Negative for chills and fever.       Objective:   Physical Exam Constitutional:      Appearance: Normal appearance.  Cardiovascular:     Rate and Rhythm: Normal rate and regular rhythm.  Skin:    Comments: Area of erythema about 1/2 cm diameter posterior auricular region.  She does have some fluctuance.  Mild tenderness.  No visible pustules.   Neurological:     Mental Status: She is alert.        Assessment:     #1 small abscess right posterior auricular region  #2 recent respiratory illness back in March.  Patient has concern for possible COVID-19 infection though she is fully recovered at this time    Plan:     -Discussed risk and benefits of incision and drainage of abscess including risk of bleeding, pain, scarring.  Patient consented. -We have some 1% plain Xylocaine for local anesthesia.  Skin was prepped with alcohol.  Using #11 blade made a small approximate 1/2 cm incision over the area of fluctuance.  A small amount of purulence was expressed.  Using hemostats no deeper pockets of pus.  No packing required.  Minimal bleeding.  Patient tolerated well -Topical antibiotic and dressing applied -Continue warm compresses several times daily -Follow-up for any recurrent redness or swelling  -We did go ahead and order COVID-19 antibody per patient request.  She is aware of some of the clinical uncertainties regarding immunity status testing and degree of protection for individuals with positive antibody  Eulas Post MD [facility]

## 2018-12-15 NOTE — Patient Instructions (Addendum)
Skin Abscess  A skin abscess is an infected area on or under your skin that contains a collection of pus and other material. An abscess may also be called a furuncle, carbuncle, or boil. An abscess can occur in or on almost any part of your body. Some abscesses break open (rupture) on their own. Most continue to get worse unless they are treated. The infection can spread deeper into the body and eventually into your blood, which can make you feel ill. Treatment usually involves draining the abscess. What are the causes? An abscess occurs when germs, like bacteria, pass through your skin and cause an infection. This may be caused by:  A scrape or cut on your skin.  A puncture wound through your skin, including a needle injection or insect bite.  Blocked oil or sweat glands.  Blocked and infected hair follicles.  A cyst that forms beneath your skin (sebaceous cyst) and becomes infected. What increases the risk? This condition is more likely to develop in people who:  Have a weak body defense system (immune system).  Have diabetes.  Have dry and irritated skin.  Get frequent injections or use illegal IV drugs.  Have a foreign body in a wound, such as a splinter.  Have problems with their lymph system or veins. What are the signs or symptoms? Symptoms of this condition include:  A painful, firm bump under the skin.  A bump with pus at the top. This may break through the skin and drain. Other symptoms include:  Redness surrounding the abscess site.  Warmth.  Swelling of the lymph nodes (glands) near the abscess.  Tenderness.  A sore on the skin. How is this diagnosed? This condition may be diagnosed based on:  A physical exam.  Your medical history.  A sample of pus. This may be used to find out what is causing the infection.  Blood tests.  Imaging tests, such as an ultrasound, CT scan, or MRI. How is this treated? A small abscess that drains on its own may not  need treatment. Treatment for larger abscesses may include:  Moist heat or heat pack applied to the area several times a day.  A procedure to drain the abscess (incision and drainage).  Antibiotic medicines. For a severe abscess, you may first get antibiotics through an IV and then change to antibiotics by mouth. Follow these instructions at home: Medicines   Take over-the-counter and prescription medicines only as told by your health care provider.  If you were prescribed an antibiotic medicine, take it as told by your health care provider. Do not stop taking the antibiotic even if you start to feel better. Abscess care   If you have an abscess that has not drained, apply heat to the affected area. Use the heat source that your health care provider recommends, such as a moist heat pack or a heating pad. ? Place a towel between your skin and the heat source. ? Leave the heat on for 20-30 minutes. ? Remove the heat if your skin turns bright red. This is especially important if you are unable to feel pain, heat, or cold. You may have a greater risk of getting burned.  Follow instructions from your health care provider about how to take care of your abscess. Make sure you: ? Cover the abscess with a bandage (dressing). ? Change your dressing or gauze as told by your health care provider. ? Wash your hands with soap and water before you change the   dressing or gauze. If soap and water are not available, use hand sanitizer.  Check your abscess every day for signs of a worsening infection. Check for: ? More redness, swelling, or pain. ? More fluid or blood. ? Warmth. ? More pus or a bad smell. General instructions  To avoid spreading the infection: ? Do not share personal care items, towels, or hot tubs with others. ? Avoid making skin contact with other people.  Keep all follow-up visits as told by your health care provider. This is important. Contact a health care provider if you  have:  More redness, swelling, or pain around your abscess.  More fluid or blood coming from your abscess.  Warm skin around your abscess.  More pus or a bad smell coming from your abscess.  A fever.  Muscle aches.  Chills or a general ill feeling. Get help right away if you:  Have severe pain.  See red streaks on your skin spreading away from the abscess. Summary  A skin abscess is an infected area on or under your skin that contains a collection of pus and other material.  A small abscess that drains on its own may not need treatment.  Treatment for larger abscesses may include having a procedure to drain the abscess and taking an antibiotic. This information is not intended to replace advice given to you by your health care provider. Make sure you discuss any questions you have with your health care provider. Document Released: 04/21/2005 Document Revised: 08/25/2017 Document Reviewed: 08/25/2017 Elsevier Interactive Patient Education  2019 [employer] with warm compresses next few days  Follow up for any increased redness or recurrent swelling.

## 2018-12-16 LAB — SAR COV2 SEROLOGY (COVID19)AB(IGG),IA: SARS CoV2 AB IGG: NEGATIVE

## 2019-01-03 DIAGNOSIS — Z03818 Encounter for observation for suspected exposure to other biological agents ruled out: Secondary | ICD-10-CM | POA: Diagnosis not present

## 2019-02-05 ENCOUNTER — Other Ambulatory Visit: Payer: Self-pay

## 2019-02-05 ENCOUNTER — Ambulatory Visit (INDEPENDENT_AMBULATORY_CARE_PROVIDER_SITE_OTHER): Payer: BC Managed Care – PPO | Admitting: Family Medicine

## 2019-02-05 DIAGNOSIS — M545 Low back pain, unspecified: Secondary | ICD-10-CM

## 2019-02-05 NOTE — Progress Notes (Signed)
Patient ID: Kelly Woodward, female   DOB: July 11, 1961, 58 y.o.   MRN: [id number]  This visit type was conducted due to national recommendations for restrictions regarding the COVID-19 pandemic in an effort to limit this patient's exposure and mitigate transmission in our community.   Virtual Visit via Video Note  I connected with Kelly Woodward on 02/05/19 at  2:00 PM EDT by a video enabled telemedicine application and verified that I am speaking with the correct person using two identifiers.  Location patient: home Location provider:work or home office Persons participating in the virtual visit: patient, provider  I discussed the limitations of evaluation and management by telemedicine and the availability of in person appointments. The patient expressed understanding and agreed to proceed.   HPI: Patient is seen with low back pain.  She states she is had some intermittent tingling sensation left lower extremity for the past year and about 1 month ago had about 24 hours of fairly severe low back pain.  Her pain seemed to go away until Friday.  She noticed onset of mid line lower lumbar pain.  She took some Aleve without improvement.  Pain is intermittent.  Generally about 5 out of 10 in severity.  No recent injury.  Ambulating without difficulty.  Denies any associated appetite or weight change, fever, chills, dysuria, urine or stool incontinence, lower extremity weakness   ROS: See pertinent positives and negatives per HPI.  Past Medical History:  Diagnosis Date  . Allergy    Rhinitis  . GERD (gastroesophageal reflux disease)   . History of nephrolithiasis 1996  . Osteopenia   . Peptic ulcer    need to avoid ASA per pt.  . Tobacco abuse   . Vision abnormalities     Past Surgical History:  Procedure Laterality Date  . 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 Melanoma Daughter   . Hypertension Mother   . Scoliosis Father   . Healthy  Brother   . Cancer Maternal Grandmother   . Cancer Maternal Grandfather     SOCIAL HX: Non-smoker   Current Outpatient Medications:  .  albuterol (PROVENTIL HFA;VENTOLIN HFA) 108 (90 Base) MCG/ACT inhaler, Inhale 2 puffs into the lungs every 6 (six) hours as needed for wheezing or shortness of breath., Disp: 1 Inhaler, Rfl: 0 .  calcium carbonate (TUMS - DOSED IN MG ELEMENTAL CALCIUM) 500 MG chewable tablet, Chew 1 tablet by mouth daily as needed for indigestion or heartburn., Disp: , Rfl:  .  cetirizine (ZYRTEC) 10 MG tablet, Take 10 mg by mouth daily.  , Disp: , Rfl:  .  Cobalamine Combinations (B-12) [phone] MCG SUBL, B-12, Disp: , Rfl:  .  Ergocalciferol 2000 units CAPS, ergocalciferol (vitamin D2) 50,000 unit capsule, Disp: , Rfl:  .  estradiol-norethindrone (COMBIPATCH) 0.05-0.14 MG/DAY, Place 1 patch onto the skin 2 (two) times a week., Disp: , Rfl:  .  LORazepam (ATIVAN) 0.5 MG tablet, TAKE 1 TABLET (0.5 MG TOTAL) BY MOUTH AT BEDTIME AS NEEDED., Disp: 30 tablet, Rfl: 0 .  nystatin (MYCOSTATIN) 100000 UNIT/ML suspension, Take 5 mLs (500,000 Units total) by mouth 4 (four) times daily., Disp: 240 mL, Rfl: 0 .  Spacer/Aero-Holding Chambers (E-Z SPACER) inhaler, Use as instructed, Disp: 1 each, Rfl: 2  EXAM:  VITALS per patient if applicable:  GENERAL: alert, oriented, appears well and in no acute distress  HEENT: atraumatic, conjunttiva clear, no obvious abnormalities on inspection of  external nose and ears  NECK: normal movements of the head and neck  LUNGS: on inspection no signs of respiratory distress, breathing rate appears normal, no obvious gross SOB, gasping or wheezing  CV: no obvious cyanosis  MS: moves all visible extremities without noticeable abnormality  PSYCH/NEURO: pleasant and cooperative, no obvious depression or anxiety, speech and thought processing grossly intact  ASSESSMENT AND PLAN:  Discussed the following assessment and plan:  Low back pain  -without consistent sciatica symptoms  -Discussed conservative management with aerobic activity such as walking as tolerated, simple extension stretches -Caution with nonsteroidals with her past history of ulcer -Consider steroid injection as she has not tolerated oral steroids in the past if symptoms not improving over the next few days -She will try some heat or ice for symptom relief -Reviewed signs and symptoms of more worrisome back pain    I discussed the assessment and treatment plan with the patient. The patient was provided an opportunity to ask questions and all were answered. The patient agreed with the plan and demonstrated an understanding of the instructions.   The patient was advised to call back or seek an in-person evaluation if the symptoms worsen or if the condition fails to improve as anticipated.   Carolann Littler, MD

## 2019-04-20 ENCOUNTER — Ambulatory Visit: Payer: BC Managed Care – PPO | Admitting: Family Medicine

## 2019-04-20 ENCOUNTER — Other Ambulatory Visit: Payer: Self-pay

## 2019-04-20 ENCOUNTER — Encounter: Payer: Self-pay | Admitting: Family Medicine

## 2019-04-20 VITALS — BP 124/82 | HR 92 | Temp 97.3°F | Ht 69.0 in | Wt 177.2 lb

## 2019-04-20 DIAGNOSIS — M722 Plantar fascial fibromatosis: Secondary | ICD-10-CM | POA: Diagnosis not present

## 2019-04-20 MED ORDER — LORAZEPAM 0.5 MG PO TABS: 0.5000 mg | ORAL_TABLET | Freq: Every evening | ORAL | 0 refills | Status: DC | PRN

## 2019-04-20 MED ORDER — DICLOFENAC SODIUM 1 % TD GEL: 4.0000 g | Freq: Four times a day (QID) | TRANSDERMAL | 1 refills | Status: DC

## 2019-04-20 NOTE — Patient Instructions (Signed)
Plantar Fasciitis  Plantar fasciitis is a painful foot condition that affects the heel. It occurs when the band of tissue that connects the toes to the heel bone (plantar fascia) becomes irritated. This can happen as the result of exercising too much or doing other repetitive activities (overuse injury). The pain from plantar fasciitis can range from mild irritation to severe pain that makes it difficult to walk or move. The pain is usually worse in the morning after sleeping, or after sitting or lying down for a while. Pain may also be worse after long periods of walking or standing. What are the causes? This condition may be caused by:  Standing for long periods of time.  Wearing shoes that do not have good arch support.  Doing activities that put stress on joints (high-impact activities), including running, aerobics, and ballet.  Being overweight.  An abnormal way of walking (gait).  Tight muscles in the back of your lower leg (calf).  High arches in your feet.  Starting a new athletic activity. What are the signs or symptoms? The main symptom of this condition is heel pain. Pain may:  Be worse with first steps after a time of rest, especially in the morning after sleeping or after you have been sitting or lying down for a while.  Be worse after long periods of standing still.  Decrease after 30-45 minutes of activity, such as gentle walking. How is this diagnosed? This condition may be diagnosed based on your medical history and your symptoms. Your health care provider may ask questions about your activity level. Your health care provider will do a physical exam to check for:  A tender area on the bottom of your foot.  A high arch in your foot.  Pain when you move your foot.  Difficulty moving your foot. You may have imaging tests to confirm the diagnosis, such as:  X-rays.  Ultrasound.  MRI. How is this treated? Treatment for plantar fasciitis depends on how  severe your condition is. Treatment may include:  Rest, ice, applying pressure (compression), and raising the affected foot (elevation). This may be called RICE therapy. Your health care provider may recommend RICE therapy along with over-the-counter pain medicines to manage your pain.  Exercises to stretch your calves and your plantar fascia.  A splint that holds your foot in a stretched, upward position while you sleep (night splint).  Physical therapy to relieve symptoms and prevent problems in the future.  Injections of steroid medicine (cortisone) to relieve pain and inflammation.  Stimulating your plantar fascia with electrical impulses (extracorporeal shock wave therapy). This is usually the last treatment option before surgery.  Surgery, if other treatments have not worked after 12 months. Follow these instructions at home:  Managing pain, stiffness, and swelling  If directed, put ice on the painful area: ? Put ice in a plastic bag, or use a frozen bottle of water. ? Place a towel between your skin and the bag or bottle. ? Roll the bottom of your foot over the bag or bottle. ? Do this for 20 minutes, 2-3 times a day.  Wear athletic shoes that have air-sole or gel-sole cushions, or try wearing soft shoe inserts that are designed for plantar fasciitis.  Raise (elevate) your foot above the level of your heart while you are sitting or lying down. Activity  Avoid activities that cause pain. Ask your health care provider what activities are safe for you.  Do physical therapy exercises and stretches as told   by your health care provider.  Try activities and forms of exercise that are easier on your joints (low-impact). Examples include swimming, water aerobics, and biking. General instructions  Take over-the-counter and prescription medicines only as told by your health care provider.  Wear a night splint while sleeping, if told by your health care provider. Loosen the splint  if your toes tingle, become numb, or turn cold and blue.  Maintain a healthy weight, or work with your health care provider to lose weight as needed.  Keep all follow-up visits as told by your health care provider. This is important. Contact a health care provider if you:  Have symptoms that do not go away after caring for yourself at home.  Have pain that gets worse.  Have pain that affects your ability to move or do your daily activities. Summary  Plantar fasciitis is a painful foot condition that affects the heel. It occurs when the band of tissue that connects the toes to the heel bone (plantar fascia) becomes irritated.  The main symptom of this condition is heel pain that may be worse after exercising too much or standing still for a long time.  Treatment varies, but it usually starts with rest, ice, compression, and elevation (RICE therapy) and over-the-counter medicines to manage pain. This information is not intended to replace advice given to you by your health care provider. Make sure you discuss any questions you have with your health care provider. Document Released: 04/06/2001 Document Revised: 06/24/2017 Document Reviewed: 05/09/2017 Elsevier Patient Education  2020 [employer]  

## 2019-04-20 NOTE — Progress Notes (Signed)
  Subjective:     Patient ID: Kelly Woodward, female   DOB: 10/25/1960, 58 y.o.   MRN: [id number]  HPI Patient is seen with left heel pain for the past month or so but especially over the past week.  No injury.  She is concerned about plantar fasciitis.  Denies any Achilles tenderness.  Her pain is especially bad after prolonged periods of sitting when first getting up and also first thing in the morning.  She tried some icing a couple times but not repeatedly.  She has done some stretches.  Her main concern is whether she should be able to do some walking or whether she should limit activities.  She has had similar issue with plantar fasciitis once in the past.  Past Medical History:  Diagnosis Date  . Allergy    Rhinitis  . GERD (gastroesophageal reflux disease)   . History of nephrolithiasis 1996  . Osteopenia   . Peptic ulcer    need to avoid ASA per pt.  . Tobacco abuse   . Vision abnormalities    Past Surgical History:  Procedure Laterality Date  . 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 She has never used smokeless tobacco. She reports that she does not drink alcohol or use drugs. family history includes Cancer in her maternal grandfather and maternal grandmother; Healthy in her brother; Hypertension in her mother; Melanoma in her daughter; Scoliosis in her father. Allergies  Allergen Reactions  . Amoxicillin Anaphylaxis    Voice changes and feels like throat is swelling and rash  . Astelin Other (See Comments)    nosebleeds     Review of Systems  Neurological: Negative for weakness and numbness.       Objective:   Physical Exam Vitals signs reviewed.  Cardiovascular:     Rate and Rhythm: Normal rate and regular rhythm.  Musculoskeletal:     Comments: Left foot reveals some tenderness over the plantar fascia near the attachment to the calcaneus.  No Achilles tenderness.  No bony tenderness.        Assessment:     Left  plantar fasciitis    Plan:     -Recommend icing 15 to 20 minutes 2-3 times daily -Reviewed stretches and also reviewed several strengthening exercises for the plantar fascia -Consider trial of diclofenac gel 4 times daily -She will consider silicone gel insert for shoes -Offered corticosteroid injection but she would like to try the above first  Eulas Post MD [facility]

## 2019-05-31 ENCOUNTER — Encounter: Payer: Self-pay | Admitting: Family Medicine

## 2019-05-31 NOTE — Progress Notes (Unsigned)
Prior Auth came from [facility] for pt's diclofenac 1% gel. Submitted PA through [facility]. Key [id number]. Awaiting response from insurance

## 2019-12-04 DIAGNOSIS — Z01419 Encounter for gynecological examination (general) (routine) without abnormal findings: Secondary | ICD-10-CM | POA: Diagnosis not present

## 2019-12-04 DIAGNOSIS — Z124 Encounter for screening for malignant neoplasm of cervix: Secondary | ICD-10-CM | POA: Diagnosis not present

## 2019-12-04 DIAGNOSIS — Z1231 Encounter for screening mammogram for malignant neoplasm of breast: Secondary | ICD-10-CM | POA: Diagnosis not present

## 2019-12-04 LAB — HM PAP SMEAR

## 2019-12-12 ENCOUNTER — Encounter: Payer: Self-pay | Admitting: Family Medicine

## 2020-01-16 DIAGNOSIS — R928 Other abnormal and inconclusive findings on diagnostic imaging of breast: Secondary | ICD-10-CM | POA: Diagnosis not present

## 2020-02-19 ENCOUNTER — Other Ambulatory Visit: Payer: Self-pay | Admitting: Family Medicine

## 2020-03-18 ENCOUNTER — Other Ambulatory Visit: Payer: Self-pay

## 2020-03-18 ENCOUNTER — Ambulatory Visit: Payer: Self-pay | Admitting: Family Medicine

## 2020-03-18 ENCOUNTER — Encounter: Payer: Self-pay | Admitting: Family Medicine

## 2020-03-18 VITALS — BP 140/84 | HR 92 | Temp 98.6°F | Wt 166.0 lb

## 2020-03-18 DIAGNOSIS — R350 Frequency of micturition: Secondary | ICD-10-CM

## 2020-03-18 LAB — POCT URINALYSIS DIPSTICK
Bilirubin, UA: NEGATIVE
Blood, UA: NEGATIVE
Glucose, UA: NEGATIVE
Ketones, UA: NEGATIVE
Nitrite, UA: NEGATIVE
Protein, UA: NEGATIVE
Spec Grav, UA: 1.02 (ref 1.010–1.025)
Urobilinogen, UA: NEGATIVE E.U./dL — AB
pH, UA: 5 (ref 5.0–8.0)

## 2020-03-18 MED ORDER — NITROFURANTOIN MONOHYD MACRO 100 MG PO CAPS: 100.0000 mg | ORAL_CAPSULE | Freq: Two times a day (BID) | ORAL | 0 refills | Status: DC

## 2020-03-18 NOTE — Progress Notes (Signed)
Established Patient Office Visit  Subjective:  Patient ID: Kelly Woodward, female    DOB: 07/19/1961  Age: 59 y.o. MRN: [id number]  CC:  Chief Complaint  Patient presents with  . Urinary Frequency    pt states since last night and some burning after going     HPI Kelly Woodward presents for some urine frequency and burning which just started last night.  Her pain is mostly at the end of her urination.  She has not had any fever, chills, flank pain, nausea, vomiting, or fever.  She states she was diagnosed with Covid over 2 weeks ago.  She is well past the quarantine.  She has been fully vaccinated and her symptoms are very mild.  She has had some loss of taste and smell but really no other residual symptoms.  Past Medical History:  Diagnosis Date  . Allergy    Rhinitis  . GERD (gastroesophageal reflux disease)   . History of nephrolithiasis 1996  . Osteopenia   . Peptic ulcer    need to avoid ASA per pt.  . Tobacco abuse   . Vision abnormalities     Past Surgical History:  Procedure Laterality Date  . 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 Melanoma Daughter   . Hypertension Mother   . Scoliosis Father   . Healthy Brother   . Cancer Maternal Grandmother   . Cancer Maternal Grandfather     Social History   Socioeconomic History  . Marital status: Married    Spouse name: Not on file  . Number of children: 2  . Years of education: 30  . Highest education level: Not on file  Occupational History  . Occupation: music therapist  Tobacco Use  . Smoking status: Former Smoker    Quit date: 11/05/2016    Years since quitting: 3.3  . Smokeless tobacco: Never Used  Vaping Use  . Vaping Use: Never used  Substance and Sexual Activity  . Alcohol use: No    Alcohol/week: 0.0 standard drinks    Comment: rare  . Drug use: No  . Sexual activity: Not on file  Other Topics Concern  . Not on file  Social History Narrative   Lives  with husband and daughters in a 2 story home.     Works as a Civil engineer, contracting (self-employed with personal business)   Education: [employer]   Social Determinants of Radio broadcast assistant Strain:   . Difficulty of Paying Living Expenses: Not on file  Food Insecurity:   . Worried About Charity fundraiser in the Last Year: Not on file  . Ran Out of Food in the Last Year: Not on file  Transportation Needs:   . Lack of Transportation (Medical): Not on file  . Lack of Transportation (Non-Medical): Not on file  Physical Activity:   . Days of Exercise per Week: Not on file  . Minutes of Exercise per Session: Not on file  Stress:   . Feeling of Stress : Not on file  Social Connections:   . Frequency of Communication with Friends and Family: Not on file  . Frequency of Social Gatherings with Friends and Family: Not on file  . Attends Religious Services: Not on file  . Active Member of Clubs or Organizations: Not on file  . Attends Archivist Meetings: Not on file  . Marital Status: Not on file  Intimate Partner Violence:   . Fear of Current or Ex-Partner: Not on file  . Emotionally Abused: Not on file  . Physically Abused: Not on file  . Sexually Abused: Not on file    Outpatient Medications Prior to Visit  Medication Sig Dispense Refill  . albuterol (PROVENTIL HFA;VENTOLIN HFA) 108 (90 Base) MCG/ACT inhaler Inhale 2 puffs into the lungs every 6 (six) hours as needed for wheezing or shortness of breath. 1 Inhaler 0  . calcium carbonate (TUMS - DOSED IN MG ELEMENTAL CALCIUM) 500 MG chewable tablet Chew 1 tablet by mouth daily as needed for indigestion or heartburn.    . cetirizine (ZYRTEC) 10 MG tablet Take 10 mg by mouth daily.      . Cobalamine Combinations (B-12) [phone] MCG SUBL B-12    . diclofenac sodium (VOLTAREN) 1 % GEL Apply 4 g topically 4 (four) times daily. 100 g 1  . Ergocalciferol 2000 units CAPS ergocalciferol (vitamin D2) 50,000 unit capsule    .  estradiol-norethindrone (COMBIPATCH) 0.05-0.14 MG/DAY Place 1 patch onto the skin 2 (two) times a week.    Marland Kitchen LORazepam (ATIVAN) 0.5 MG tablet TAKE 1 TABLET (0.5 MG TOTAL) BY MOUTH AT BEDTIME AS NEEDED. 30 tablet 0  . nystatin (MYCOSTATIN) 100000 UNIT/ML suspension Take 5 mLs (500,000 Units total) by mouth 4 (four) times daily. 240 mL 0  . Spacer/Aero-Holding Chambers (E-Z SPACER) inhaler Use as instructed 1 each 2   No facility-administered medications prior to visit.    Allergies  Allergen Reactions  . Amoxicillin Anaphylaxis    Voice changes and feels like throat is swelling and rash  . Astelin Other (See Comments)    nosebleeds    ROS Review of Systems  Constitutional: Negative for appetite change, chills and fever.  Gastrointestinal: Negative for abdominal pain, constipation, diarrhea, nausea and vomiting.  Genitourinary: Positive for dysuria and frequency.  Musculoskeletal: Negative for back pain.  Neurological: Negative for dizziness.      Objective:    Physical Exam Vitals reviewed.  Constitutional:      General: She is not in acute distress.    Appearance: She is not ill-appearing.  Cardiovascular:     Rate and Rhythm: Normal rate and regular rhythm.  Pulmonary:     Effort: Pulmonary effort is normal.     Breath sounds: Normal breath sounds.  Neurological:     Mental Status: She is alert.     BP 140/84 (BP Location: Left Arm, Patient Position: Sitting, Cuff Size: Normal)   Pulse 92   Temp 98.6 F (37 C) (Oral)   Wt 166 lb (75.3 kg)   LMP 04/02/2018   SpO2 98%   BMI 24.51 kg/m  Wt Readings from Last 3 Encounters:  03/18/20 166 lb (75.3 kg)  04/20/19 177 lb 3.2 oz (80.4 kg)  12/15/18 169 lb 1.6 oz (76.7 kg)     Health Maintenance Due  Topic Date Due  . HIV Screening  Never done  . MAMMOGRAM  01/04/2015  . INFLUENZA VACCINE  02/24/2020    There are no preventive care reminders to display for this patient.  Lab Results  Component Value Date    TSH 1.04 09/15/2016   Lab Results  Component Value Date   WBC 8.7 01/05/2016   HGB 12.7 01/05/2016   HCT 37.7 01/05/2016   MCV 92.0 01/05/2016   PLT 353 01/05/2016   Lab Results  Component Value Date   NA 139 09/15/2016   K 3.9 09/15/2016  CO2 28 09/15/2016   GLUCOSE 91 09/15/2016   BUN 19 09/15/2016   CREATININE 0.67 09/15/2016   BILITOT 0.8 01/05/2016   ALKPHOS 65 01/05/2016   AST 19 01/05/2016   ALT 13 (L) 01/05/2016   PROT 7.4 01/05/2016   ALBUMIN 4.4 01/05/2016   CALCIUM 9.3 09/15/2016   ANIONGAP 7 01/05/2016   GFR 96.88 09/15/2016   Lab Results  Component Value Date   CHOL 152 01/06/2016   Lab Results  Component Value Date   HDL 42 01/06/2016   Lab Results  Component Value Date   LDLCALC 94 01/06/2016   Lab Results  Component Value Date   TRIG 78 01/06/2016   Lab Results  Component Value Date   CHOLHDL 3.6 01/06/2016   Lab Results  Component Value Date   HGBA1C 5.3 01/06/2016      Assessment & Plan:   Problem List Items Addressed This Visit    None    Visit Diagnoses    Frequent urination    -  Primary   Relevant Orders   POCT urinalysis dipstick (Completed)   Culture, Urine    1 day history of dysuria with some mild burning and frequency.  Urine dipstick shows only small leukocytes otherwise negative.  -Urine culture sent -Plenty of fluids -Start Macrobid 1 twice daily for 5 days pending culture results -Follow-up for any persistent or worsening symptoms  Meds ordered this encounter  Medications  . nitrofurantoin, macrocrystal-monohydrate, (MACROBID) 100 MG capsule    Sig: Take 1 capsule (100 mg total) by mouth 2 (two) times daily.    Dispense:  10 capsule    Refill:  0    Follow-up: No follow-ups on file.    Carolann Littler, MD

## 2020-03-18 NOTE — Patient Instructions (Signed)

## 2020-03-19 LAB — URINE CULTURE
MICRO NUMBER:: 10865047
SPECIMEN QUALITY:: ADEQUATE

## 2020-07-03 ENCOUNTER — Telehealth: Payer: Self-pay | Admitting: Family Medicine

## 2020-07-04 NOTE — Telephone Encounter (Signed)
Pt has only had acute visits since 2018 and the only visit in the last year was for frequent urination in September.  Please advise

## 2020-07-04 NOTE — Telephone Encounter (Signed)
The patient is uninsured right now and she is not paying $80 for an office visit just to get her Rx refilled that she has been taking for a long time.  She needs the Rx refilled as soon as possible.  Please advise

## 2020-07-05 MED ORDER — LORAZEPAM 0.5 MG PO TABS
0.5000 mg | ORAL_TABLET | Freq: Every evening | ORAL | 0 refills | Status: DC | PRN
Start: 2020-07-05 — End: 2021-06-03

## 2020-07-05 NOTE — Addendum Note (Signed)
Addended by: Eulas Post on: 07/05/2020 02:32 PM   Modules accepted: Orders

## 2020-07-05 NOTE — Telephone Encounter (Signed)
Refill sent.

## 2021-06-02 ENCOUNTER — Other Ambulatory Visit: Payer: Self-pay

## 2021-06-03 ENCOUNTER — Ambulatory Visit (INDEPENDENT_AMBULATORY_CARE_PROVIDER_SITE_OTHER): Payer: 59 | Admitting: Family Medicine

## 2021-06-03 VITALS — BP 130/90 | HR 95 | Temp 98.3°F | Wt 173.4 lb

## 2021-06-03 DIAGNOSIS — H04302 Unspecified dacryocystitis of left lacrimal passage: Secondary | ICD-10-CM

## 2021-06-03 MED ORDER — DOXYCYCLINE HYCLATE 100 MG PO CAPS: 100.0000 mg | ORAL_CAPSULE | Freq: Two times a day (BID) | ORAL | 0 refills | Status: DC

## 2021-06-03 MED ORDER — LORAZEPAM 0.5 MG PO TABS
0.5000 mg | ORAL_TABLET | Freq: Every evening | ORAL | 0 refills | Status: DC | PRN
Start: 2021-06-03 — End: 2021-10-28

## 2021-06-03 NOTE — Progress Notes (Signed)
Established Patient Office Visit  Subjective:  Patient ID: Kelly Woodward, female    DOB: 1960-12-28  Age: 60 y.o. MRN: [id number]  CC:  Chief Complaint  Patient presents with   Facial Pain    Around the left eye, x 3 days, swollen, very painful     HPI Kelly Woodward presents for left facial swelling near her tear duct region for past few days.  Denies any injury.  She states that Sunday she did a lot of crying.  She is not had any discharge in the eye.  No visual changes.  She has had some associated pain and mild redness and increased swelling.  No nasal congestive symptoms.  She has history of allergy to amoxicillin with what sounds like possible early anaphylaxis.  Thus cannot take penicillins or cephalosporins.  No known history of MRSA.  Past Medical History:  Diagnosis Date   Allergy    Rhinitis   GERD (gastroesophageal reflux disease)    History of nephrolithiasis 1996   Osteopenia    Peptic ulcer    need to avoid ASA per pt.   Tobacco abuse    Vision abnormalities     Past Surgical History:  Procedure Laterality Date   cyst on vocal chord  1998    Family History  Problem Relation Age of Onset   Melanoma Daughter    Hypertension Mother    Scoliosis Father    Healthy Brother    Cancer Maternal Grandmother    Cancer Maternal Grandfather     Social History   Socioeconomic History   Marital status: Married    Spouse name: Not on file   Number of children: 2   Years of education: 16   Highest education level: Bachelor's degree (e.g., BA, AB, BS)  Occupational History   Occupation: music therapist  Tobacco Use   Smoking status: Former    Types: Cigarettes    Quit date: 11/05/2016    Years since quitting: 4.5   Smokeless tobacco: Never  Vaping Use   Vaping Use: Never used  Substance and Sexual Activity   Alcohol use: No    Alcohol/week: 0.0 standard drinks    Comment: rare   Drug use: No   Sexual activity: Not on file  Other Topics Concern    Not on file  Social History Narrative   Lives with husband and daughters in a 2 story home.     Works as a Civil engineer, contracting (self-employed with personal business)   Education: [employer]   Social Determinants of Radio broadcast assistant Strain: Low Risk    Difficulty of Paying Living Expenses: Not very hard  Food Insecurity: No Food Insecurity   Worried About Charity fundraiser in the Last Year: Never true   Arboriculturist in the Last Year: Never true  Transportation Needs: No Transportation Needs   Lack of Transportation (Medical): No   Lack of Transportation (Non-Medical): No  Physical Activity: Sufficiently Active   Days of Exercise per Week: 4 days   Minutes of Exercise per Session: 40 min  Stress: No Stress Concern Present   Feeling of Stress : Only a little  Social Connections: Engineer, building services of Communication with Friends and Family: More than three times a week   Frequency of Social Gatherings with Friends and Family: Twice a week   Attends Religious Services: More than 4 times per year   Active Member of [employer] or Organizations: Yes  Attends Music therapist: More than 4 times per year   Marital Status: Married  Human resources officer Violence: Not on file    Outpatient Medications Prior to Visit  Medication Sig Dispense Refill   albuterol (PROVENTIL HFA;VENTOLIN HFA) 108 (90 Base) MCG/ACT inhaler Inhale 2 puffs into the lungs every 6 (six) hours as needed for wheezing or shortness of breath. 1 Inhaler 0   calcium carbonate (TUMS - DOSED IN MG ELEMENTAL CALCIUM) 500 MG chewable tablet Chew 1 tablet by mouth daily as needed for indigestion or heartburn.     cetirizine (ZYRTEC) 10 MG tablet Take 10 mg by mouth daily.       Cobalamine Combinations (B-12) [phone] MCG SUBL B-12     diclofenac sodium (VOLTAREN) 1 % GEL Apply 4 g topically 4 (four) times daily. 100 g 1   estradiol-norethindrone (COMBIPATCH) 0.05-0.14 MG/DAY Place 1 patch onto the skin 2  (two) times a week.     Spacer/Aero-Holding Chambers (E-Z SPACER) inhaler Use as instructed 1 each 2   LORazepam (ATIVAN) 0.5 MG tablet Take 1 tablet (0.5 mg total) by mouth at bedtime as needed. 30 tablet 0   Ergocalciferol 2000 units CAPS ergocalciferol (vitamin D2) 50,000 unit capsule     nitrofurantoin, macrocrystal-monohydrate, (MACROBID) 100 MG capsule Take 1 capsule (100 mg total) by mouth 2 (two) times daily. 10 capsule 0   nystatin (MYCOSTATIN) 100000 UNIT/ML suspension Take 5 mLs (500,000 Units total) by mouth 4 (four) times daily. 240 mL 0   No facility-administered medications prior to visit.    Allergies  Allergen Reactions   Amoxicillin Anaphylaxis    Voice changes and feels like throat is swelling and rash   Astelin Other (See Comments)    nosebleeds    ROS Review of Systems  Constitutional:  Negative for chills and fever.  HENT:  Negative for congestion and sinus pressure.   Neurological:  Negative for headaches.     Objective:    Physical Exam Vitals reviewed.  HENT:     Head:     Comments: Just some mild swelling nasolacrimal area on the left side.  Very mild erythema.  Slightly tender to palpation.  Conjunctive appear normal bilaterally.  Extraocular movements are intact. Cardiovascular:     Rate and Rhythm: Normal rate and regular rhythm.  Neurological:     Mental Status: She is alert.    BP 130/90 (BP Location: Left Arm, Patient Position: Sitting, Cuff Size: Normal)   Pulse 95   Temp 98.3 F (36.8 C) (Oral)   Wt 173 lb 6.4 oz (78.7 kg)   LMP 04/02/2018   SpO2 99%   BMI 25.61 kg/m  Wt Readings from Last 3 Encounters:  06/03/21 173 lb 6.4 oz (78.7 kg)  03/18/20 166 lb (75.3 kg)  04/20/19 177 lb 3.2 oz (80.4 kg)     Health Maintenance Due  Topic Date Due   Pneumococcal Vaccine 68-14 Years old (1 - PCV) Never done   HIV Screening  Never done   Zoster Vaccines- Shingrix (1 of 2) Never done   MAMMOGRAM  01/04/2015   COVID-19 Vaccine (3 -  Moderna risk series) 10/03/2019   INFLUENZA VACCINE  02/23/2021    There are no preventive care reminders to display for this patient.  Lab Results  Component Value Date   TSH 1.04 09/15/2016   Lab Results  Component Value Date   WBC 8.7 01/05/2016   HGB 12.7 01/05/2016   HCT 37.7 01/05/2016   MCV  92.0 01/05/2016   PLT 353 01/05/2016   Lab Results  Component Value Date   NA 139 09/15/2016   K 3.9 09/15/2016   CO2 28 09/15/2016   GLUCOSE 91 09/15/2016   BUN 19 09/15/2016   CREATININE 0.67 09/15/2016   BILITOT 0.8 01/05/2016   ALKPHOS 65 01/05/2016   AST 19 01/05/2016   ALT 13 (L) 01/05/2016   PROT 7.4 01/05/2016   ALBUMIN 4.4 01/05/2016   CALCIUM 9.3 09/15/2016   ANIONGAP 7 01/05/2016   GFR 96.88 09/15/2016   Lab Results  Component Value Date   CHOL 152 01/06/2016   Lab Results  Component Value Date   HDL 42 01/06/2016   Lab Results  Component Value Date   LDLCALC 94 01/06/2016   Lab Results  Component Value Date   TRIG 78 01/06/2016   Lab Results  Component Value Date   CHOLHDL 3.6 01/06/2016   Lab Results  Component Value Date   HGBA1C 5.3 01/06/2016      Assessment & Plan:   Probable early dacryocystitis left eye.  Patient nontoxic.  No fever.  No evidence for periorbital or Pre-septal cellulitis at this time.  She has history of anaphylaxis with amoxicillin  -Start doxycycline 100 mg twice daily -Gentle massage of nasolacrimal duct -Follow-up promptly for any fever, progressive swelling, or any progressive redness  Meds ordered this encounter  Medications   doxycycline (VIBRAMYCIN) 100 MG capsule    Sig: Take 1 capsule (100 mg total) by mouth 2 (two) times daily.    Dispense:  20 capsule    Refill:  0   LORazepam (ATIVAN) 0.5 MG tablet    Sig: Take 1 tablet (0.5 mg total) by mouth at bedtime as needed.    Dispense:  30 tablet    Refill:  0    This request is for a new prescription for a controlled substance as required by  Federal/State law.PATIENT REQUESTS NEW RX ASAP.    Follow-up: No follow-ups on file.    Carolann Littler, MD

## 2021-06-04 ENCOUNTER — Other Ambulatory Visit: Payer: Self-pay

## 2021-06-04 ENCOUNTER — Encounter (HOSPITAL_BASED_OUTPATIENT_CLINIC_OR_DEPARTMENT_OTHER): Payer: Self-pay | Admitting: Emergency Medicine

## 2021-06-04 ENCOUNTER — Emergency Department (HOSPITAL_BASED_OUTPATIENT_CLINIC_OR_DEPARTMENT_OTHER)
Admission: EM | Admit: 2021-06-04 | Discharge: 2021-06-04 | Disposition: A | Discharge status: Home / Self Care | Payer: PRIVATE HEALTH INSURANCE | Attending: Emergency Medicine | Admitting: Emergency Medicine

## 2021-06-04 ENCOUNTER — Telehealth: Payer: Self-pay

## 2021-06-04 DIAGNOSIS — Z87891 Personal history of nicotine dependence: Secondary | ICD-10-CM | POA: Diagnosis not present

## 2021-06-04 DIAGNOSIS — L03211 Cellulitis of face: Secondary | ICD-10-CM | POA: Diagnosis not present

## 2021-06-04 DIAGNOSIS — R519 Headache, unspecified: Secondary | ICD-10-CM | POA: Diagnosis present

## 2021-06-04 LAB — COMPREHENSIVE METABOLIC PANEL
ALT: 11 U/L (ref 0–44)
AST: 23 U/L (ref 15–41)
Albumin: 4.4 g/dL (ref 3.5–5.0)
Alkaline Phosphatase: 71 U/L (ref 38–126)
Anion gap: 10 (ref 5–15)
BUN: 16 mg/dL (ref 6–20)
CO2: 24 mmol/L (ref 22–32)
Calcium: 9.6 mg/dL (ref 8.9–10.3)
Chloride: 103 mmol/L (ref 98–111)
Creatinine, Ser: 0.58 mg/dL (ref 0.44–1.00)
GFR, Estimated: >60 mL/min (ref >60)
Glucose, Bld: 110 mg/dL — ABNORMAL HIGH (ref 70–99)
Potassium: 4.2 mmol/L (ref 3.5–5.1)
Sodium: 137 mmol/L (ref 135–145)
Total Bilirubin: 0.7 mg/dL (ref 0.3–1.2)
Total Protein: 8.1 g/dL (ref 6.5–8.1)

## 2021-06-04 LAB — URINALYSIS, ROUTINE W REFLEX MICROSCOPIC
Bilirubin Urine: NEGATIVE
Glucose, UA: NEGATIVE mg/dL
Hgb urine dipstick: NEGATIVE
Ketones, ur: NEGATIVE mg/dL
Leukocytes,Ua: NEGATIVE
Nitrite: NEGATIVE
Protein, ur: NEGATIVE mg/dL
Specific Gravity, Urine: 1.007 (ref 1.005–1.030)
pH: 6 (ref 5.0–8.0)

## 2021-06-04 LAB — CBC WITH DIFFERENTIAL/PLATELET
Abs Immature Granulocytes: 0.04 10*3/uL (ref 0.00–0.07)
Basophils Absolute: 0 10*3/uL (ref 0.0–0.1)
Basophils Relative: 0 %
Eosinophils Absolute: 0 10*3/uL (ref 0.0–0.5)
Eosinophils Relative: 0 %
HCT: 41.2 % (ref 36.0–46.0)
Hemoglobin: 13.4 g/dL (ref 12.0–15.0)
Immature Granulocytes: 0 %
Lymphocytes Relative: 10 %
Lymphs Abs: 1.1 10*3/uL (ref 0.7–4.0)
MCH: 30.2 pg (ref 26.0–34.0)
MCHC: 32.5 g/dL (ref 30.0–36.0)
MCV: 92.8 fL (ref 80.0–100.0)
Monocytes Absolute: 0.6 10*3/uL (ref 0.1–1.0)
Monocytes Relative: 5 %
Neutro Abs: 9.2 10*3/uL — ABNORMAL HIGH (ref 1.7–7.7)
Neutrophils Relative %: 85 %
Platelets: 333 10*3/uL (ref 150–400)
RBC: 4.44 MIL/uL (ref 3.87–5.11)
RDW: 12.9 % (ref 11.5–15.5)
WBC: 11 10*3/uL — ABNORMAL HIGH (ref 4.0–10.5)
nRBC: 0 % (ref 0.0–0.2)

## 2021-06-04 LAB — LACTIC ACID, PLASMA: Lactic Acid, Venous: 1.2 mmol/L (ref 0.5–1.9)

## 2021-06-04 NOTE — Telephone Encounter (Signed)
Patient called stating that she was seen yesterday and the rash has gotten worst and she now has a fever and would like to know what she should do

## 2021-06-04 NOTE — ED Triage Notes (Signed)
Pt from home with facial swelling and pain across bridge of her nose and under her eyes. Pt saw pcp yesterday and was put on doxycycline for suspected cellulitis. Pt states she has had 3 doses and that the swelling and pain are worse today. Pt denies any difficulty breathing. Pt alert & oriented, nad noted.

## 2021-06-04 NOTE — ED Provider Notes (Signed)
[facility] DE[facility] Provider Note   CSN: [id number] Arrival date & time: 06/04/21  1711     History Chief Complaint  Patient presents with   Facial Pain    Kelly Woodward is a 60 y.o. female.  Patient was seen yesterday by [facility].  They felt that she was developing early dacryocystitis of the left eye.  Patient states really pain initially kind of more to the bridge of the nose has some puffiness and redness towards the left but today has some puffy and redness towards the right with some induration.  Was started yesterday on doxycycline.  Patient is [place] but it will be to the [place] area.  She was concerned that she is not better yet but has not been on the antibiotics very long.  Patient has a significant penicillin allergy.  Patient is never had anything like this before.  She feels like it is on the surface skin area not deep in the nose.  Patient thought she had a fever but no true fever.  Temp was less than 100.4.      Past Medical History:  Diagnosis Date   Allergy    Rhinitis   GERD (gastroesophageal reflux disease)    History of nephrolithiasis 1996   Osteopenia    Peptic ulcer    need to avoid ASA per pt.   Tobacco abuse    Vision abnormalities     Patient Active Problem List   Diagnosis Date Noted   Cervical radiculopathy 01/18/2017   Numbness 12/16/2016   Visual changes 12/16/2016   Urinary hesitancy 12/16/2016   Ulnar neuropathy 12/16/2016   Dry mouth 12/16/2016   Chronic insomnia 11/28/2016   TIA (transient ischemic attack) 01/05/2016   Hypokalemia 01/05/2016   GERD (gastroesophageal reflux disease)    Gastroesophageal reflux disease without esophagitis    Tobacco abuse    Anxiety 09/16/2010   Shoulder pain 09/16/2010   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   Seasonal and perennial allergic rhinitis 01/09/2007   HELLP SYNDROME 01/09/2007   PRE-EC/ECLAMPSIA ON PRE-X HTN, ANTEPARTUM  01/09/2007   NEPHROLITHIASIS, HX OF 01/09/2007    Past Surgical History:  Procedure Laterality Date   cyst on vocal chord  1998     OB History     Gravida  8   Para      Term      Preterm      AB      Living  2      SAB      IAB      Ectopic      Multiple      Live Births              Family History  Problem Relation Age of Onset   Melanoma Daughter    Hypertension Mother    Scoliosis Father    Healthy Brother    Cancer Maternal Grandmother    Cancer Maternal Grandfather     Social History   Tobacco Use   Smoking status: Former    Types: Cigarettes    Quit date: 11/05/2016    Years since quitting: 4.6   Smokeless tobacco: Never  Vaping Use   Vaping Use: Never used  Substance Use Topics   Alcohol use: No    Alcohol/week: 0.0 standard drinks    Comment: rare   Drug use: No    Home Medications Prior to Admission medications   Medication Sig Start  Date End Date Taking? Authorizing Provider  albuterol (PROVENTIL HFA;VENTOLIN HFA) 108 (90 Base) MCG/ACT inhaler Inhale 2 puffs into the lungs every 6 (six) hours as needed for wheezing or shortness of breath. 10/13/18   Koberlein, Steele Berg, MD  calcium carbonate (TUMS - DOSED IN MG ELEMENTAL CALCIUM) 500 MG chewable tablet Chew 1 tablet by mouth daily as needed for indigestion or heartburn.    [provider]  cetirizine (ZYRTEC) 10 MG tablet Take 10 mg by mouth daily.      [provider]  Cobalamine Combinations (B-12) [phone] MCG SUBL B-12    [provider]  diclofenac sodium (VOLTAREN) 1 % GEL Apply 4 g topically 4 (four) times daily. 04/20/19   Burchette, Alinda Sierras, MD  doxycycline (VIBRAMYCIN) 100 MG capsule Take 1 capsule (100 mg total) by mouth 2 (two) times daily. 06/03/21   Burchette, Alinda Sierras, MD  estradiol-norethindrone [facility]) 0.05-0.14 MG/DAY Place 1 patch onto the skin 2 (two) times a week.    [provider]  LORazepam (ATIVAN) 0.5 MG tablet Take 1  tablet (0.5 mg total) by mouth at bedtime as needed. 06/03/21   Burchette, Alinda Sierras, MD  Spacer/Aero-Holding Chambers (E-Z SPACER) inhaler Use as instructed 10/13/18   Caren Macadam, MD    Allergies    Amoxicillin and Astelin  Review of Systems   Review of Systems  Constitutional:  Negative for chills and fever.  HENT:  Positive for facial swelling. Negative for ear pain and sore throat.   Eyes:  Negative for pain and visual disturbance.  Respiratory:  Negative for cough and shortness of breath.   Cardiovascular:  Negative for chest pain and palpitations.  Gastrointestinal:  Negative for abdominal pain and vomiting.  Genitourinary:  Negative for dysuria and hematuria.  Musculoskeletal:  Negative for arthralgias and back pain.  Skin:  Negative for color change and rash.  Neurological:  Negative for seizures and syncope.  All other systems reviewed and are negative.  Physical Exam Updated Vital Signs BP 121/76 (BP Location: Right Arm)   Pulse 92   Temp 98.4 F (36.9 C) (Oral)   Resp 18   Ht 1.753 m (5\' 9" )   Wt 77.1 kg   LMP 04/02/2018   SpO2 100%   BMI 25.10 kg/m   Physical Exam Vitals and nursing note reviewed.  Constitutional:      General: She is not in acute distress.    Appearance: Normal appearance. She is well-developed.  HENT:     Head: Normocephalic.     Comments: Erythema to the bridge of the nose erythema to both cheek area.  Some induration to the right lateral aspect of the nose.  No induration to the left lateral aspect of the nose.  No nasal discharge.  No pain with eye movement. Eyes:     Conjunctiva/sclera: Conjunctivae normal.     Pupils: Pupils are equal, round, and reactive to light.  Cardiovascular:     Rate and Rhythm: Normal rate and regular rhythm.     Heart sounds: No murmur heard. Pulmonary:     Effort: Pulmonary effort is normal. No respiratory distress.     Breath sounds: Normal breath sounds.  Abdominal:     Palpations: Abdomen is  soft.     Tenderness: There is no abdominal tenderness.  Musculoskeletal:        General: Normal range of motion.     Cervical back: Normal range of motion and neck supple.  Skin:  General: Skin is warm and dry.     Capillary Refill: Capillary refill takes less than 2 seconds.  Neurological:     General: No focal deficit present.     Mental Status: She is alert and oriented to person, place, and time.     Cranial Nerves: No cranial nerve deficit.    ED Results / Procedures / Treatments   Labs (all labs ordered are listed, but only abnormal results are displayed) Labs Reviewed  COMPREHENSIVE METABOLIC PANEL - Abnormal; Notable for the following components:      Result Value   Glucose, Bld 110 (*)    All other components within normal limits  CBC WITH DIFFERENTIAL/PLATELET - Abnormal; Notable for the following components:   WBC 11.0 (*)    Neutro Abs 9.2 (*)    All other components within normal limits  URINALYSIS, ROUTINE W REFLEX MICROSCOPIC - Abnormal; Notable for the following components:   Color, Urine COLORLESS (*)    All other components within normal limits  LACTIC ACID, PLASMA    EKG None  Radiology No results found.  Procedures Procedures   Medications Ordered in ED Medications - No data to display  ED Course  I have reviewed the triage vital signs and the nursing notes.  Pertinent labs & imaging results that were available during my care of the patient were reviewed by me and considered in my medical decision making (see chart for details).    MDM Rules/Calculators/A&P                          Most likely this is a cellulitis.  Could be secondary to an intranasal small abscess.  Not able to completely assess.  Patient's only started doxycycline yesterday.  Patient has no pain with ocular movement so no concerns for orbital cellulitis.  Considered may be giving her a dose of Rocephin here IV since she is going out of town but with her severe penicillin  allergy if she had a significant allergic reaction that would be bad.  Filled we determined that she will just continue the doxycycline patient's okay with that.  She will follow-up with the hospital in [place] if things get worse.  Suspect she will do fine.  Complete metabolic panel is normal.  CBC with a slight leukocytosis at 11,000.  Platelets are normal.  Lactic acid is normal.  Urinalysis normal.  These labs were ordered in triage.     Final Clinical Impression(s) / ED Diagnoses Final diagnoses:  Facial cellulitis    Rx / DC Orders ED Discharge Orders     None        Fredia Sorrow, MD 06/12/21 [phone]

## 2021-06-04 NOTE — Telephone Encounter (Signed)
Per Tommi Rumps "If fever is above 101 without tylenol then I would have her be reassessed tomorrow. If fever is greater than 101 then she may want to be seen at [facility]."  Spoke with the patient. She is aware of Cory's message. Patient was offered multiple appointment with multiple providers for tomorrow and declined. Patient stated she will go to urgent care.   Will send to Dr. Elease Hashimoto as Juluis Rainier

## 2021-06-04 NOTE — Discharge Instructions (Signed)
Continue the doxycycline antibiotic.  Would expect improvement over the next 2 days.  Would not expect things to be significantly worse tomorrow.  Could get a little bit worse but not significantly worse.  While up in the [place] area follow-up with the [facility] if things get worse.  Hopefully the antibiotics will get this under control very high probability they will.

## 2021-06-05 ENCOUNTER — Ambulatory Visit: Payer: 59 | Admitting: Family Medicine

## 2021-09-25 ENCOUNTER — Telehealth: Payer: Self-pay | Admitting: Family Medicine

## 2021-09-25 NOTE — Telephone Encounter (Addendum)
Pt was sent to triage nurse and is calling back at  419 pm. Pt has been having diarrhea for 13 days and will take triage nurse advise and go to urgent care. I gave the pt address and phone number to [facility]  ?

## 2021-09-28 NOTE — Telephone Encounter (Signed)
Left a message for the pt to return my call.  

## 2021-09-28 NOTE — Telephone Encounter (Signed)
---  caller states she has had bad diarrhea for 2 weeks ?now. she has tried a lot of different home remedies but ?nothing seems to help. no fever. ? ?09/25/2021 4:13:53 PM See HCP within 4 Hours (or ?PCP triage) Hassell Done, RN, Joelene Millin ? ?Referrals ?GO TO FACILITY UNDECIDED ? ?09/28/21 1127: Called to check on patient. States over 2 weeks ago, after eating [place], she noted diarrhea x 2 days. It improved slighty but was unable to eat more solid foods. Took Pepto & eating bland foods, but unable to eat more than that. ?Pt did a virtual appt on 09/25/21, was prescribed Flagyl. Is following BRAT diet & has not noted episode of diarrhea since then. Pt states she has lost 7lbs. Pt states she will complete flagyl regimen on 10/06/21. Pt advised that if symptoms have not improved then she should schedule an appt with Dr Elease Hashimoto. Pt verb understanding. ?

## 2021-10-26 ENCOUNTER — Ambulatory Visit (INDEPENDENT_AMBULATORY_CARE_PROVIDER_SITE_OTHER): Payer: 59 | Admitting: Family Medicine

## 2021-10-26 ENCOUNTER — Encounter: Payer: Self-pay | Admitting: Family Medicine

## 2021-10-26 VITALS — BP 120/74 | HR 92 | Temp 98.0°F | Ht 69.0 in | Wt 161.4 lb

## 2021-10-26 DIAGNOSIS — Z0000 Encounter for general adult medical examination without abnormal findings: Secondary | ICD-10-CM | POA: Diagnosis not present

## 2021-10-26 NOTE — Patient Instructions (Signed)
Consider Shingrix vaccine- check on insurance coverage first.  ?

## 2021-10-26 NOTE — Progress Notes (Signed)
? ?Established Patient Office Visit ? ?Subjective:  ?Patient ID: Kelly Woodward, female    DOB: 02-28-1961  Age: 62 y.o. MRN: [id number] ? ?CC:  ?Chief Complaint  ?Patient presents with  ? Annual Exam  ? ? ?HPI ?Milus Height presents for preventative physical exam.  Generally fairly healthy.  She has history of Raynaud's syndrome.  Past history of B12 and vitamin D deficiency.  She had some chronic insomnia.  Past history of kidney stones.  Past history of nicotine use.  She is attempting to lose some weight.  Has been doing intermittent fasting since 1 February having good success with over 10 pounds of weight loss already. ? ?Health maintenance reviewed ? ?-She sees gynecologist for Pap smears and mammogram and these are up-to-date. ? ?-Previous hepatitis C screen negative ?-Colonoscopy due 2026 ?-Tetanus due 2027 ?-No history of shingles vaccine. ? ?Family history-her father apparently died of cirrhosis complications for hepatitis B.  He had esophageal varices.  Her father was adopted.  Her mother is in her early 85s and very active.  History of hypertension.  Maternal grandmother with colon cancer history.  Both her mother and one of her daughters have had melanoma. ? ?Social history she is married.  She has 2 daughters.  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  Quit smoking 2018.  Very light smoker.  Less than 20-pack-year history. ? ?Past Medical History:  ?Diagnosis Date  ? Allergy   ? Rhinitis  ? GERD (gastroesophageal reflux disease)   ? History of nephrolithiasis 1996  ? Osteopenia   ? Peptic ulcer   ? need to avoid ASA per pt.  ? Tobacco abuse   ? Vision abnormalities   ? ? ?Past Surgical History:  ?Procedure Laterality Date  ? cyst on vocal chord  1998  ? ? ?Family History  ?Problem Relation Age of Onset  ? Hypertension Mother   ? Scoliosis Father   ? Cirrhosis Father   ?     hep B  ? Healthy Brother   ? Melanoma Daughter   ? Cancer Maternal Grandmother   ?     colon cancer  ? Cancer Maternal  Grandfather   ?     glioblastoma  ? ? ?Social History  ? ?Socioeconomic History  ? Marital status: Married  ?  Spouse name: Not on file  ? Number of children: 2  ? Years of education: 69  ? Highest education level: Bachelor's degree (e.g., BA, AB, BS)  ?Occupational History  ? Occupation: music therapist  ?Tobacco Use  ? Smoking status: Former  ?  Types: Cigarettes  ?  Quit date: 11/05/2016  ?  Years since quitting: 4.9  ? Smokeless tobacco: Never  ?Vaping Use  ? Vaping Use: Never used  ?Substance and Sexual Activity  ? Alcohol use: No  ?  Alcohol/week: 0.0 standard drinks  ?  Comment: rare  ? Drug use: No  ? Sexual activity: Not on file  ?Other Topics Concern  ? Not on file  ?Social History Narrative  ? Lives with husband and daughters in a 2 story home.    ? Works as a Civil engineer, contracting (self-employed with personal business)  ? Education: BS  ? ?Social Determinants of Health  ? ?Financial Resource Strain: Low Risk   ? Difficulty of Paying Living Expenses: Not very hard  ?Food Insecurity: No Food Insecurity  ? Worried About Charity fundraiser in the Last Year: Never true  ? Ran Out of  Food in the Last Year: Never true  ?Transportation Needs: No Transportation Needs  ? Lack of Transportation (Medical): No  ? Lack of Transportation (Non-Medical): No  ?Physical Activity: Sufficiently Active  ? Days of Exercise per Week: 4 days  ? Minutes of Exercise per Session: 40 min  ?Stress: No Stress Concern Present  ? Feeling of Stress : Only a little  ?Social Connections: Socially Integrated  ? Frequency of Communication with Friends and Family: More than three times a week  ? Frequency of Social Gatherings with Friends and Family: Twice a week  ? Attends Religious Services: More than 4 times per year  ? Active Member of Clubs or Organizations: Yes  ? Attends Archivist Meetings: More than 4 times per year  ? Marital Status: Married  ?Intimate Partner Violence: Not on file  ? ? ?Outpatient Medications Prior to Visit   ?Medication Sig Dispense Refill  ? albuterol (PROVENTIL HFA;VENTOLIN HFA) 108 (90 Base) MCG/ACT inhaler Inhale 2 puffs into the lungs every 6 (six) hours as needed for wheezing or shortness of breath. 1 Inhaler 0  ? calcium carbonate (TUMS - DOSED IN MG ELEMENTAL CALCIUM) 500 MG chewable tablet Chew 1 tablet by mouth daily as needed for indigestion or heartburn.    ? cetirizine (ZYRTEC) 10 MG tablet Take 10 mg by mouth daily.      ? Cobalamine Combinations (B-12) [phone] MCG SUBL B-12    ? diclofenac sodium (VOLTAREN) 1 % GEL Apply 4 g topically 4 (four) times daily. 100 g 1  ? doxycycline (VIBRAMYCIN) 100 MG capsule Take 1 capsule (100 mg total) by mouth 2 (two) times daily. 20 capsule 0  ? LORazepam (ATIVAN) 0.5 MG tablet Take 1 tablet (0.5 mg total) by mouth at bedtime as needed. 30 tablet 0  ? Spacer/Aero-Holding Chambers (E-Z SPACER) inhaler Use as instructed 1 each 2  ? estradiol-norethindrone (COMBIPATCH) 0.05-0.14 MG/DAY Place 1 patch onto the skin 2 (two) times a week.    ? estradiol (ESTRACE) 0.1 MG/GM vaginal cream estradiol 0.01% (0.1 mg/gram) vaginal cream ? INSERT 1GRAM EVERY DAY BY VAGINAL ROUTE    ? ?No facility-administered medications prior to visit.  ? ? ?Allergies  ?Allergen Reactions  ? Amoxicillin Anaphylaxis  ?  Voice changes and feels like throat is swelling and rash  ? Astelin Other (See Comments)  ?  nosebleeds  ? ? ?ROS ?Review of Systems  ?Constitutional:  Negative for activity change, appetite change, fatigue, fever and unexpected weight change.  ?HENT:  Negative for ear pain, hearing loss, sore throat and trouble swallowing.   ?Eyes:  Negative for visual disturbance.  ?Respiratory:  Negative for cough and shortness of breath.   ?Cardiovascular:  Negative for chest pain and palpitations.  ?Gastrointestinal:  Negative for abdominal pain, blood in stool, constipation and diarrhea.  ?Genitourinary:  Negative for dysuria and hematuria.  ?Musculoskeletal:  Negative for arthralgias, back  pain and myalgias.  ?Skin:  Negative for rash.  ?Neurological:  Negative for dizziness, syncope and headaches.  ?Hematological:  Negative for adenopathy.  ?Psychiatric/Behavioral:  Negative for confusion and dysphoric mood.   ? ?  ?Objective:  ?  ?Physical Exam ?Constitutional:   ?   Appearance: She is well-developed.  ?HENT:  ?   Head: Normocephalic and atraumatic.  ?Eyes:  ?   Pupils: Pupils are equal, round, and reactive to light.  ?Neck:  ?   Thyroid: No thyromegaly.  ?Cardiovascular:  ?   Rate and Rhythm: Normal rate and  regular rhythm.  ?   Heart sounds: Normal heart sounds. No murmur heard. ?Pulmonary:  ?   Effort: No respiratory distress.  ?   Breath sounds: Normal breath sounds. No wheezing or rales.  ?Abdominal:  ?   General: Bowel sounds are normal. There is no distension.  ?   Palpations: Abdomen is soft. There is no mass.  ?   Tenderness: There is no abdominal tenderness. There is no guarding or rebound.  ?Genitourinary: ?   Comments: Per GYN ?Musculoskeletal:     ?   General: Normal range of motion.  ?   Cervical back: Normal range of motion and neck supple.  ?Lymphadenopathy:  ?   Cervical: No cervical adenopathy.  ?Skin: ?   Findings: No rash.  ?Neurological:  ?   Mental Status: She is alert and oriented to person, place, and time.  ?   Cranial Nerves: No cranial nerve deficit.  ?Psychiatric:     ?   Behavior: Behavior normal.     ?   Thought Content: Thought content normal.     ?   Judgment: Judgment normal.  ? ? ?BP 120/74 (BP Location: Left Arm, Patient Position: Sitting, Cuff Size: Normal)   Pulse 92   Temp 98 ?F (36.7 ?C) (Oral)   Ht '5\' 9"'$  (1.753 m)   Wt 161 lb 6.4 oz (73.2 kg)   LMP 04/02/2018   SpO2 98%   BMI 23.83 kg/m?  ?Wt Readings from Last 3 Encounters:  ?10/26/21 161 lb 6.4 oz (73.2 kg)  ?06/04/21 170 lb (77.1 kg)  ?06/03/21 173 lb 6.4 oz (78.7 kg)  ? ? ? ?Health Maintenance Due  ?Topic Date Due  ? HIV Screening  Never done  ? Zoster Vaccines- Shingrix (1 of 2) Never done   ? ? ?There are no preventive care reminders to display for this patient. ? ?Lab Results  ?Component Value Date  ? TSH 1.04 09/15/2016  ? ?Lab Results  ?Component Value Date  ? WBC 11.0 (H) 06/04/2021  ? HGB 13.4 06/04/2021

## 2021-10-27 ENCOUNTER — Other Ambulatory Visit (INDEPENDENT_AMBULATORY_CARE_PROVIDER_SITE_OTHER): Payer: 59

## 2021-10-27 DIAGNOSIS — Z0000 Encounter for general adult medical examination without abnormal findings: Secondary | ICD-10-CM | POA: Diagnosis not present

## 2021-10-27 LAB — CBC WITH DIFFERENTIAL/PLATELET
Basophils Absolute: 0 10*3/uL (ref 0.0–0.1)
Basophils Relative: 0.7 % (ref 0.0–3.0)
Eosinophils Absolute: 0.1 10*3/uL (ref 0.0–0.7)
Eosinophils Relative: 1.2 % (ref 0.0–5.0)
HCT: 38.3 % (ref 36.0–46.0)
Hemoglobin: 13 g/dL (ref 12.0–15.0)
Lymphocytes Relative: 17.1 % (ref 12.0–46.0)
Lymphs Abs: 1.1 10*3/uL (ref 0.7–4.0)
MCHC: 33.9 g/dL (ref 30.0–36.0)
MCV: 92.2 fl (ref 78.0–100.0)
Monocytes Absolute: 0.4 10*3/uL (ref 0.1–1.0)
Monocytes Relative: 6.3 % (ref 3.0–12.0)
Neutro Abs: 4.7 10*3/uL (ref 1.4–7.7)
Neutrophils Relative %: 74.7 % (ref 43.0–77.0)
Platelets: 320 10*3/uL (ref 150.0–400.0)
RBC: 4.16 Mil/uL (ref 3.87–5.11)
RDW: 13.9 % (ref 11.5–15.5)
WBC: 6.3 10*3/uL (ref 4.0–10.5)

## 2021-10-27 LAB — BASIC METABOLIC PANEL
BUN: 21 mg/dL (ref 6–23)
CO2: 27 mEq/L (ref 19–32)
Calcium: 10.3 mg/dL (ref 8.4–10.5)
Chloride: 103 mEq/L (ref 96–112)
Creatinine, Ser: 0.64 mg/dL (ref 0.40–1.20)
GFR: 95.8 mL/min (ref >60.00)
Glucose, Bld: 88 mg/dL (ref 70–99)
Potassium: 4.4 mEq/L (ref 3.5–5.1)
Sodium: 138 mEq/L (ref 135–145)

## 2021-10-27 LAB — HEPATIC FUNCTION PANEL
ALT: 17 U/L (ref 0–35)
AST: 21 U/L (ref 0–37)
Albumin: 4.4 g/dL (ref 3.5–5.2)
Alkaline Phosphatase: 65 U/L (ref 39–117)
Bilirubin, Direct: 0.1 mg/dL (ref 0.0–0.3)
Total Bilirubin: 0.6 mg/dL (ref 0.2–1.2)
Total Protein: 7 g/dL (ref 6.0–8.3)

## 2021-10-27 LAB — TSH: TSH: 2.17 u[IU]/mL (ref 0.35–5.50)

## 2021-10-27 LAB — LIPID PANEL
Cholesterol: 190 mg/dL (ref 0–200)
HDL: 57.3 mg/dL (ref >39.00)
LDL Cholesterol: 122 mg/dL — ABNORMAL HIGH (ref 0–99)
NonHDL: 132.57
Total CHOL/HDL Ratio: 3
Triglycerides: 52 mg/dL (ref 0.0–149.0)
VLDL: 10.4 mg/dL (ref 0.0–40.0)

## 2021-10-28 ENCOUNTER — Other Ambulatory Visit: Payer: Self-pay

## 2021-10-28 MED ORDER — LORAZEPAM 0.5 MG PO TABS: 0.5000 mg | ORAL_TABLET | Freq: Every evening | ORAL | 0 refills | Status: DC | PRN

## 2022-01-29 ENCOUNTER — Telehealth: Payer: Self-pay | Admitting: Family Medicine

## 2022-01-29 NOTE — Telephone Encounter (Addendum)
Pt is calling because she no longer has Friday insurance and would like to know which insurances the MD accepts so that she can try to get it.  Please call her back at:  [phone]

## 2022-01-29 NOTE — Telephone Encounter (Signed)
I left a detailed message at the patient's cell number stating she should be receiving a letter from our office which will state to look into these plans: [employer], [employer] (not [employer] or [employer]), [employer] and [employer].

## 2022-07-06 ENCOUNTER — Ambulatory Visit (INDEPENDENT_AMBULATORY_CARE_PROVIDER_SITE_OTHER): Payer: Commercial Managed Care - HMO | Admitting: Family Medicine

## 2022-07-06 ENCOUNTER — Encounter: Payer: Self-pay | Admitting: Family Medicine

## 2022-07-06 VITALS — BP 154/86 | HR 85 | Temp 98.1°F | Ht 69.0 in | Wt 173.2 lb

## 2022-07-06 DIAGNOSIS — R030 Elevated blood-pressure reading, without diagnosis of hypertension: Secondary | ICD-10-CM | POA: Diagnosis not present

## 2022-07-06 DIAGNOSIS — R300 Dysuria: Secondary | ICD-10-CM | POA: Diagnosis not present

## 2022-07-06 DIAGNOSIS — F5104 Psychophysiologic insomnia: Secondary | ICD-10-CM | POA: Diagnosis not present

## 2022-07-06 LAB — POC URINALSYSI DIPSTICK (AUTOMATED)
Bilirubin, UA: NEGATIVE
Blood, UA: NEGATIVE
Glucose, UA: NEGATIVE
Ketones, UA: NEGATIVE
Nitrite, UA: NEGATIVE
Protein, UA: NEGATIVE
Spec Grav, UA: 1.025 (ref 1.010–1.025)
Urobilinogen, UA: 0.2 E.U./dL
pH, UA: 6 (ref 5.0–8.0)

## 2022-07-06 MED ORDER — NITROFURANTOIN MONOHYD MACRO 100 MG PO CAPS: 100.0000 mg | ORAL_CAPSULE | Freq: Two times a day (BID) | ORAL | 0 refills | Status: DC

## 2022-07-06 MED ORDER — LORAZEPAM 0.5 MG PO TABS: 0.5000 mg | ORAL_TABLET | Freq: Every evening | ORAL | 0 refills | Status: DC | PRN

## 2022-07-06 NOTE — Patient Instructions (Signed)
Monitor blood pressure and be in touch if consistently > 140/90 over next couple of months.

## 2022-07-06 NOTE — Progress Notes (Signed)
Established Patient Office Visit  Subjective   Patient ID: Kelly Woodward, female    DOB: [birth year]  Age: 61 y.o. MRN: [id number]  Chief Complaint  Patient presents with   Urinary Tract Infection   Back Pain    X2 days    Urinary Frequency    X2 days     HPI   [name] is here today to discuss several things as follows  She has had 1 day history of increased urine frequency.  She got up multiple times last night to go to the bathroom which is atypical for her.  She also noticed some burning with urination a couple days ago and little bit earlier today.  She has had some mild left flank pain.  No fevers or chills.  No gross hematuria.  No cloudy urine.  She has reported allergy to amoxicillin.  She states she has tolerated Keflex in the past.  She had some recent weight gain.  She has gained about 12 pounds since she was here last spring and blood pressure is up today without prior diagnosis of hypertension.  No recent headaches.  No dizziness.  She admits that her diet has been less disciplined over the past several weeks.  She plans to go on a more strict eating plan in January.  She has history of transient and intermittent insomnia.  She does use lorazepam infrequently over the years and requesting refill.  Past Medical History:  Diagnosis Date   Allergy    Rhinitis   GERD (gastroesophageal reflux disease)    History of nephrolithiasis 1996   Osteopenia    Peptic ulcer    need to avoid ASA per pt.   Tobacco abuse    Vision abnormalities    Past Surgical History:  Procedure Laterality Date   cyst on vocal chord  1998    reports that she quit smoking about 5 years ago. Her smoking use included cigarettes. She has never used smokeless tobacco. She reports that she does not drink alcohol and does not use drugs. family history includes Cancer in her maternal grandfather and maternal grandmother; Cirrhosis in her father; Healthy in her brother; Hypertension in her mother;  Melanoma in her daughter; Scoliosis in her father. Allergies  Allergen Reactions   Amoxicillin Anaphylaxis    Voice changes and feels like throat is swelling and rash   Astelin Other (See Comments)    nosebleeds    Review of Systems  Constitutional:  Negative for chills, fever and malaise/fatigue.  Eyes:  Negative for blurred vision.  Respiratory:  Negative for shortness of breath.   Cardiovascular:  Negative for chest pain.  Genitourinary:  Positive for dysuria and frequency. Negative for hematuria.  Neurological:  Negative for dizziness, weakness and headaches.  Psychiatric/Behavioral:  The patient has insomnia.       Objective:     BP (!) 154/86 (BP Location: Left Arm, Patient Position: Sitting, Cuff Size: Normal)   Pulse 85   Temp 98.1 F (36.7 C) (Oral)   Ht '5\' 9"'$  (1.753 m)   Wt 173 lb 3.2 oz (78.6 kg)   LMP 04/02/2018   SpO2 99%   BMI 25.58 kg/m    Physical Exam Vitals reviewed.  Constitutional:      Appearance: Normal appearance.  Cardiovascular:     Rate and Rhythm: Normal rate and regular rhythm.  Pulmonary:     Effort: Pulmonary effort is normal.     Breath sounds: Normal breath sounds.  Neurological:  Mental Status: She is alert.      Results for orders placed or performed in visit on 07/06/22  POCT Urinalysis Dipstick (Automated)  Result Value Ref Range   Color, UA Yellow    Clarity, UA Clear    Glucose, UA Negative Negative   Bilirubin, UA Negative    Ketones, UA Negative    Spec Grav, UA 1.025 1.010 - 1.025   Blood, UA Negative    pH, UA 6.0 5.0 - 8.0   Protein, UA Negative Negative   Urobilinogen, UA 0.2 0.2 or 1.0 E.U./dL   Nitrite, UA Negative    Leukocytes, UA Trace (A) Negative      The 10-year ASCVD risk score (Arnett DK, et al., 2019) is: 4.9%    Assessment & Plan:   #1 dysuria.  Urine dipstick reveals only trace leukocytes.  However, she has had very similar symptoms in the past with UTI. -Urine culture sent -We  elected to go and cover with Macrobid 1 twice daily for 5 days pending culture results -Plenty fluids  #2 elevated blood pressure without diagnosis of hypertension.  Repeat reading left and right arm seated after rest 146/82.  We recommended 2 to 42-monthtrial of lifestyle modification with weight loss, more consistent aerobic exercise, and sodium reduction.  Handout on DASH diet given.  She sees her gynecologist in January and will get blood pressure rechecked then.  Be in touch if blood pressure not consistently less than 140/90 over the next couple months with weight loss efforts  #3 transient insomnia.  Refilled lorazepam 0.5 mg 1 nightly as needed for severe insomnia #30 with no refill   No follow-ups on file.    BCarolann Littler MD

## 2022-07-08 LAB — URINE CULTURE
MICRO NUMBER:: 14303482
SPECIMEN QUALITY:: ADEQUATE

## 2022-07-09 ENCOUNTER — Telehealth: Payer: Self-pay | Admitting: Family Medicine

## 2022-07-09 MED ORDER — CLINDAMYCIN HCL 300 MG PO CAPS: ORAL_CAPSULE | ORAL | 0 refills | Status: DC

## 2022-07-09 NOTE — Telephone Encounter (Signed)
Patient returning call for results 

## 2022-07-09 NOTE — Telephone Encounter (Signed)
Please see result note 

## 2022-07-09 NOTE — Addendum Note (Signed)
Addended by: Nilda Riggs on: 07/09/2022 01:28 PM   Modules accepted: Orders

## 2022-07-12 ENCOUNTER — Other Ambulatory Visit: Payer: Self-pay

## 2022-07-12 ENCOUNTER — Telehealth: Payer: Self-pay | Admitting: Family Medicine

## 2022-07-12 DIAGNOSIS — R300 Dysuria: Secondary | ICD-10-CM

## 2022-07-12 NOTE — Telephone Encounter (Signed)
Patient informed of the message and expressed understanding. Lab orders placed

## 2022-07-12 NOTE — Telephone Encounter (Signed)
Treated 07/06/22 for a UTI, experiencing more symptoms, thinks it may be kidney stones. Wants to drop off urine specimen, declined OV

## 2022-07-13 ENCOUNTER — Telehealth: Payer: Self-pay | Admitting: Family Medicine

## 2022-07-13 ENCOUNTER — Other Ambulatory Visit: Payer: Self-pay | Admitting: Family Medicine

## 2022-07-13 DIAGNOSIS — R109 Unspecified abdominal pain: Secondary | ICD-10-CM

## 2022-07-13 LAB — URINALYSIS, ROUTINE W REFLEX MICROSCOPIC
Bilirubin Urine: NEGATIVE
Hgb urine dipstick: NEGATIVE
Leukocytes,Ua: NEGATIVE
Nitrite: NEGATIVE
RBC / HPF: NONE SEEN (ref 0–?)
Specific Gravity, Urine: 1.02 (ref 1.000–1.030)
Total Protein, Urine: NEGATIVE
Urine Glucose: NEGATIVE
Urobilinogen, UA: 0.2 (ref 0.0–1.0)
WBC, UA: NONE SEEN (ref 0–?)
pH: 6.5 (ref 5.0–8.0)

## 2022-07-13 LAB — URINE CULTURE
MICRO NUMBER:: 14328593
SPECIMEN QUALITY:: ADEQUATE

## 2022-07-13 NOTE — Telephone Encounter (Signed)
Pt called requesting to speak to CMA.   Pt was asked if it was regarding a medication refill, as I could help with that. Pt stated it was to discuss her kidney stones and would rather speak directly to CMA.   Pt asked that CMA call back at his earliest convenience.

## 2022-07-13 NOTE — Telephone Encounter (Signed)
Patient informed of the message and verbalized understanding 

## 2022-07-13 NOTE — Progress Notes (Signed)
I have placed order for CT renal stone study  Eulas Post MD [facility]

## 2022-07-13 NOTE — Telephone Encounter (Signed)
I spoke with the patient and she states that she is having extreme pain in back and sides and increased urinary frequency. Patient inquired if PCP can order imaging test for her as she states she does not want to be charged an "arm and leg" for a visit to  the emergency room.

## 2022-07-13 NOTE — Progress Notes (Signed)
Patient informed of the message and verbalized understanding 

## 2022-07-14 ENCOUNTER — Ambulatory Visit: Payer: No Typology Code available for payment source | Admitting: Family Medicine

## 2022-07-21 ENCOUNTER — Telehealth: Payer: Self-pay | Admitting: Family Medicine

## 2022-07-21 NOTE — Telephone Encounter (Signed)
Insurance needs PA for referral  [id number]

## 2022-07-21 NOTE — Telephone Encounter (Signed)
Noted  

## 2022-07-22 NOTE — Telephone Encounter (Signed)
Spoke with Kelly Woodward and she stated that [employer] contacted patient and patient changed address to [facility] instead of having it done at [facility]. I attempted to change it but since the patient wanted their service done there, it wasn't anything that I could do.  Contacted Melissa regarding this but she wasn't available. Left voicemail for her to call back regarding this matter.  Call ref: [id number]  Please advise.

## 2022-07-22 NOTE — Telephone Encounter (Signed)
Kelly Woodward is calling back and the approval is not for [facility]. Please call Kelly Woodward back

## 2022-07-23 ENCOUNTER — Telehealth: Payer: Self-pay | Admitting: Family Medicine

## 2022-07-23 NOTE — Telephone Encounter (Signed)
Tami called to see if Dr received a fax from [facility] for a cat scan with a contrast that needs to be done at [facility]. The fax was sent yesterday and [facility] stated they haven't received it yet.   Please advise.

## 2022-07-24 ENCOUNTER — Ambulatory Visit (HOSPITAL_BASED_OUTPATIENT_CLINIC_OR_DEPARTMENT_OTHER): Payer: Commercial Managed Care - HMO

## 2022-07-27 NOTE — Telephone Encounter (Signed)
Fax has not been received as of today

## 2022-07-30 ENCOUNTER — Telehealth: Payer: Self-pay

## 2022-07-30 DIAGNOSIS — R109 Unspecified abdominal pain: Secondary | ICD-10-CM

## 2022-07-30 NOTE — Telephone Encounter (Signed)
Received call from [employer] informed choice Tammy asking if orders for a CT w/o contrast of abd and pelvis to be faxed to [facility] imaging fax [phone]

## 2022-08-01 NOTE — Telephone Encounter (Signed)
CT order placed  Eulas Post MD [facility]

## 2022-08-01 NOTE — Addendum Note (Signed)
Addended by: Eulas Post on: 08/01/2022 09:03 PM   Modules accepted: Orders

## 2022-08-02 NOTE — Telephone Encounter (Signed)
Imaging orders faxed

## 2022-08-06 ENCOUNTER — Telehealth: Payer: Self-pay | Admitting: Family Medicine

## 2022-08-06 NOTE — Telephone Encounter (Signed)
FYI Pt is callIng to report her bp today at gyn office was 128/76.

## 2022-10-04 ENCOUNTER — Ambulatory Visit: Payer: Commercial Managed Care - HMO | Admitting: Family Medicine

## 2022-10-04 VITALS — BP 130/80 | HR 88 | Temp 97.7°F | Ht 69.0 in | Wt 168.1 lb

## 2022-10-04 DIAGNOSIS — R109 Unspecified abdominal pain: Secondary | ICD-10-CM

## 2022-10-04 LAB — POC URINALSYSI DIPSTICK (AUTOMATED)
Bilirubin, UA: NEGATIVE
Blood, UA: POSITIVE
Glucose, UA: NEGATIVE
Ketones, UA: NEGATIVE
Leukocytes, UA: NEGATIVE
Nitrite, UA: NEGATIVE
Protein, UA: NEGATIVE
Spec Grav, UA: 1.015 (ref 1.010–1.025)
Urobilinogen, UA: 0.2 E.U./dL
pH, UA: 6 (ref 5.0–8.0)

## 2022-10-04 MED ORDER — HYDROCODONE-ACETAMINOPHEN 5-325 MG PO TABS: 1.0000 | ORAL_TABLET | Freq: Four times a day (QID) | ORAL | 0 refills | Status: DC | PRN

## 2022-10-04 MED ORDER — TAMSULOSIN HCL 0.4 MG PO CAPS: 0.4000 mg | ORAL_CAPSULE | Freq: Every day | ORAL | 0 refills | Status: DC

## 2022-10-04 NOTE — Progress Notes (Signed)
Established Patient Office Visit  Subjective   Patient ID: Kelly Woodward, female    DOB: September 04, 1960  Age: 62 y.o. MRN: [id number]  Chief Complaint  Patient presents with   Back Pain    Patient complains of back pain,     HPI   Kelly Woodward is seen with onset this past Saturday of achy pain left flank region.  She denies any recent injury.  She has had kidney stones in the past way back in the 1990s and states these were calcium stones.  She denies any gross blood.  She describes more of a achy pain but worsening in severity past couple days.  Pain somewhat intermittent.  Radiates slightly anterior and inferiorly.  No fevers or chills.  She took Aleve without much improvement.  No significant alleviating factors.  Past Medical History:  Diagnosis Date   Allergy    Rhinitis   GERD (gastroesophageal reflux disease)    History of nephrolithiasis 1996   Osteopenia    Peptic ulcer    need to avoid ASA per pt.   Tobacco abuse    Vision abnormalities    Past Surgical History:  Procedure Laterality Date   cyst on vocal chord  1998    reports that she quit smoking about 5 years ago. Her smoking use included cigarettes. She has never used smokeless tobacco. She reports that she does not drink alcohol and does not use drugs. family history includes Cancer in her maternal grandfather and maternal grandmother; Cirrhosis in her father; Healthy in her brother; Hypertension in her mother; Melanoma in her daughter; Scoliosis in her father. Allergies  Allergen Reactions   Amoxicillin Anaphylaxis    Voice changes and feels like throat is swelling and rash   Astelin Other (See Comments)    nosebleeds    Review of Systems  Constitutional:  Negative for chills and fever.  Gastrointestinal:  Negative for nausea and vomiting.  Genitourinary:  Positive for flank pain. Negative for dysuria, frequency and hematuria.  Musculoskeletal:  Positive for back pain.      Objective:     BP 130/80 (BP  Location: Left Arm, Patient Position: Sitting, Cuff Size: Normal)   Pulse 88   Temp 97.7 F (36.5 C) (Oral)   Ht '5\' 9"'$  (1.753 m)   Wt 168 lb 1.6 oz (76.2 kg)   LMP 04/02/2018   SpO2 100%   BMI 24.82 kg/m  BP Readings from Last 3 Encounters:  10/04/22 130/80  07/06/22 (!) 154/86  10/26/21 120/74   Wt Readings from Last 3 Encounters:  10/04/22 168 lb 1.6 oz (76.2 kg)  07/06/22 173 lb 3.2 oz (78.6 kg)  10/26/21 161 lb 6.4 oz (73.2 kg)      Physical Exam Vitals reviewed.  Constitutional:      General: She is not in acute distress.    Appearance: She is not ill-appearing.  Cardiovascular:     Rate and Rhythm: Normal rate and regular rhythm.  Pulmonary:     Effort: Pulmonary effort is normal.     Breath sounds: Normal breath sounds.  Musculoskeletal:     Comments: Straight leg raises are negative bilaterally No spinal tenderness.  No reproducible flank tenderness.  Neurological:     Mental Status: She is alert.     Comments: Full strength lower extremities with symmetric lower extremity reflexes.      Results for orders placed or performed in visit on 10/04/22  POCT Urinalysis Dipstick (Automated)  Result Value Ref Range  Color, UA Yellow    Clarity, UA Clear    Glucose, UA Negative Negative   Bilirubin, UA Negative    Ketones, UA Negative    Spec Grav, UA 1.015 1.010 - 1.025   Blood, UA Positive    pH, UA 6.0 5.0 - 8.0   Protein, UA Negative Negative   Urobilinogen, UA 0.2 0.2 or 1.0 E.U./dL   Nitrite, UA Negative    Leukocytes, UA Negative Negative      The 10-year ASCVD risk score (Arnett DK, et al., 2019) is: 3.5%    Assessment & Plan:   Kelly Woodward presents with 2-day history of left flank pain.  We initially thought this may be more musculoskeletal but she does have some blood in her urine dipstick without leukocytes or nitrites.  Past history of reported calcium kidney stones years ago.  Suspect she may have recurrent stone now.  Her current pain is about  5 out of 10.  -Urine strainer provided -Start Flomax 0.4 mg 1 nightly -Hydrate very well -Wrote for limited pain medication with hydrocodone 5/325 mg 1-2 every 6 hours as needed for severe pain -If pain not fully resolved a couple days to be in touch and may need to consider imaging.   No follow-ups on file.    Carolann Littler, MD

## 2022-10-04 NOTE — Patient Instructions (Signed)
Hydrate well  Start the Flomax and take the pain medication as needed  Let me know if not improving in couple  of days.

## 2022-10-05 LAB — URINALYSIS, ROUTINE W REFLEX MICROSCOPIC
Bilirubin Urine: NEGATIVE
Hgb urine dipstick: NEGATIVE
Ketones, ur: NEGATIVE
Leukocytes,Ua: NEGATIVE
Nitrite: NEGATIVE
Specific Gravity, Urine: 1.025 (ref 1.000–1.030)
Total Protein, Urine: NEGATIVE
Urine Glucose: NEGATIVE
Urobilinogen, UA: 0.2 (ref 0.0–1.0)
WBC, UA: NONE SEEN (ref 0–?)
pH: 6 (ref 5.0–8.0)

## 2022-10-05 MED ORDER — METHOCARBAMOL 500 MG PO TABS: ORAL_TABLET | ORAL | 0 refills | Status: DC

## 2022-10-05 NOTE — Addendum Note (Signed)
Addended by: Nilda Riggs on: 10/05/2022 01:59 PM   Modules accepted: Orders

## 2022-10-11 ENCOUNTER — Other Ambulatory Visit: Payer: Self-pay | Admitting: Family Medicine

## 2023-03-15 ENCOUNTER — Other Ambulatory Visit: Payer: Self-pay | Admitting: Family Medicine

## 2023-03-15 MED ORDER — LORAZEPAM 0.5 MG PO TABS: 0.5000 mg | ORAL_TABLET | Freq: Every evening | ORAL | 0 refills | Status: DC | PRN

## 2023-03-15 NOTE — Telephone Encounter (Signed)
Prescription Request  03/15/2023  LOV: 10/04/2022  What is the name of the medication or equipment? LORazepam (ATIVAN) 0.5 MG tablet   Have you contacted your pharmacy to request a refill? No   Which pharmacy would you like this sent to?  [id number] [place],  - [address] [place] [address] Phone: [phone] Fax: [phone]    Patient notified that their request is being sent to the clinical staff for review and that they should receive a response within 2 business days.   Please advise at Mobile [phone] (mobile)

## 2023-03-23 ENCOUNTER — Other Ambulatory Visit: Payer: Self-pay | Admitting: Obstetrics and Gynecology

## 2023-03-29 ENCOUNTER — Other Ambulatory Visit: Payer: Self-pay

## 2023-03-29 ENCOUNTER — Encounter (HOSPITAL_BASED_OUTPATIENT_CLINIC_OR_DEPARTMENT_OTHER): Payer: Self-pay

## 2023-03-29 ENCOUNTER — Emergency Department (HOSPITAL_BASED_OUTPATIENT_CLINIC_OR_DEPARTMENT_OTHER)
Admission: EM | Admit: 2023-03-29 | Discharge: 2023-03-29 | Disposition: A | Discharge status: Home / Self Care | Payer: Commercial Managed Care - HMO | Attending: Emergency Medicine | Admitting: Emergency Medicine

## 2023-03-29 ENCOUNTER — Emergency Department (HOSPITAL_BASED_OUTPATIENT_CLINIC_OR_DEPARTMENT_OTHER): Payer: Commercial Managed Care - HMO

## 2023-03-29 DIAGNOSIS — Z87442 Personal history of urinary calculi: Secondary | ICD-10-CM | POA: Diagnosis not present

## 2023-03-29 DIAGNOSIS — R109 Unspecified abdominal pain: Secondary | ICD-10-CM

## 2023-03-29 DIAGNOSIS — K5730 Diverticulosis of large intestine without perforation or abscess without bleeding: Secondary | ICD-10-CM | POA: Diagnosis not present

## 2023-03-29 DIAGNOSIS — R1032 Left lower quadrant pain: Secondary | ICD-10-CM | POA: Diagnosis present

## 2023-03-29 LAB — CBC WITH DIFFERENTIAL/PLATELET
Abs Immature Granulocytes: 0.03 10*3/uL (ref 0.00–0.07)
Basophils Absolute: 0 10*3/uL (ref 0.0–0.1)
Basophils Relative: 0 %
Eosinophils Absolute: 0.1 10*3/uL (ref 0.0–0.5)
Eosinophils Relative: 1 %
HCT: 40.1 % (ref 36.0–46.0)
Hemoglobin: 13.4 g/dL (ref 12.0–15.0)
Immature Granulocytes: 0 %
Lymphocytes Relative: 20 %
Lymphs Abs: 1.3 10*3/uL (ref 0.7–4.0)
MCH: 30.6 pg (ref 26.0–34.0)
MCHC: 33.4 g/dL (ref 30.0–36.0)
MCV: 91.6 fL (ref 80.0–100.0)
Monocytes Absolute: 0.3 10*3/uL (ref 0.1–1.0)
Monocytes Relative: 5 %
Neutro Abs: 5 10*3/uL (ref 1.7–7.7)
Neutrophils Relative %: 74 %
Platelets: 335 10*3/uL (ref 150–400)
RBC: 4.38 MIL/uL (ref 3.87–5.11)
RDW: 13.1 % (ref 11.5–15.5)
WBC: 6.8 10*3/uL (ref 4.0–10.5)
nRBC: 0 % (ref 0.0–0.2)

## 2023-03-29 LAB — COMPREHENSIVE METABOLIC PANEL
ALT: 9 U/L (ref 0–44)
AST: 15 U/L (ref 15–41)
Albumin: 4.2 g/dL (ref 3.5–5.0)
Alkaline Phosphatase: 58 U/L (ref 38–126)
Anion gap: 9 (ref 5–15)
BUN: 14 mg/dL (ref 8–23)
CO2: 24 mmol/L (ref 22–32)
Calcium: 9 mg/dL (ref 8.9–10.3)
Chloride: 103 mmol/L (ref 98–111)
Creatinine, Ser: 0.58 mg/dL (ref 0.44–1.00)
GFR, Estimated: >60 mL/min (ref >60)
Glucose, Bld: 85 mg/dL (ref 70–99)
Potassium: 3.5 mmol/L (ref 3.5–5.1)
Sodium: 136 mmol/L (ref 135–145)
Total Bilirubin: 0.6 mg/dL (ref 0.3–1.2)
Total Protein: 7.2 g/dL (ref 6.5–8.1)

## 2023-03-29 LAB — URINALYSIS, ROUTINE W REFLEX MICROSCOPIC
Bacteria, UA: NONE SEEN
Bilirubin Urine: NEGATIVE
Glucose, UA: NEGATIVE mg/dL
Ketones, ur: NEGATIVE mg/dL
Leukocytes,Ua: NEGATIVE
Nitrite: NEGATIVE
Protein, ur: NEGATIVE mg/dL
Specific Gravity, Urine: 1.006 (ref 1.005–1.030)
pH: 7 (ref 5.0–8.0)

## 2023-03-29 LAB — LIPASE, BLOOD: Lipase: 10 U/L — ABNORMAL LOW (ref 11–51)

## 2023-03-29 MED ORDER — IOHEXOL 350 MG/ML SOLN
100.0000 mL | Freq: Once | INTRAVENOUS | Status: AC | PRN
  Administered 2023-03-29: 75 mL via INTRAVENOUS

## 2023-03-29 NOTE — ED Notes (Signed)
Discharge instructions given.  Pt verbalized understanding and had no further questions.

## 2023-03-29 NOTE — ED Provider Notes (Signed)
[place] EMERGENCY DEPARTMENT AT [facility] Provider Note   CSN: [id number] Arrival date & time: 03/29/23  1119     History  Chief Complaint  Patient presents with   Flank Pain    Kelly Woodward is a 62 y.o. female with history of kidney stones, GERD, peptic ulcer disease, osteopenia, Raynaud's syndrome, TIA, who presents to the emergency department complaining of left-sided flank and abdominal pain for several months.  Patient states she initially had some pain in her left flank back in December 2023.  Her doctor found that she had some microscopic blood as well as crystals in her urine, and suspected she could have had a kidney stone.  Patient states that she has a history of kidney stones when she was much younger, and the pain that she was having in December did not feel as bad as 1 of those.  The pain had gone away.  I did return 3 months later, and was associated with left lower back spasms.  She took a muscle relaxer and it improved.  Intermittently throughout this time she has been having some vaginal spotting.  She saw her OB/GYN who did a uterine biopsy and an ultrasound, she got the results this past week which were normal.  She continues to have pain on this left side, it feels worse right before she needs to have a bowel movement, and then it feels somewhat improved right after.  She denies any fever, chills, nausea, vomiting.  She has had some harder stools, social take a stool softener, and will improve afterwards.  She has no urinary symptoms.     Flank Pain Associated symptoms include abdominal pain.       Home Medications Prior to Admission medications   Medication Sig Start Date End Date Taking? Authorizing Provider  albuterol (PROVENTIL HFA;VENTOLIN HFA) 108 (90 Base) MCG/ACT inhaler Inhale 2 puffs into the lungs every 6 (six) hours as needed for wheezing or shortness of breath. 10/13/18   Koberlein, Paris Lore, MD  calcium carbonate (TUMS - DOSED IN MG  ELEMENTAL CALCIUM) 500 MG chewable tablet Chew 1 tablet by mouth daily as needed for indigestion or heartburn.    [provider]  cetirizine (ZYRTEC) 10 MG tablet Take 10 mg by mouth daily.      [provider]  Cobalamine Combinations (B-12) [phone] MCG SUBL B-12    [provider]  HYDROcodone-acetaminophen (NORCO/VICODIN) 5-325 MG tablet Take 1-2 tablets by mouth every 6 (six) hours as needed for moderate pain. 10/04/22   Burchette, Elberta Fortis, MD  LORazepam (ATIVAN) 0.5 MG tablet Take 1 tablet (0.5 mg total) by mouth at bedtime as needed. 03/15/23   Burchette, Elberta Fortis, MD  methocarbamol (ROBAXIN) 500 MG tablet Take 1 tablet every 8 hours as needed for muscle spasm 10/05/22   Burchette, Elberta Fortis, MD  Spacer/Aero-Holding Chambers (E-Z SPACER) inhaler Use as instructed 10/13/18   Wynn Banker, MD  tamsulosin (FLOMAX) 0.4 MG CAPS capsule Take 1 capsule (0.4 mg total) by mouth daily. 10/04/22   Burchette, Elberta Fortis, MD      Allergies    Amoxicillin and Astelin    Review of Systems   Review of Systems  Gastrointestinal:  Positive for abdominal pain.  Genitourinary:  Positive for flank pain.  All other systems reviewed and are negative.   Physical Exam Updated Vital Signs BP (!) 190/90 (BP Location: Right Arm)   Pulse 91   Temp 97.6 F (36.4 C) (Oral)  Resp 16   Ht 5\' 8"  (1.727 m)   Wt 77.1 kg   LMP 04/02/2018   SpO2 98%   BMI 25.85 kg/m  Physical Exam Vitals and nursing note reviewed.  Constitutional:      Appearance: Normal appearance.  HENT:     Head: Normocephalic and atraumatic.  Eyes:     Conjunctiva/sclera: Conjunctivae normal.  Cardiovascular:     Rate and Rhythm: Normal rate and regular rhythm.  Pulmonary:     Effort: Pulmonary effort is normal. No respiratory distress.     Breath sounds: Normal breath sounds.  Abdominal:     General: There is no distension.     Palpations: Abdomen is soft.     Tenderness: There is abdominal tenderness  in the left lower quadrant.  Musculoskeletal:     Comments: Left lower back tenderness to palpation, worse with rotational movement  Skin:    General: Skin is warm and dry.  Neurological:     General: No focal deficit present.     Mental Status: She is alert.     ED Results / Procedures / Treatments   Labs (all labs ordered are listed, but only abnormal results are displayed) Labs Reviewed  LIPASE, BLOOD - Abnormal; Notable for the following components:      Result Value   Lipase 10 (*)    All other components within normal limits  URINALYSIS, ROUTINE W REFLEX MICROSCOPIC - Abnormal; Notable for the following components:   Hgb urine dipstick TRACE (*)    All other components within normal limits  CBC WITH DIFFERENTIAL/PLATELET  COMPREHENSIVE METABOLIC PANEL    EKG None  Radiology CT ABDOMEN PELVIS W CONTRAST  Result Date: 03/29/2023 CLINICAL DATA:  Left flank pain for several months.  Constipation. EXAM: CT ABDOMEN AND PELVIS WITH CONTRAST TECHNIQUE: Multidetector CT imaging of the abdomen and pelvis was performed using the standard protocol following bolus administration of intravenous contrast. RADIATION DOSE REDUCTION: This exam was performed according to the departmental dose-optimization program which includes automated exposure control, adjustment of the mA and/or kV according to patient size and/or use of iterative reconstruction technique. CONTRAST:  75mL OMNIPAQUE IOHEXOL 350 MG/ML SOLN COMPARISON:  02/13/2015 FINDINGS: Lower Chest: No acute findings. Hepatobiliary: No suspicious hepatic masses identified. Gallbladder is unremarkable. No evidence of biliary ductal dilatation. Pancreas:  No mass or inflammatory changes. Spleen: Within normal limits in size and appearance. Adrenals/Urinary Tract: No suspicious masses identified. No evidence of ureteral calculi or hydronephrosis. Stomach/Bowel: No evidence of obstruction, inflammatory process or abnormal fluid collections.  Diverticulosis is seen mainly involving the sigmoid colon, however there is no evidence of diverticulitis. Normal appendix visualized. Vascular/Lymphatic: No pathologically enlarged lymph nodes. No acute vascular findings. Reproductive:  No mass or other significant abnormality. Other:  None. Musculoskeletal:  No suspicious bone lesions identified. IMPRESSION: Colonic diverticulosis, without radiographic evidence of diverticulitis or other acute findings. Electronically Signed   By: Danae Orleans M.D.   On: 03/29/2023 18:32    Procedures Procedures    Medications Ordered in ED Medications  iohexol (OMNIPAQUE) 350 MG/ML injection 100 mL (75 mLs Intravenous Contrast Given 03/29/23 1654)    ED Course/ Medical Decision Making/ A&P                                 Medical Decision Making  This patient is a 62 y.o. female  who presents to the ED for concern of abdominal  and flank pain x 10 months.   Differential diagnoses prior to evaluation: The emergent differential diagnosis includes, but is not limited to,  AAA, renal artery/vein embolism/thrombosis, mesenteric ischemia, pyelonephritis, nephrolithiasis, cystitis, biliary colic, pancreatitis, perforated peptic ulcer, appendicitis, diverticulitis, bowel obstruction, GU malignancy. This is not an exhaustive differential.   Past Medical History / Co-morbidities / Social History: kidney stones, GERD, peptic ulcer disease, osteopenia, Raynaud's syndrome, TIA  Physical Exam: Physical exam performed. The pertinent findings include: Mildly hypertensive, otherwise normal vital signs.  No acute distress.  Left lower back tenderness, rating to the left lower quadrant with associated tenderness as well.  No guarding or rebound.  Lab Tests/Imaging studies: I personally interpreted labs/imaging and the pertinent results include: No leukocytosis, normal hemoglobin.  CMP unremarkable.  Trace hemoglobin on urinalysis, but negative for infection.  Normal  lipase.  CT abdomen pelvis shows colonic diverticulosis, mainly in the sigmoid colon. I agree with the radiologist interpretation.  Disposition: After consideration of the diagnostic results and the patients response to treatment, I feel that emergency department workup does not suggest an emergent condition requiring admission or immediate intervention beyond what has been performed at this time. The plan is: Discharge to home with reassurance and symptomatic management of abdominal and flank pain.  No emergent etiology found for symptoms today.  CT scan with diverticulosis, communicated these results to the patient and gave her close return precautions.  She is comfortable with this plan.. The patient is safe for discharge and has been instructed to return immediately for worsening symptoms, change in symptoms or any other concerns.  Final Clinical Impression(s) / ED Diagnoses Final diagnoses:  Left sided abdominal pain  Diverticulosis of sigmoid colon    Rx / DC Orders ED Discharge Orders     None      Portions of this report may have been transcribed using voice recognition software. Every effort was made to ensure accuracy; however, inadvertent computerized transcription errors may be present.    Su Monks, PA-C 03/29/23 1908    Elayne Snare K, DO 03/29/23 2337

## 2023-03-29 NOTE — ED Provider Triage Note (Signed)
Emergency Medicine Provider Triage Evaluation Note  Kelly Woodward , a 62 y.o. female  was evaluated in triage.  Pt complains of LUQ/LLQ pain since December 2023. Pain comes and goes but says its better with BM. Patient reports some constipation and needs laxative. Seen GYN recently and been cleared due to vaginal spotting. Hx of stones but states this feels different. Denies CP, SHOB, NV, fevers.  Review of Systems  Positive: See HPI Negative: See HPI  Physical Exam  BP (!) 154/105   Pulse 89   Temp 98.8 F (37.1 C)   Resp 18   Ht 5\' 8"  (1.727 m)   Wt 77.1 kg   LMP 04/02/2018   SpO2 100%   BMI 25.85 kg/m  Gen:   Awake, no distress   Resp:  Normal effort  MSK:   Moves extremities without difficulty  Other:  Walks with slight limp to right leg, tender to palpation to b/l paralumbar muscles, tender to LUQ/LLQ w/o peritoneal signs, left CVA tenderness  Medical Decision Making  Medically screening exam initiated at 3:29 PM.  Appropriate orders placed.  Kelly Woodward was informed that the remainder of the evaluation will be completed by another provider, this initial triage assessment does not replace that evaluation, and the importance of remaining in the ED until their evaluation is complete.  Workup started, favor intra-abdominal process due to symptoms getting better with BM and reported constipation with abd tenderness vs nephrolithiasis   Netta Corrigan, PA-C 03/29/23 1534

## 2023-03-29 NOTE — Discharge Instructions (Signed)
You were seen in the emergency department for left-sided flank and abdominal pain.  As we discussed your blood work looked reassuring.  Your CT scan showed evidence of diverticulosis, without acute infection or inflammation.  I suspect this could be contributing to your symptoms.  I have attached some recommendations about this.  Please follow-up with your primary doctor.   Continue to monitor how you're doing and return to the ER for new or worsening symptoms such as fever, worsening or persistent pain, vomiting, diarrhea.

## 2023-03-29 NOTE — ED Triage Notes (Signed)
Pt arrives ambulatory to ED with c/o of 'weird symptoms since December states that she has had left flank pain that she initially thought was a kidney stone, never passed on, also had vaginal spotting was seen and cleared by GYN for this. Has now started having left sided back and flank pain with some pain to left leg. Also c/o 'trouble passing bowels' states back pain is worse when she has to have a BM.

## 2023-04-28 ENCOUNTER — Ambulatory Visit: Payer: Managed Care, Other (non HMO) | Admitting: Family Medicine

## 2023-04-28 ENCOUNTER — Encounter: Payer: Self-pay | Admitting: Family Medicine

## 2023-04-28 VITALS — BP 120/86 | HR 96 | Temp 98.7°F | Ht 68.0 in | Wt 166.4 lb

## 2023-04-28 DIAGNOSIS — J3089 Other allergic rhinitis: Secondary | ICD-10-CM | POA: Diagnosis not present

## 2023-04-28 DIAGNOSIS — R051 Acute cough: Secondary | ICD-10-CM | POA: Diagnosis not present

## 2023-04-28 DIAGNOSIS — J45901 Unspecified asthma with (acute) exacerbation: Secondary | ICD-10-CM | POA: Diagnosis not present

## 2023-04-28 MED ORDER — PREDNISONE 10 MG PO TABS
ORAL_TABLET | ORAL | 0 refills | Status: DC
Start: 2023-04-28 — End: 2023-07-26

## 2023-04-28 MED ORDER — IPRATROPIUM-ALBUTEROL 0.5-2.5 (3) MG/3ML IN SOLN
3.0000 mL | Freq: Once | RESPIRATORY_TRACT | Status: AC
Start: 2023-04-28 — End: 2023-04-28
  Administered 2023-04-28: 3 mL via RESPIRATORY_TRACT

## 2023-04-28 MED ORDER — ALBUTEROL SULFATE HFA 108 (90 BASE) MCG/ACT IN AERS: 2.0000 | INHALATION_SPRAY | Freq: Four times a day (QID) | RESPIRATORY_TRACT | 1 refills | Status: AC | PRN

## 2023-04-28 NOTE — Progress Notes (Signed)
Established Patient Office Visit   Subjective  Patient ID: Kelly Woodward, female    DOB: 1961-05-24  Age: 62 y.o. MRN: [id number]  No chief complaint on file.   Patient is a 62 year old female followed by Dr. Caryl Never and seen for ongoing concern.  Patient endorses history of asthma diagnosed as in her 110s and seasonal/environmental allergies.  Patient states asthma symptoms have become more frequent since having COVID x 3.  No recent formal PFTs noted. Patient endorses increased cough, wheezing x 3 weeks after visiting her mother in [place] who has 2 cats.  Patient states the ragweed in the area also made symptoms worse.  Patient using rescue inhaler a few times per day.  Restarted Zyrtec 1 month ago.  States unable to use nasal sprays due to history of vocal cord surgery.  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  Concerned about prednisone as typically causes insomnia.  Patient has to go back to [place] on Monday as her mother was recently diagnosed with cancer.    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   Tobacco abuse     Vision abnormalities    Past Surgical History:  Procedure Laterality Date   cyst on vocal chord  1998   Social History   Tobacco Use   Smoking status: Former    Current packs/day: 0.00    Types: Cigarettes    Quit date: 11/05/2016    Years since quitting: 6.4   Smokeless tobacco: Never  Vaping Use   Vaping status: Never Used  Substance Use Topics   Alcohol use: No    Alcohol/week: 0.0 standard drinks of alcohol    Comment: rare   Drug use: No   Family History  Problem Relation Age of Onset   Hypertension Mother    Scoliosis Father    Cirrhosis Father        hep B   Healthy Brother    Melanoma Daughter    Cancer Maternal Grandmother        colon cancer   Cancer Maternal Grandfather        glioblastoma   Allergies  Allergen Reactions   Amoxicillin Anaphylaxis    Voice changes and feels like throat is swelling and rash   Other Anaphylaxis   Astelin Other (See Comments)      ROS Negative unless stated above    Objective:     BP 120/86 (BP Location: Left Arm, Patient Position: Sitting, Cuff Size: Normal)   Pulse 96   Temp 98.7 F (37.1 C) (Oral)   Ht 5'  8" (1.727 m)   Wt 166 lb 6.4 oz (75.5 kg)   LMP 04/02/2018   SpO2 97%   BMI 25.30 kg/m  BP Readings from Last 3 Encounters:  04/28/23 120/86  03/29/23 136/81  10/04/22 130/80   Wt Readings from Last 3 Encounters:  04/28/23 166 lb 6.4 oz (75.5 kg)  03/29/23 170 lb (77.1 kg)  10/04/22 168 lb 1.6 oz (76.2 kg)      Physical Exam Constitutional:      General: She is not in acute distress.    Appearance: Normal appearance.  HENT:     Head: Normocephalic and atraumatic.     Nose: Nose normal.     Mouth/Throat:     Mouth: Mucous membranes are moist.  Cardiovascular:     Rate and Rhythm: Normal rate and regular rhythm.     Heart sounds: Normal heart sounds. No murmur heard.    No gallop.  Pulmonary:     Effort: Pulmonary effort is normal. No respiratory distress.     Breath sounds:  Examination of the right-lower field reveals wheezing. Examination of the left-lower field reveals wheezing. Wheezing present. No rhonchi or rales.     Comments: Intermittent coughing.  Faint wheezing in bilateral bases.  CTAB s/p DuoNeb. Skin:    General: Skin is warm and dry.  Neurological:     Mental Status: She is alert and oriented to person, place, and time.    No results found for any visits on 04/28/23.    Assessment & Plan:  Acute cough -     Ipratropium-Albuterol -     predniSONE; Take 3 tabs on day 1, 2 tabs on day 2, 2 tabs on day 3, 1 tab on day 4.  Dispense: 8 tablet; Refill: 0  Reactive airway disease with acute exacerbation, unspecified asthma severity, unspecified whether persistent -     predniSONE; Take 3 tabs on day 1, 2 tabs on day 2, 2 tabs on day 3, 1 tab on day 4.  Dispense: 8 tablet; Refill: 0 -     Albuterol Sulfate HFA; Inhale 2 puffs into the lungs every 6 (six) hours as needed for wheezing or shortness of breath.  Dispense: 1 each; Refill: 1  Environmental and seasonal allergies  Patient seen for ongoing cough and wheezing x 3 weeks likely worsened by allergy to cats and seasonal allergies.  Reports h/o asthma though no mentioned upon chart review.  DuoNeb treatment in clinic.  Improvement in cough, lungs CTAB after treatment.  Rx for very low-dose prednisone taper sent to pharmacy.  Consider switching antihistamine from Zyrtec to Xyzal.  Consider referral to pulmonology for formal PFTs.  Given strict precautions.  Return if symptoms worsen or fail to improve.   Deeann Saint, MD

## 2023-06-13 ENCOUNTER — Encounter: Payer: Self-pay | Admitting: Nurse Practitioner

## 2023-06-13 ENCOUNTER — Ambulatory Visit: Payer: Managed Care, Other (non HMO) | Admitting: Nurse Practitioner

## 2023-06-13 VITALS — BP 130/82 | HR 94 | Temp 98.7°F | Resp 18 | Wt 170.0 lb

## 2023-06-13 DIAGNOSIS — N3000 Acute cystitis without hematuria: Secondary | ICD-10-CM | POA: Diagnosis not present

## 2023-06-13 DIAGNOSIS — R300 Dysuria: Secondary | ICD-10-CM

## 2023-06-13 LAB — POCT URINALYSIS DIPSTICK
Bilirubin, UA: NEGATIVE
Blood, UA: NEGATIVE
Glucose, UA: NEGATIVE
Ketones, UA: NEGATIVE
Leukocytes, UA: NEGATIVE
Nitrite, UA: NEGATIVE
Odor: NORMAL
Protein, UA: NEGATIVE
Spec Grav, UA: 1.015 (ref 1.010–1.025)
Urobilinogen, UA: 0.2 U/dL — AB
pH, UA: 7 (ref 5.0–8.0)

## 2023-06-13 MED ORDER — NITROFURANTOIN MONOHYD MACRO 100 MG PO CAPS
100.0000 mg | ORAL_CAPSULE | Freq: Two times a day (BID) | ORAL | 0 refills | Status: DC
Start: 2023-06-13 — End: 2023-07-26

## 2023-06-13 NOTE — Patient Instructions (Signed)
Maintain adequate oral hydration Urine sent for culture.

## 2023-06-13 NOTE — Progress Notes (Signed)
Acute Office Visit  Subjective:    Patient ID: Kelly Woodward, female    DOB: 1960-12-02, 62 y.o.   MRN: [id number]  Chief Complaint  Patient presents with   Acute Visit   Urinary Tract Infection    Symptoms began last night with frequent urination with burning sensation. No odor present or cloudiness. PT is due for cervical screening, and vaccinations   Urinary Tract Infection  This is a new problem. The current episode started yesterday. The problem occurs every urination. The problem has been unchanged. The quality of the pain is described as burning. There has been no fever. She is Not sexually active. There is No history of pyelonephritis. Associated symptoms include frequency. Pertinent negatives include no chills, discharge, flank pain, hematuria, hesitancy, nausea, possible pregnancy, sweats, urgency or vomiting. She has tried nothing for the symptoms. There is no history of catheterization, kidney stones, recurrent UTIs, a single kidney, urinary stasis or a urological procedure.  No constipation or diarrhea. Reports hx of atrophic vaginitis. Current use of estradiol cream and clobetasol prn. No oral abx in last 3months, no vaginal abnormal discharge, no odor  Outpatient Medications Prior to Visit  Medication Sig   albuterol (VENTOLIN HFA) 108 (90 Base) MCG/ACT inhaler Inhale 2 puffs into the lungs every 6 (six) hours as needed for wheezing or shortness of breath.   calcium carbonate (TUMS - DOSED IN MG ELEMENTAL CALCIUM) 500 MG chewable tablet Chew 1 tablet by mouth daily as needed for indigestion or heartburn.   cetirizine (ZYRTEC) 10 MG tablet Take 10 mg by mouth daily.     Cobalamine Combinations (B-12) [phone] MCG SUBL B-12   estradiol (ESTRACE) 0.1 MG/GM vaginal cream INSERT 1GRAM EVERY DAY BY VAGINAL ROUTE   estradiol (VIVELLE-DOT) 0.05 MG/24HR patch Place 1 patch onto the skin 2 (two) times a week.   LORazepam (ATIVAN) 0.5 MG tablet Take 1 tablet (0.5 mg total) by mouth  at bedtime as needed.   progesterone (PROMETRIUM) 100 MG capsule Take 100 mg by mouth at bedtime.   HYDROcodone-acetaminophen (NORCO/VICODIN) 5-325 MG tablet Take 1-2 tablets by mouth every 6 (six) hours as needed for moderate pain. (Patient not taking: Reported on 06/13/2023)   methocarbamol (ROBAXIN) 500 MG tablet Take 1 tablet every 8 hours as needed for muscle spasm (Patient not taking: Reported on 06/13/2023)   predniSONE (DELTASONE) 10 MG tablet Take 3 tabs on day 1, 2 tabs on day 2, 2 tabs on day 3, 1 tab on day 4. (Patient not taking: Reported on 06/13/2023)   Spacer/Aero-Holding Chambers (E-Z SPACER) inhaler Use as instructed (Patient not taking: Reported on 06/13/2023)   No facility-administered medications prior to visit.   Reviewed past medical and social history.  Review of Systems  Constitutional:  Negative for chills.  Gastrointestinal:  Negative for nausea and vomiting.  Genitourinary:  Positive for frequency. Negative for flank pain, hematuria, hesitancy and urgency.   Per HPI     Objective:    Physical Exam Vitals and nursing note reviewed.  Cardiovascular:     Rate and Rhythm: Normal rate.     Pulses: Normal pulses.  Pulmonary:     Effort: Pulmonary effort is normal.  Abdominal:     General: Bowel sounds are normal. There is no distension.     Palpations: Abdomen is soft.     Tenderness: There is no abdominal tenderness. There is no right CVA tenderness, left CVA tenderness or guarding.  Neurological:     Mental Status:  She is oriented to person, place, and time.    BP 130/82 (BP Location: Left Arm, Patient Position: Sitting, Cuff Size: Large)   Pulse 94   Temp 98.7 F (37.1 C) (Temporal)   Resp 18   Wt 170 lb (77.1 kg)   LMP 04/02/2018   SpO2 100%   BMI 25.85 kg/m    Results for orders placed or performed in visit on 06/13/23  POCT urinalysis dipstick  Result Value Ref Range   Color, UA yellow    Clarity, UA clear    Glucose, UA Negative Negative    Bilirubin, UA negative    Ketones, UA negative    Spec Grav, UA 1.015 1.010 - 1.025   Blood, UA negative    pH, UA 7.0 5.0 - 8.0   Protein, UA Negative Negative   Urobilinogen, UA 0.2 (A) 0.2 or 1.0 E.U./dL   Nitrite, UA negative    Leukocytes, UA Negative Negative   Appearance clear    Odor normal       Assessment & Plan:   Problem List Items Addressed This Visit   None Visit Diagnoses     Dysuria    -  Primary   Relevant Medications   nitrofurantoin, macrocrystal-monohydrate, (MACROBID) 100 MG capsule   Other Relevant Orders   POCT urinalysis dipstick (Completed)   Urine Culture        Meds ordered this encounter  Medications   nitrofurantoin, macrocrystal-monohydrate, (MACROBID) 100 MG capsule    Sig: Take 1 capsule (100 mg total) by mouth 2 (two) times daily.    Dispense:  6 capsule    Refill:  0    Order Specific Question:   Supervising Provider    Answer:   Mliss Sax [[id number]]   Return if symptoms worsen or fail to improve.    Alysia Penna, NP

## 2023-06-15 ENCOUNTER — Telehealth: Payer: Self-pay | Admitting: Family Medicine

## 2023-06-15 LAB — URINE CULTURE
MICRO NUMBER:: 15743905
SPECIMEN QUALITY:: ADEQUATE

## 2023-06-15 MED ORDER — AZITHROMYCIN 250 MG PO TABS
250.0000 mg | ORAL_TABLET | Freq: Every day | ORAL | 0 refills | Status: DC
Start: 2023-06-15 — End: 2023-07-26

## 2023-06-15 NOTE — Addendum Note (Signed)
Addended by: Alysia Penna L on: 06/15/2023 03:04 PM   Modules accepted: Orders

## 2023-06-15 NOTE — Telephone Encounter (Signed)
Pt called and said she need a antibiotic for her infection ( pt also said se is ALLERGIC to PENNICILIN . Pt said she is in [place] at this time and need the meds sent to [address] [place], [place] [address]  < 1 mi [phone] [email or web address] Open  Closes 10 PM  Please give the a call

## 2023-07-26 ENCOUNTER — Ambulatory Visit (INDEPENDENT_AMBULATORY_CARE_PROVIDER_SITE_OTHER): Payer: Commercial Managed Care - HMO | Admitting: Family Medicine

## 2023-07-26 ENCOUNTER — Encounter: Payer: Self-pay | Admitting: Family Medicine

## 2023-07-26 VITALS — BP 138/82 | HR 96 | Temp 97.5°F | Ht 68.0 in | Wt 171.2 lb

## 2023-07-26 DIAGNOSIS — N958 Other specified menopausal and perimenopausal disorders: Secondary | ICD-10-CM | POA: Insufficient documentation

## 2023-07-26 DIAGNOSIS — K5790 Diverticulosis of intestine, part unspecified, without perforation or abscess without bleeding: Secondary | ICD-10-CM | POA: Insufficient documentation

## 2023-07-26 DIAGNOSIS — R300 Dysuria: Secondary | ICD-10-CM

## 2023-07-26 LAB — POCT URINALYSIS DIPSTICK
Bilirubin, UA: NEGATIVE
Blood, UA: NEGATIVE
Glucose, UA: NEGATIVE
Ketones, UA: NEGATIVE
Leukocytes, UA: NEGATIVE
Nitrite, UA: NEGATIVE
Protein, UA: NEGATIVE
Spec Grav, UA: 1.025 (ref 1.010–1.025)
Urobilinogen, UA: 0.2 U/dL
pH, UA: 6 (ref 5.0–8.0)

## 2023-07-26 NOTE — Assessment & Plan Note (Signed)
Discussed that this is a common finding for older adults. There is no sign on exam today of diverticulitis. I recommend she take a daily fiber supplement to reduce risk for future infections.

## 2023-07-26 NOTE — Progress Notes (Signed)
 [facility] [address] [place] [address] Dept: [phone] Dept Fax: [phone]  Office Visit  Subjective:    Patient ID: Kelly Woodward, female    DOB: 27-Jun-1961, 62 y.o..   MRN: [id number]  Chief Complaint  Patient presents with   Dysuria    C/o having pain with urination.      History of Present Illness:  Patient is in today with a 4-day history of dysuria. She is not running fever. She has had some mild constipation. She denies any hematochezia. Kelly Woodward notes having a bladder infection in Nov. for which she was prescribed azithromycin . She also has a history of diverticulosis, prior kidney stones, and is currently being managed by her GYN related to postmenopausal vaginal bleeding. Kelly Woodward mother passed away recently from rapidly progressive non-small cell lung cancer. She admits that she has been suppressing her grief, but is now startign to experience this.  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   Seasonal and perennial allergic rhinitis 01/09/2007   Severe pre-eclampsia 01/09/2007   PRE-EC/ECLAMPSIA ON PRE-X HTN, ANTEPARTUM 01/09/2007   NEPHROLITHIASIS, HX OF 01/09/2007   Past Surgical History:  Procedure Laterality Date   cyst on vocal chord  1998   Family History  Problem Relation Age of Onset   Hypertension Mother    Scoliosis Father     Cirrhosis Father        hep B   Healthy Brother    Melanoma Daughter    Cancer Maternal Grandmother        colon cancer   Cancer Maternal Grandfather        glioblastoma   Outpatient Medications Prior to Visit  Medication Sig Dispense Refill   albuterol  (VENTOLIN  HFA) 108 (90 Base) MCG/ACT inhaler Inhale 2 puffs into the lungs every 6 (six) hours as needed for wheezing or shortness of breath. 1 each 1   calcium  carbonate (TUMS - DOSED IN MG ELEMENTAL CALCIUM ) 500 MG chewable tablet Chew 1 tablet by mouth daily as needed for indigestion or heartburn.     cetirizine (ZYRTEC) 10 MG tablet Take 10 mg by mouth daily.       Cobalamine Combinations (B-12) [phone] MCG SUBL B-12     estradiol (ESTRACE) 0.1 MG/GM vaginal cream INSERT 1GRAM EVERY DAY BY VAGINAL ROUTE     estradiol (VIVELLE-DOT) 0.05 MG/24HR patch Place 1 patch onto the skin 2 (two) times a week.     LORazepam  (ATIVAN ) 0.5 MG tablet Take 1 tablet (0.5 mg total) by mouth at bedtime as needed. 30 tablet 0   progesterone (PROMETRIUM) 100 MG capsule Take 100 mg by mouth at bedtime.     Spacer/Aero-Holding Chambers (E-Z SPACER) inhaler Use as instructed 1 each 2   azithromycin  (ZITHROMAX  Z-PAK) 250 MG tablet Take 1 tablet (250 mg total) by mouth daily. Take 2tabs on first day, then 1tab once a day till complete 6 tablet 0  HYDROcodone -acetaminophen  (NORCO/VICODIN) 5-325 MG tablet Take 1-2 tablets by mouth every 6 (six) hours as needed for moderate pain. (Patient not taking: Reported on 06/13/2023) 20 tablet 0   methocarbamol  (ROBAXIN ) 500 MG tablet Take 1 tablet every 8 hours as needed for muscle spasm (Patient not taking: Reported on 06/13/2023) 30 tablet 0   nitrofurantoin , macrocrystal-monohydrate, (MACROBID ) 100 MG capsule Take 1 capsule (100 mg total) by mouth 2 (two) times daily. 6 capsule 0   predniSONE  (DELTASONE ) 10 MG tablet Take 3 tabs on day 1, 2 tabs on day 2, 2 tabs on day 3, 1 tab on day 4. (Patient not taking: Reported on  06/13/2023) 8 tablet 0   No facility-administered medications prior to visit.   Allergies  Allergen Reactions   Amoxicillin  Anaphylaxis    Voice changes and feels like throat is swelling and rash   Other Anaphylaxis   Astelin Other (See Comments)     Objective:   Today's Vitals   07/26/23 0853  BP: 138/82  Pulse: 96  Temp: (!) 97.5 F (36.4 C)  TempSrc: Temporal  SpO2: 99%  Weight: 171 lb 3.2 oz (77.7 kg)  Height: 5' 8 (1.727 m)   Body mass index is 26.03 kg/m.   General: Well developed, well nourished. No acute distress. Abdomen: Soft, non-tender. Bowel sounds positive, normal pitch and frequency. No   hepatosplenomegaly. No rebound or guarding. Back: Straight. No CVA tenderness bilaterally, but some mild back discomfort. Psych: Alert and oriented. Normal mood and affect.  Health Maintenance Due  Topic Date Due   HIV Screening  Never done   Zoster Vaccines- Shingrix (1 of 2) Never done   Cervical Cancer Screening (HPV/Pap Cotest)  12/04/2022   MAMMOGRAM  07/17/2023   Lab Results Component Ref Range & Units (hover) 09:12 (07/26/23)  Color, UA yellow  Clarity, UA clear  Glucose, UA Negative  Bilirubin, UA neg  Ketones, UA neg  Spec Grav, UA 1.025  Blood, UA neg  pH, UA 6.0  Protein, UA Negative  Urobilinogen, UA 0.2  Nitrite, UA neg  Leukocytes, UA Negative   Imaging: CT Abdomen and Pelvis w constrast (03/29/2023) IMPRESSION: Colonic diverticulosis, without radiographic evidence of diverticulitis or other acute findings.    Assessment & Plan:   Problem List Items Addressed This Visit       Digestive   Diverticulosis   Discussed that this is a common finding for older adults. There is no sign on exam today of diverticulitis. I recommend she take a daily fiber supplement to reduce risk for future infections.        Genitourinary   Genitourinary syndrome of menopause   I reviewed the prior UA and urine culture from Nov. Like today, the UA had  shown a neg. leukocyte esterase, neg. nitrite, and no red blood. I suspect the positive urine culture may have represented asymptomatic bacteruria rather than acute cystitis. This likely is related to GUSM. I discussed this diagnosis with Kelly Woodward. She admits to having vaginal estrogen cream at home, but had not been using this. I recommend she use this daily for 1 week and then start twice weekly. She should follow-up with her GYN regarding these symptoms and her ongoing vaginal bleeding.       Other Visit Diagnoses       Dysuria    -  Primary   No sign of infection today. Discussed that prior positive urine culture may have represented ASB int he absence of pyuria and nitrates.  Relevant Orders   POCT Urinalysis Dipstick (Completed)       Return if symptoms worsen or fail to improve.   Garnette CHRISTELLA Simpler, MD

## 2023-07-26 NOTE — Patient Instructions (Signed)
Use vaginal estrogen cream daily for 7 days, then twice weekly. Push fluids. Take a daily fiber supplement.

## 2023-07-26 NOTE — Assessment & Plan Note (Signed)
 I reviewed the prior UA and urine culture from Nov. Like today, the UA had shown a neg. leukocyte esterase, neg. nitrite, and no red blood. I suspect the positive urine culture may have represented asymptomatic bacteruria rather than acute cystitis. This likely is related to GUSM. I discussed this diagnosis with Ms. Behunin. She admits to having vaginal estrogen cream at home, but had not been using this. I recommend she use this daily for 1 week and then start twice weekly. She should follow-up with her GYN regarding these symptoms and her ongoing vaginal bleeding.

## 2023-07-29 ENCOUNTER — Ambulatory Visit: Payer: Commercial Managed Care - HMO | Admitting: Family Medicine

## 2023-07-29 ENCOUNTER — Telehealth: Payer: Self-pay | Admitting: Genetic Counselor

## 2023-07-29 ENCOUNTER — Ambulatory Visit: Payer: Commercial Managed Care - HMO

## 2023-07-29 VITALS — BP 120/70 | HR 85 | Temp 98.1°F | Ht 68.0 in | Wt 171.0 lb

## 2023-07-29 DIAGNOSIS — G8929 Other chronic pain: Secondary | ICD-10-CM | POA: Diagnosis not present

## 2023-07-29 DIAGNOSIS — J4540 Moderate persistent asthma, uncomplicated: Secondary | ICD-10-CM | POA: Insufficient documentation

## 2023-07-29 DIAGNOSIS — M545 Low back pain, unspecified: Secondary | ICD-10-CM

## 2023-07-29 DIAGNOSIS — Z801 Family history of malignant neoplasm of trachea, bronchus and lung: Secondary | ICD-10-CM | POA: Diagnosis not present

## 2023-07-29 MED ORDER — LORAZEPAM 0.5 MG PO TABS: 0.5000 mg | ORAL_TABLET | Freq: Every evening | ORAL | 0 refills | Status: DC | PRN

## 2023-07-29 MED ORDER — BUDESONIDE-FORMOTEROL FUMARATE 80-4.5 MCG/ACT IN AERO: 2.0000 | INHALATION_SPRAY | Freq: Two times a day (BID) | RESPIRATORY_TRACT | 11 refills | Status: DC

## 2023-07-29 NOTE — Progress Notes (Signed)
 Established Patient Office Visit  Subjective   Patient ID: Kelly Woodward, female    DOB: 1961/07/21  Age: 63 y.o. MRN: [id number]  Chief Complaint  Patient presents with   Medical Management of Chronic Issues    Back pain (left lower) seen in ED had a CT, rate of pain 6 out of 10, Asthma - patient would like a Pulmonology referral     HPI   Kelly Woodward is seen today to discuss and follow-up multiple items as follows  Very stressful past year.  Her mom died somewhat unexpectedly at age 8 back in September.  They had just gone to [place] and her mom was very active and generally in good health.  She had some imaging done when she got back and had metastatic cancer and eventually it was determined she had non-small cell lung cancer (EGFR +) with a very aggressive type.  Patient was instructed to get genetic counseling and possible testing.  Her mom never smoked.  She also lost a dog during the past year.  Very stressful year overall.  She relates increased wheezing especially over the past year.  She has always had some mild intermittent asthma with infrequent wheezing treated with rescue inhaler in the past.  Over the past several months particularly she has had increased wheezing with frequency 2-3 times per week or more.  This has definitely become more persistent.  She has noted specific triggers such as cats and dairy product consumption.  She wonders whether she may need to see pulmonary.  Has never been on any controlling medications.  She has smoked lightly in the past but quit 2018.  Other issue is chronic intermittent low back pain somewhat diffuse but more left-sided.  No radiculitis symptoms.  She has had some pain rating toward the left lower abdomen.  Had CT abdomen pelvis through the ER back in September which was unremarkable.  No kidney stones.  She was told by someone that she had scoliosis changes of her spine and she would like to get some plain films.  No recent back imaging.   Denies any recent fall or specific trauma.  Back pain is intermittent but most days.  Past Medical History:  Diagnosis Date   Allergy     Rhinitis   GERD (gastroesophageal reflux disease)    History of nephrolithiasis 1996   Osteopenia    Peptic ulcer    need to avoid ASA per pt.   Tobacco abuse    Vision abnormalities    Past Surgical History:  Procedure Laterality Date   cyst on vocal chord  1998    reports that she quit smoking about 6 years ago. Her smoking use included cigarettes. She has never used smokeless tobacco. She reports that she does not drink alcohol and does not use drugs. family history includes Cancer in her maternal grandfather and maternal grandmother; Cirrhosis in her father; Healthy in her brother; Hypertension in her mother; Melanoma in her daughter; Scoliosis in her father. Allergies  Allergen Reactions   Amoxicillin  Anaphylaxis    Voice changes and feels like throat is swelling and rash   Other Anaphylaxis   Astelin Other (See Comments)    Review of Systems  Constitutional:  Negative for chills, fever and weight loss.  Respiratory:  Positive for cough and wheezing.   Cardiovascular:  Negative for chest pain.  Genitourinary:  Negative for dysuria.  Musculoskeletal:  Positive for back pain.      Objective:  BP 120/70 (BP Location: Right Arm, Patient Position: Sitting, Cuff Size: Normal)   Pulse 85   Temp 98.1 F (36.7 C) (Oral)   Ht 5' 8 (1.727 m)   Wt 171 lb (77.6 kg)   LMP 04/02/2018   SpO2 96%   BMI 26.00 kg/m  BP Readings from Last 3 Encounters:  07/29/23 120/70  07/26/23 138/82  06/13/23 130/82   Wt Readings from Last 3 Encounters:  07/29/23 171 lb (77.6 kg)  07/26/23 171 lb 3.2 oz (77.7 kg)  06/13/23 170 lb (77.1 kg)      Physical Exam Vitals reviewed.  Constitutional:      General: She is not in acute distress.    Appearance: She is not ill-appearing.  Cardiovascular:     Rate and Rhythm: Normal rate and regular  rhythm.  Pulmonary:     Comments: Anteriorly heard a few faint end expiratory wheezes but none noted posteriorly.  No retractions.  No rales. Musculoskeletal:     Cervical back: Neck supple.     Comments: Spinal tenderness.  She does appear to have some curvature of the spine looking grossly  Neurological:     Mental Status: She is alert.      No results found for any visits on 07/29/23.    The 10-year ASCVD risk score (Arnett DK, et al., 2019) is: 3.4%    Assessment & Plan:   #1 persistent wheezing several times per week.  Has had intermittent asthma in the past and this has become more persistent.  Known triggers include pets and dairy. -Discussed avoidance is much as possible -Given increased frequency recommend initiating Symbicort  80 mg 2 puffs twice daily and rinse mouth after use -Set up 1 month follow-up to reassess. -Consider pulmonary referral if not improving with the above  #2 chronic intermittent low back pain.  Patient has concerns regarding scoliosis.  We decided given her several month history of pain to go and get plain films thoracic and lumbar spine.  Consider trial of physical therapy  #3 family history of nonsmall cell lung cancer EGFR positive in mother.  Patient requesting genetic counseling and referral will be placed  #4 situational anxiety and intermittent insomnia.  Patient has used infrequent lorazepam  in the past and we agreed to 1 refill 0.5 mg #30   Return in about 1 month (around 08/29/2023).    Wolm Scarlet, MD

## 2023-07-29 NOTE — Telephone Encounter (Signed)
 Marland Kitchen

## 2023-08-30 ENCOUNTER — Ambulatory Visit: Payer: Commercial Managed Care - HMO | Admitting: Family Medicine

## 2023-08-30 ENCOUNTER — Encounter: Payer: Self-pay | Admitting: Family Medicine

## 2023-08-30 VITALS — BP 110/70 | HR 96 | Temp 97.9°F | Ht 68.0 in | Wt 174.3 lb

## 2023-08-30 DIAGNOSIS — M545 Low back pain, unspecified: Secondary | ICD-10-CM | POA: Diagnosis not present

## 2023-08-30 DIAGNOSIS — R062 Wheezing: Secondary | ICD-10-CM | POA: Diagnosis not present

## 2023-08-30 DIAGNOSIS — Z23 Encounter for immunization: Secondary | ICD-10-CM

## 2023-08-30 NOTE — Progress Notes (Signed)
 Established Patient Office Visit  Subjective   Patient ID: Kelly Woodward, female    DOB: [birth year]  Age: 63 y.o. MRN: [id number]  Chief Complaint  Patient presents with   Medical Management of Chronic Issues    HPI   Kelly Woodward is here for medical follow-up.  Refer to prior note for detail.  She had significant mount of wheezing over the past year and was using her albuterol  rescue inhaler much more often than usual several times per week.  We had prescribed Symbicort  but cost was prohibitive with getting this filled.  Her wheezing has improved.  In retrospect, she feels like getting away from her mother's cats probably has made a big improvement.  She has no cough or wheezing at this time.  Has been using her albuterol  much less frequently.  She continues to have chronic low back pain left side without radiculitis features.  No lower extremity numbness or weakness.  Recent plain films lumbar and thoracic spine showed no acute abnormalities.  She has never had any physical therapy for her back.  She does request pneumonia vaccine today  Past Medical History:  Diagnosis Date   Allergy     Rhinitis   GERD (gastroesophageal reflux disease)    History of nephrolithiasis 1996   Osteopenia    Peptic ulcer    need to avoid ASA per pt.   Tobacco abuse    Vision abnormalities    Past Surgical History:  Procedure Laterality Date   cyst on vocal chord  1998    reports that she quit smoking about 6 years ago. Her smoking use included cigarettes. She has never used smokeless tobacco. She reports that she does not drink alcohol and does not use drugs. family history includes Cancer in her maternal grandfather and maternal grandmother; Cirrhosis in her father; Healthy in her brother; Hypertension in her mother; Melanoma in her daughter; Scoliosis in her father. Allergies  Allergen Reactions   Amoxicillin  Anaphylaxis    Voice changes and feels like throat is swelling and rash   Other  Anaphylaxis   Astelin Other (See Comments)    Review of Systems  Respiratory:  Negative for cough and wheezing.   Musculoskeletal:  Positive for back pain.  Neurological:  Negative for sensory change and focal weakness.      Objective:     BP 110/70 (BP Location: Left Arm, Patient Position: Sitting, Cuff Size: Large)   Pulse 96   Temp 97.9 F (36.6 C) (Oral)   Ht 5' 8 (1.727 m)   Wt 174 lb 4.8 oz (79.1 kg)   LMP 04/02/2018   SpO2 97%   BMI 26.50 kg/m  BP Readings from Last 3 Encounters:  08/30/23 110/70  07/29/23 120/70  07/26/23 138/82   Wt Readings from Last 3 Encounters:  08/30/23 174 lb 4.8 oz (79.1 kg)  07/29/23 171 lb (77.6 kg)  07/26/23 171 lb 3.2 oz (77.7 kg)      Physical Exam Vitals reviewed.  Constitutional:      General: She is not in acute distress.    Appearance: Normal appearance. She is not ill-appearing.  Cardiovascular:     Rate and Rhythm: Normal rate and regular rhythm.  Pulmonary:     Effort: Pulmonary effort is normal.     Breath sounds: Normal breath sounds. No wheezing or rales.  Neurological:     General: No focal deficit present.     Mental Status: She is alert.     Motor: No  weakness.      No results found for any visits on 08/30/23.    The 10-year ASCVD risk score (Arnett DK, et al., 2019) is: 2.9%    Assessment & Plan:   #1 recent increased wheezing.  Probably triggered by cat dander.  Now that she is out of environment with cats her wheezing has subsided.  She never started the Symbicort .  Continue as needed albuterol  for mild intermittent asthma  #2 chronic lumbar back pain.  No radiculitis features.  Given chronicity recommend setting up physical therapy and she agrees.  Order placed.  She does not have any neurologic deficits at this time.   No follow-ups on file.    Wolm Scarlet, MD

## 2023-09-21 ENCOUNTER — Encounter: Payer: Self-pay | Admitting: Physical Therapy

## 2023-09-21 ENCOUNTER — Ambulatory Visit: Payer: Commercial Managed Care - HMO | Attending: Family Medicine | Admitting: Physical Therapy

## 2023-09-21 ENCOUNTER — Other Ambulatory Visit: Payer: Self-pay

## 2023-09-21 DIAGNOSIS — M5459 Other low back pain: Secondary | ICD-10-CM | POA: Diagnosis present

## 2023-09-21 DIAGNOSIS — M545 Low back pain, unspecified: Secondary | ICD-10-CM | POA: Diagnosis not present

## 2023-09-21 DIAGNOSIS — M6281 Muscle weakness (generalized): Secondary | ICD-10-CM | POA: Diagnosis present

## 2023-09-21 DIAGNOSIS — R29898 Other symptoms and signs involving the musculoskeletal system: Secondary | ICD-10-CM | POA: Insufficient documentation

## 2023-09-21 NOTE — Therapy (Signed)
 OUTPATIENT PHYSICAL THERAPY THORACOLUMBAR EVALUATION   Patient Name: Kelly Woodward MRN: [id number] DOB:March 21, 1961, 63 y.o., female Today's Date: 09/21/2023  END OF SESSION:  PT End of Session - 09/21/23 0848     Visit Number 1    Number of Visits 2    Date for PT Re-Evaluation 10/19/23    Authorization Type [employer]    Authorization Time Period 09/21/23 to 10/19/23    PT Start Time 0847    PT Stop Time 0928    PT Time Calculation (min) 41 min    Activity Tolerance Patient tolerated treatment well    Behavior During Therapy [facility] for tasks assessed/performed             Past Medical History:  Diagnosis Date   Allergy    Rhinitis   GERD (gastroesophageal reflux disease)    History of nephrolithiasis 1996   Osteopenia    Peptic ulcer    need to avoid ASA per pt.   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   Diverticulosis 07/26/2023   Cervical radiculopathy 01/18/2017   Numbness 12/16/2016   Visual changes 12/16/2016   Urinary hesitancy 12/16/2016   Ulnar neuropathy 12/16/2016   Dry mouth 12/16/2016   Chronic insomnia 11/28/2016   TIA (transient ischemic attack) 01/05/2016   Hypokalemia 01/05/2016   GERD (gastroesophageal reflux disease)    Gastroesophageal reflux disease without esophagitis    Tobacco abuse    Anxiety 09/16/2010   Shoulder pain 09/16/2010   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   Seasonal and perennial allergic rhinitis 01/09/2007   Severe pre-eclampsia 01/09/2007   PRE-EC/ECLAMPSIA ON PRE-X HTN, ANTEPARTUM 01/09/2007   NEPHROLITHIASIS, HX OF 01/09/2007    PCP: Kristian Covey, MD  REFERRING PROVIDER: Kristian Covey, MD  REFERRING DIAG: Diagnosis M54.50 (ICD-10-CM) - Lumbar pain  Rationale for Evaluation and  Treatment: Rehabilitation  THERAPY DIAG:  Other low back pain  Muscle weakness (generalized)  Other symptoms and signs involving the musculoskeletal system  ONSET DATE: chronic (about a year ago)  SUBJECTIVE:                                                                                                                                                                                           SUBJECTIVE STATEMENT:  This started about a year ago, at one point I went to the ED to rule out anything related to  organs because it was wrapping around my side. They did find some diverticulosis, also scoliosis. My dad had this too. Every day it hurts, we bought a new mattress and it didn't help. When I stand up with "really good posture" it hurts the most and I find myself hunched to the L which isn't good. Hot showers and aleve helps. I'm hesitant to take meds bc my dad was on naproxen for a long time and died of liver failure. Last night I was really bad and wore high heels to an event. Had PT in the past for my foot, have orthotic insoles don't wear them every day but on my feet a lot as music therapist. I also have to carry a lot of heavy things, now trying to balance that weight as best I can. I have to pay almost $500 OOP so would like to make this a one time visit/limited f/u if able.   PERTINENT HISTORY:  See above   PAIN:  Are you having pain? Yes: NPRS scale: 4/10, can be 8/10 in mornings  Pain location: wrapping around L hip/going down L LE  Pain description: not sharp like a nerve, can take breath away  Aggravating factors: deep squats, mornings Relieving factors: heat, aleve   PRECAUTIONS: None  RED FLAGS: None   WEIGHT BEARING RESTRICTIONS: No  FALLS:  Has patient fallen in last 6 months? No  LIVING ENVIRONMENT: Lives with: lives with their spouse Lives in: House/apartment   OCCUPATION: music therapist   PLOF: Independent, Independent with basic ADLs, Independent with  gait, and Independent with transfers  PATIENT GOALS: get exercises to work on at home   NEXT MD VISIT: Referring unsure, PCP PRN   OBJECTIVE:  Note: Objective measures were completed at Evaluation unless otherwise noted.  DIAGNOSTIC FINDINGS:    CLINICAL DATA:  Back pain.   EXAM: THORACIC SPINE 2 VIEWS   COMPARISON:  None Available.   FINDINGS: There is no evidence of thoracic spine fracture. Alignment is normal. No other significant bone abnormalities are identified.   IMPRESSION: Negative.  Narrative & Impression  CLINICAL DATA:  chronic lumbar back pain. NKI   EXAM: LUMBAR SPINE - 3 VIEW   COMPARISON:  None Available.   FINDINGS: No fracture, dislocation or subluxation. Grade 1 L2 retrolisthesis. No osteolytic or osteoblastic changes.   Degenerative disc disease noted with disc space narrowing and marginal osteophytes at L1-2-3.   IMPRESSION: Degenerative changes. No acute osseous abnormalities.        COGNITION: Overall cognitive status: Within functional limits for tasks assessed       MUSCLE LENGTH:  Quads mild limitation B Hip flexors OK HS mild limitation B  Piriformis mild limitation B   POSTURE: rounded shoulders and forward head    LUMBAR ROM:   AROM eval  Flexion WNL   Extension WNL with some hyperextension  Right lateral flexion WNL   Left lateral flexion WNL but more limited than R   Right rotation Mild limitation   Left rotation Moderate limitation    (Blank rows = not tested)    LOWER EXTREMITY MMT:    MMT Right eval Left eval  Hip flexion 5 3 pain  Hip extension 4+ 3+ pain  Hip abduction 4+ 4+  Hip adduction    Hip internal rotation    Hip external rotation    Knee flexion 4+ 4  Knee extension 4+ 4  Ankle dorsiflexion    Ankle plantarflexion    Ankle inversion  Ankle eversion     (Blank rows = not tested)  LUMBAR SPECIAL TESTS:   Scour test L hip (-) FABER L (-) FADIR L (-)     TREATMENT DATE:    09/21/23  Eval, POC, advanced HEP planning   Practiced all exercises from HEP as below                                                                                                                                  PATIENT EDUCATION:  Education details: exam findings, discussed financial limitations of session, HEP, POC, anatomy of regions likely involved  Person educated: Patient Education method: Programmer, multimedia, Demonstration, Verbal cues, and Handouts Education comprehension: verbalized understanding, returned demonstration, verbal cues required, and needs further education  HOME EXERCISE PROGRAM: Access Code: [email or web address] URL: [email or web address] Date: 09/21/2023 Prepared by: Nedra Hai  Exercises - Supine Bridge with Resistance Band  - 1 x daily - 4-5 x weekly - 2 sets - 12 reps - 2 seconds  hold - Clamshell with Resistance  - 1 x daily - 4-5 x weekly - 2 sets - 10 reps - 1 second hold - Supine Posterior Pelvic Tilt  - 1 x daily - 7 x weekly - 2 sets - 10 reps - 3 seconds  hold - Supine March with Posterior Pelvic Tilt  - 1 x daily - 7 x weekly - 2 sets - 10 reps - Bilateral Bent Leg Lift  - 1 x daily - 7 x weekly - 2 sets - 10 reps - Supine Figure 4 Piriformis Stretch  - 1 x daily - 7 x weekly - 1 sets - 3 reps - 30 seconds hold - Supine Lower Trunk Rotation  - 1 x daily - 7 x weekly - 1 sets - 3 reps - 15-30 seconds  hold - Hooklying Hamstring Stretch with Strap  - 1 x daily - 7 x weekly - 1 sets - 3 reps - 30 seconds  hold - Standing Hip Hiking  - 1 x daily - 7 x weekly - 2 sets - 10 reps - Supine Single Knee to Chest Stretch  - 1 x daily - 7 x weekly - 1 sets - 15 reps - 1 second  hold  ASSESSMENT:  CLINICAL IMPRESSION: Patient is a 63 y.o. F who was seen today for physical therapy evaluation and treatment for Diagnosis M54.50 (ICD-10-CM) - Lumbar pain. Unfortunately she has a very high OOP expense for PT and this is limiting [place] to 1-2 visits only- focused  session on building appropriate HEP for independent use, will plan to potentially have her come back in about 3-4 weeks if she is not feeling any better.    OBJECTIVE IMPAIRMENTS: decreased ROM, decreased strength, increased muscle spasms, impaired flexibility, improper body mechanics, and pain.   ACTIVITY LIMITATIONS: standing, squatting, and locomotion level  PARTICIPATION LIMITATIONS: driving, shopping, community activity, occupation, and yard work  PERSONAL FACTORS: Behavior  pattern, Fitness, Past/current experiences, Profession, and Time since onset of injury/illness/exacerbation are also affecting patient's functional outcome.   REHAB POTENTIAL: Good  CLINICAL DECISION MAKING: Stable/uncomplicated  EVALUATION COMPLEXITY: Low   GOALS: Goals reviewed with patient? Yes  SHORT TERM GOALS: Target date: 10/19/2023    Will be compliant with appropriate progressive HEP  Baseline: Goal status: INITIAL  2.  All questions to have been satisfactorily addressed Baseline:  Goal status: INITIAL  3.  Pain to have improved by at least 50% especially in the mornings  Baseline:  Goal status: INITIAL  4.  Will be able to perform all functional work duties without increase in pain and with good biomechanics  Baseline:  Goal status: INITIAL  5.  MMT to be equal and symmetrical B Baseline:  Goal status: INITIAL    LONG TERM GOALS: Target date: LTGs = STGs     PLAN:  PT FREQUENCY:  1 additional visit   PT DURATION: 4 weeks  PLANNED INTERVENTIONS: 97164- PT Re-evaluation, 97110-Therapeutic exercises, 97530- Therapeutic activity, 97112- Neuromuscular re-education, 97535- Self Care, and 47425- Manual therapy.  PLAN FOR NEXT SESSION: how is she feeling? Re-assess and update HEP/POC PRN. DC if she feels good   Nedra Hai, PT, DPT 09/21/23 10:22 AM

## 2023-10-11 ENCOUNTER — Other Ambulatory Visit: Payer: Self-pay | Admitting: Genetic Counselor

## 2023-10-11 ENCOUNTER — Encounter: Payer: Self-pay | Admitting: Genetic Counselor

## 2023-10-11 ENCOUNTER — Inpatient Hospital Stay: Payer: Commercial Managed Care - HMO

## 2023-10-11 ENCOUNTER — Inpatient Hospital Stay: Payer: Commercial Managed Care - HMO | Attending: Genetic Counselor | Admitting: Genetic Counselor

## 2023-10-11 DIAGNOSIS — Z808 Family history of malignant neoplasm of other organs or systems: Secondary | ICD-10-CM | POA: Diagnosis not present

## 2023-10-11 LAB — GENETIC SCREENING ORDER

## 2023-10-11 NOTE — Progress Notes (Signed)
 REFERRING PROVIDER: Kristian Covey, MD [address] [place],  [place] [address]  PRIMARY PROVIDER:  Kristian Covey, MD  PRIMARY REASON FOR VISIT:  1. Family history of melanoma   2. Family history of glioblastoma      HISTORY OF PRESENT ILLNESS:   Ms. Kelly Woodward, a 63 y.o. female, was seen for a [facility] cancer genetics consultation at the request of Dr. Caryl Never due to a family history of melanoma and other cancers.  Kelly Woodward presents to clinic today to discuss the possibility of a hereditary predisposition to cancer, genetic testing, and to further clarify her future cancer risks, as well as potential cancer risks for family members.   Kelly Woodward is a 63 y.o. female with no personal history of cancer.  She is concerned about her mother having lung cancer and an EGFR mutation identified in the tumor.  CANCER HISTORY:  Oncology History   No history exists.     RISK FACTORS:  Menarche was at age 69.  First live birth at age 12.  OCP use for approximately  14  years.  Ovaries intact: yes.  Hysterectomy: no.  Menopausal status: postmenopausal.  HRT use: 11 years. Colonoscopy: yes; normal. Mammogram within the last year: yes. Number of breast biopsies: 1. Up to date with pelvic exams: yes. Any excessive radiation exposure in the past: no  Past Medical History:  Diagnosis Date   Allergy    Rhinitis   Family history of glioblastoma    Family history of melanoma    GERD (gastroesophageal reflux disease)    History of nephrolithiasis 1996   Osteopenia    Peptic ulcer    need to avoid ASA per pt.   Tobacco abuse    Vision abnormalities     Past Surgical History:  Procedure Laterality Date   cyst on vocal chord  1998    Social History   Socioeconomic History   Marital status: Married    Spouse name: Not on file   Number of children: 2   Years of education: 16   Highest education level: Bachelor's degree (e.g., BA, AB, BS)  Occupational  History   Occupation: music therapist  Tobacco Use   Smoking status: Former    Current packs/day: 0.00    Types: Cigarettes    Quit date: 11/05/2016    Years since quitting: 6.9   Smokeless tobacco: Never  Vaping Use   Vaping status: Never Used  Substance and Sexual Activity   Alcohol use: No    Alcohol/week: 0.0 standard drinks of alcohol    Comment: rare   Drug use: No   Sexual activity: Not on file  Other Topics Concern   Not on file  Social History Narrative   Lives with husband and daughters in a 2 story home.     Works as a Production manager (self-employed with personal business)   Education: [employer]   Social Drivers of Corporate investment banker Strain: Low Risk  (10/04/2022)   Overall Financial Resource Strain (CARDIA)    Difficulty of Paying Living Expenses: Not very hard  Food Insecurity: No Food Insecurity (10/04/2022)   Hunger Vital Sign    Worried About Running Out of Food in the Last Year: Never true    Ran Out of Food in the Last Year: Never true  Transportation Needs: No Transportation Needs (10/04/2022)   PRAPARE - Administrator, Civil Service (Medical): No    Lack of Transportation (Non-Medical): No  Physical Activity: Insufficiently Active (10/04/2022)   Exercise Vital Sign    Days of Exercise per Week: 3 days    Minutes of Exercise per Session: 30 min  Stress: No Stress Concern Present (10/04/2022)   [employer] of Occupational Health - Occupational Stress Questionnaire    Feeling of Stress : Only a little  Social Connections: Socially Integrated (10/04/2022)   Social Connection and Isolation Panel [NHANES]    Frequency of Communication with Friends and Family: More than three times a week    Frequency of Social Gatherings with Friends and Family: Three times a week    Attends Religious Services: More than 4 times per year    Active Member of Clubs or Organizations: Yes    Attends Engineer, structural: More than 4 times per year     Marital Status: Married     FAMILY HISTORY:  We obtained a detailed, 4-generation family history.  Significant diagnoses are listed below: Family History  Problem Relation Age of Onset   Hypertension Mother    Lung cancer Mother 63   Melanoma Mother 71 - 73       dx twice   Scoliosis Father    Cirrhosis Father        hep B   Healthy Brother    Colon cancer Maternal Grandmother    Brain cancer Maternal Grandfather        glioblastoma   Melanoma Daughter 21       x3 - under thumbnail, under breast, vulva     The patient has two daughters, one had her first melanoma at 96 under her thumbnail.  Two additional melanomas, under her breast and her vulva, were found.  She has a brother who is cancer free. Both parents are deceased.  The patient's father died of liver cirrhosis.  HE was adopted.  The patient's mother had two melanomas, the first in her early 71's.  She developed lung cancer at 41 and was found to have an EGFR mutation.  She had a brother and sister who were cancer free.  The maternal grandmother had colon cancer and grandfather had a glioblastoma.  Kelly Woodward is unaware of previous family history of genetic testing for hereditary cancer risks. Patient's maternal ancestors are of [place] and [place] descent, and paternal ancestors are of [place], [place] and [place] descent. There is no reported Ashkenazi Jewish ancestry. There is no known consanguinity.  GENETIC COUNSELING ASSESSMENT: Ms. Bassett is a 63 y.o. female with a family history of melanoma which is somewhat suggestive of a hereditary cancer syndrome and predisposition to cancer given the number of cases of melanoma at early ages. We, therefore, discussed and recommended the following at today's visit.   DISCUSSION: We discussed that, in general, most cancer is not inherited in families, but instead is sporadic or familial. Sporadic cancers occur by chance and typically happen at older ages (>50 years) as this  type of cancer is caused by genetic changes acquired during an individual's lifetime. Some families have more cancers than would be expected by chance; however, the ages or types of cancer are not consistent with a known genetic mutation or known genetic mutations have been ruled out. This type of familial cancer is thought to be due to a combination of multiple genetic, environmental, hormonal, and lifestyle factors. While this combination of factors likely increases the risk of cancer, the exact source of this risk is not currently identifiable or testable.  We discussed that 5 -  10% of melanoma is hereditary, with most cases associated with CDKN2A.  There are other genes that can be associated with hereditary melanoma cancer syndromes.  These include BAP1, POT1 and a few others.  Based on the rule of 3's, the patient meets testing criteria for melanoma syndromes.  We discussed that if her testing is negative, we still recommend increased screening for melanoma including in her eyes, due to her family history.  Additionally we discussed EGFR lung cancer.  The patient brought in testing performed on her mother's lung cancer that found an EGFR mutation.  We discussed that lung cancers are rarely hereditary.  AT this time, the EGFR mutation that is most commonly associated with hereditary lung cancer is EGFR T790M.  This is not the mutation in her mother's lung tumor.  We reviewed the difference between somatic and germline testing, as well as the benefits of each test.  While it is unlikely the mutation in Ms. Gough's mother is hereditary, the EGFR gene is one that is tested in our panels, and we can make sure that there are not hereditary mutations within that gene.  We discussed that testing is beneficial for several reasons including knowing how to follow individuals after  and understand if other family members could be at risk for cancer and allow them to undergo genetic testing.   We reviewed the  characteristics, features and inheritance patterns of hereditary cancer syndromes. We also discussed genetic testing, including the appropriate family members to test, the process of testing, insurance coverage and turn-around-time for results. We discussed the implications of a negative, positive, carrier and/or variant of uncertain significant result. Ms. Freels  was offered a common hereditary cancer panel (36+ genes) and an expanded pan-cancer panel (70+ genes). Ms. Dowling was informed of the benefits and limitations of each panel, including that expanded pan-cancer panels contain genes that do not have clear management guidelines at this point in time.  We also discussed that as the number of genes included on a panel increases, the chances of variants of uncertain significance increases. Ms. Melendrez decided to pursue genetic testing for the CancerNext-Expanded+RNAinsight gene panel.   The CancerNext-Expanded gene panel offered by [facility] and includes sequencing, rearrangement, and RNA analysis for the following 76 genes: AIP, ALK, APC, ATM, AXIN2, BAP1, BARD1, BMPR1A, BRCA1, BRCA2, BRIP1, CDC73, CDH1, CDK4, CDKN1B, CDKN2A, CEBPA, CHEK2, CTNNA1, DDX41, DICER1, ETV6, FH, FLCN, GATA2, LZTR1, MAX, MBD4, MEN1, MET, MLH1, MSH2, MSH3, MSH6, MUTYH, NF1, NF2, NTHL1, PALB2, PHOX2B, PMS2, POT1, PRKAR1A, PTCH1, PTEN, RAD51C, RAD51D, RB1, RET, RUNX1, SDHA, SDHAF2, SDHB, SDHC, SDHD, SMAD4, SMARCA4, SMARCB1, SMARCE1, STK11, SUFU, TMEM127, TP53, TSC1, TSC2, VHL, and WT1 (sequencing and deletion/duplication); EGFR, HOXB13, KIT, MITF, PDGFRA, POLD1, and POLE (sequencing only); EPCAM and GREM1 (deletion/duplication only).    Based on Ms. Jacobson's family history of cancer, she meets medical criteria for genetic testing. Though Ms. Waidelich is not personally affected, there are no affected family members that are willing/able/available to undergo hereditary cancer testing.  Therefore, Ms. Whiteman is the most  informative family member available.  Despite that she meets criteria, she may still have an out of pocket cost. We discussed that if her out of pocket cost for testing is over $100, the laboratory will call and confirm whether she wants to proceed with testing.  If the out of pocket cost of testing is less than $100 she will be billed by the genetic testing laboratory.   We discussed that some people do not want  to undergo genetic testing due to fear of genetic discrimination.  The Genetic Information Nondiscrimination Act (GINA) was signed into federal law in 2008. GINA prohibits health insurers and most employers from discriminating against individuals based on genetic information (including the results of genetic tests and family history information). According to GINA, health insurance companies cannot consider genetic information to be a preexisting condition, nor can they use it to make decisions regarding coverage or rates. GINA also makes it illegal for most employers to use genetic information in making decisions about hiring, firing, promotion, or terms of employment. It is important to note that GINA does not offer protections for life insurance, disability insurance, or long-term care insurance. GINA does not apply to those in the [employer], those who work for companies with less than 15 employees, and new life insurance or long-term disability insurance policies.  Health status due to a cancer diagnosis is not protected under GINA. More information about GINA can be found by visiting [email or web address].  PLAN: After considering the risks, benefits, and limitations, Ms. Srey provided informed consent to pursue genetic testing and the blood sample was sent to [facility] for analysis of the CancerNext-Expanded+RNAinsight. Results should be available within approximately 2-3 weeks' time, at which point they will be disclosed by telephone to Ms. Schild, as will any additional  recommendations warranted by these results. Ms. Erker will receive a summary of her genetic counseling visit and a copy of her results once available. This information will also be available in Epic.   Lastly, we encouraged Ms. Robbs to remain in contact with cancer genetics annually so that we can continuously update the family history and inform her of any changes in cancer genetics and testing that may be of benefit for this family.   Ms. Jurgensen questions were answered to her satisfaction today. Our contact information was provided should additional questions or concerns arise. Thank you for the referral and allowing [place] to share in the care of your patient.   Tytus Strahle P. Lowell Guitar, MS, CGC Licensed, Patent attorney [email or web address] phone: [phone]  60 minutes were spent on the date of the encounter in service to the patient including preparation, face-to-face consultation, documentation and care coordination.  The patient was seen alone.  Drs. Meliton Rattan, and/or [place] were available for questions, if needed..    _______________________________________________________________________ For Office Staff:  Number of people involved in session: 1 Was an Intern/ student involved with case: no

## 2023-10-24 ENCOUNTER — Encounter: Payer: Self-pay | Admitting: Genetic Counselor

## 2023-10-24 ENCOUNTER — Ambulatory Visit: Payer: Self-pay | Admitting: Genetic Counselor

## 2023-10-24 ENCOUNTER — Telehealth: Payer: Self-pay | Admitting: Genetic Counselor

## 2023-10-24 DIAGNOSIS — Z1379 Encounter for other screening for genetic and chromosomal anomalies: Secondary | ICD-10-CM

## 2023-10-24 NOTE — Telephone Encounter (Signed)
Revealed negative genetic testing.  Discussed that we do not know why she has cancer in the family. It could be due to a different gene that we are not testing, or maybe our current technology may not be able to pick something up.  It will be important for her to keep in contact with genetics to keep up with whether additional testing may be needed.

## 2023-10-24 NOTE — Progress Notes (Addendum)
 HPI:  Kelly Woodward was previously seen in the [facility] due to a family history of cancer and concerns regarding a hereditary predisposition to cancer. Please refer to our prior cancer genetics clinic note for more information regarding our discussion, assessment and recommendations, at the time. Kelly Woodward recent genetic test results were disclosed to her, as were recommendations warranted by these results. These results and recommendations are discussed in more detail below.  CANCER HISTORY:  Oncology History   No history exists.    FAMILY HISTORY:  We obtained a detailed, 4-generation family history.  Significant diagnoses are listed below: Family History  Problem Relation Age of Onset   Hypertension Mother    Lung cancer Mother 44   Melanoma Mother 3 - 49       dx twice   Scoliosis Father    Cirrhosis Father        hep B   Healthy Brother    Colon cancer Maternal Grandmother    Brain cancer Maternal Grandfather        glioblastoma   Melanoma Daughter 21       x3 - under thumbnail, under breast, vulva       The patient has two daughters, one had her first melanoma at 75 under her thumbnail.  Two additional melanomas, under her breast and her vulva, were found.  She has a brother who is cancer free. Both parents are deceased.   The patient's father died of liver cirrhosis.  He was adopted.   The patient's mother had two melanomas, the first in her early 71's.  She developed lung cancer at 37 and was found to have an EGFR mutation.  She had a brother and sister who were cancer free.  The maternal grandmother had colon cancer and grandfather had a glioblastoma.   Kelly Woodward is unaware of previous family history of genetic testing for hereditary cancer risks. Patient's maternal ancestors are of [place] and [place] descent, and paternal ancestors are of [place], [place] and [place] descent. There is no reported Ashkenazi Jewish ancestry. There is no  known consanguinity.  GENETIC TEST RESULTS: Genetic testing reported out on October 22, 2023 through the CancerNext-Expanded+RNAinsight cancer panel found no pathogenic mutations. The CancerNext-Expanded gene panel offered by [facility] and includes sequencing, rearrangement, and RNA analysis for the following 76 genes: AIP, ALK, APC, ATM, AXIN2, BAP1, BARD1, BMPR1A, BRCA1, BRCA2, BRIP1, CDC73, CDH1, CDK4, CDKN1B, CDKN2A, CEBPA, CHEK2, CTNNA1, DDX41, DICER1, ETV6, FH, FLCN, GATA2, LZTR1, MAX, MBD4, MEN1, MET, MLH1, MSH2, MSH3, MSH6, MUTYH, NF1, NF2, NTHL1, PALB2, PHOX2B, PMS2, POT1, PRKAR1A, PTCH1, PTEN, RAD51C, RAD51D, RB1, RET, RUNX1, SDHA, SDHAF2, SDHB, SDHC, SDHD, SMAD4, SMARCA4, SMARCB1, SMARCE1, STK11, SUFU, TMEM127, TP53, TSC1, TSC2, VHL, and WT1 (sequencing and deletion/duplication); EGFR, HOXB13, KIT, MITF, PDGFRA, POLD1, and POLE (sequencing only); EPCAM and GREM1 (deletion/duplication only). The test report has been scanned into EPIC and is located under the Molecular Pathology section of the Results Review tab.  A portion of the result report is included below for reference.     We discussed with Kelly Woodward that because current genetic testing is not perfect, it is possible there may be a gene mutation in one of these genes that current testing cannot detect, but that chance is small.  We also discussed, that there could be another gene that has not yet been discovered, or that we have not yet tested, that is responsible for the cancer diagnoses in the family. It is also  possible there is a hereditary cause for the cancer in the family that Kelly Woodward did not inherit and therefore was not identified in her testing.  Therefore, it is important to remain in touch with cancer genetics in the future so that we can continue to offer Kelly Woodward the most up to date genetic testing.   ADDITIONAL GENETIC TESTING: We discussed with Kelly Woodward that her genetic testing was fairly extensive.  If there  are genes identified to increase cancer risk that can be analyzed in the future, we would be happy to discuss and coordinate this testing at that time.    CANCER SCREENING RECOMMENDATIONS: Kelly Woodward test result is considered negative (normal).  This means that we have not identified a hereditary cause for her family history of cancer at this time. Most cancers happen by chance and this negative test suggests that her family history of cancer may fall into this category.    Possible reasons for Kelly Woodward's negative genetic test include:  1. There may be a gene mutation in one of these genes that current testing methods cannot detect but that chance is small.  2. There could be another gene that has not yet been discovered, or that we have not yet tested, that is responsible for the cancer diagnoses in the family.  3.  There may be no hereditary risk for cancer in the family. The cancers in Kelly Woodward and/or her family may be sporadic/familial or due to other genetic and environmental factors. 4. It is also possible there is a hereditary cause for the cancer in the family that Kelly Woodward did not inherit.  Therefore, it is recommended she continue to follow the cancer management and screening guidelines provided by her primary healthcare provider. An individual's cancer risk and medical management are not determined by genetic test results alone. Overall cancer risk assessment incorporates additional factors, including personal medical history, family history, and any available genetic information that may result in a personalized plan for cancer prevention and surveillance  RECOMMENDATIONS FOR FAMILY MEMBERS:   Since she did not inherit a identifiable mutation in a cancer predisposition gene included on this panel, her children could not have inherited a known mutation from her in one of these genes. Individuals in this family might be at some increased risk of developing cancer, over the general  population risk, simply due to the family history of cancer.  We recommended women in this family have a yearly mammogram beginning at age 35, or 3 years younger than the earliest onset of cancer, an annual clinical breast exam, and perform monthly breast self-exams. Women in this family should also have a gynecological exam as recommended by their primary provider. All family members should be referred for colonoscopy starting at age 74, or 90 years younger than the earliest onset of cancer. We recommend, based on the family history of early onset melanoma, that Kelly Woodward obtain a good skin check, and recommend follow up based on the recommendations of her dermatologist.  She reports having made an appointment for a dermatologist in [facility] in a few weeks. It is also possible there is a hereditary cause for the cancer in Kelly Woodward's family that she did not inherit and therefore was not identified in her.  Based on Kelly Woodward family history, we recommended her daughter, who was diagnosed with several melanomas with the first one at age 37, have genetic counseling and testing. Kelly Woodward will let us know if we can  be of any assistance in coordinating genetic counseling and/or testing for this family member.   FOLLOW-UP: Lastly, we discussed with Kelly Woodward that cancer genetics is a rapidly advancing field and it is possible that new genetic tests will be appropriate for her and/or her family members in the future. We encouraged her to remain in contact with cancer genetics on an annual basis so we can update her personal and family histories and let her know of advances in cancer genetics that may benefit this family.   Our contact number was provided. Ms. Triplett questions were answered to her satisfaction, and she knows she is welcome to call us at anytime with additional questions or concerns.   Maylon Cos, MS, [facility] Licensed, Certified Genetic Counselor [email or web address]

## 2023-10-26 ENCOUNTER — Ambulatory Visit: Payer: Commercial Managed Care - HMO | Admitting: Physical Therapy

## 2023-12-16 ENCOUNTER — Other Ambulatory Visit: Payer: Self-pay | Admitting: Family Medicine

## 2023-12-16 MED ORDER — LORAZEPAM 0.5 MG PO TABS: 0.5000 mg | ORAL_TABLET | Freq: Every evening | ORAL | 0 refills | Status: DC | PRN

## 2023-12-16 NOTE — Telephone Encounter (Signed)
 Copied from CRM [id number]. Topic: Clinical - Medication Refill >> Dec 16, 2023 10:50 AM Crispin Dolphin wrote: Medication: LORazepam  (ATIVAN ) 0.5 MG tablet  Has the patient contacted their pharmacy? Yes (Agent: If no, request that the patient contact the pharmacy for the refill. If patient does not wish to contact the pharmacy document the reason why and proceed with request.) (Agent: If yes, when and what did the pharmacy advise?) No refills - contact provider   This is the patient's preferred pharmacy:  [id number] [place], [place] - [address] [address] Phone: [phone] Fax: [phone]   Is this the correct pharmacy for this prescription? Yes If no, delete pharmacy and type the correct one.   Has the prescription been filled recently? No  Is the patient out of the medication? Yes  Has the patient been seen for an appointment in the last year OR does the patient have an upcoming appointment? Yes  Can we respond through MyChart? Yes  Agent: Please be advised that Rx refills may take up to 3 business days. We ask that you follow-up with your pharmacy.

## 2023-12-16 NOTE — Telephone Encounter (Signed)
 Last Fill: 07/29/23 30 tabs/0 RF  Last OV: 08/30/23 Next OV: None Scheduled  Routing to provider for review/authorization.

## 2024-01-03 ENCOUNTER — Other Ambulatory Visit: Payer: Self-pay | Admitting: Obstetrics and Gynecology

## 2024-01-03 DIAGNOSIS — Z1231 Encounter for screening mammogram for malignant neoplasm of breast: Secondary | ICD-10-CM

## 2024-02-03 ENCOUNTER — Other Ambulatory Visit: Payer: Self-pay | Admitting: Obstetrics and Gynecology

## 2024-02-03 DIAGNOSIS — R928 Other abnormal and inconclusive findings on diagnostic imaging of breast: Secondary | ICD-10-CM

## 2024-03-16 ENCOUNTER — Encounter: Payer: Self-pay | Admitting: Obstetrics and Gynecology

## 2024-03-19 ENCOUNTER — Ambulatory Visit
Admission: RE | Admit: 2024-03-19 | Discharge: 2024-03-19 | Disposition: A | Discharge status: Home / Self Care | Source: Ambulatory Visit | Attending: Obstetrics and Gynecology | Admitting: Obstetrics and Gynecology

## 2024-03-19 DIAGNOSIS — R928 Other abnormal and inconclusive findings on diagnostic imaging of breast: Secondary | ICD-10-CM

## 2024-04-19 ENCOUNTER — Ambulatory Visit: Admitting: Family Medicine

## 2024-04-19 ENCOUNTER — Encounter: Payer: Self-pay | Admitting: Family Medicine

## 2024-04-19 ENCOUNTER — Ambulatory Visit: Payer: Self-pay

## 2024-04-19 VITALS — BP 130/80 | HR 103 | Temp 98.0°F | Wt 175.1 lb

## 2024-04-19 DIAGNOSIS — R040 Epistaxis: Secondary | ICD-10-CM

## 2024-04-19 DIAGNOSIS — F5104 Psychophysiologic insomnia: Secondary | ICD-10-CM

## 2024-04-19 DIAGNOSIS — J383 Other diseases of vocal cords: Secondary | ICD-10-CM

## 2024-04-19 DIAGNOSIS — J4540 Moderate persistent asthma, uncomplicated: Secondary | ICD-10-CM

## 2024-04-19 DIAGNOSIS — J4541 Moderate persistent asthma with (acute) exacerbation: Secondary | ICD-10-CM

## 2024-04-19 MED ORDER — METHYLPREDNISOLONE ACETATE 80 MG/ML IJ SUSP
80.0000 mg | Freq: Once | INTRAMUSCULAR | Status: AC
Start: 2024-04-19 — End: 2024-04-19
  Administered 2024-04-19: 80 mg via INTRAMUSCULAR

## 2024-04-19 MED ORDER — BUDESONIDE-FORMOTEROL FUMARATE 160-4.5 MCG/ACT IN AERO: 2.0000 | INHALATION_SPRAY | Freq: Two times a day (BID) | RESPIRATORY_TRACT | 3 refills | Status: DC

## 2024-04-19 MED ORDER — LORAZEPAM 0.5 MG PO TABS: 0.5000 mg | ORAL_TABLET | Freq: Every evening | ORAL | 0 refills | Status: DC | PRN

## 2024-04-19 NOTE — Progress Notes (Signed)
 Acute Office Visit  Subjective:     Patient ID: Kelly Woodward, female    DOB: November 12, 1960, 63 y.o.   MRN: [id number]  Chief Complaint  Patient presents with   Wheezing   Cough   Allergies    Wheezing  Associated symptoms include coughing.  Cough Associated symptoms include wheezing.   Patient is in today for acute wheezing, cough and allergies. Hx vocal cord surgery in 1999, still has issues with scar tissue on vocal cords, has a chronic history of allergies, has to have injections twice a year for her severe allergies, has taken prednisone  in the past but it causes side effects. States this allergy  season has been horrific. States that she has had COVID three times and she is having more trouble with asthma and breathing. Is having to use her inhaler daily/twice daily more recently. States she is getting the tightness in her throat again that she has had in the past.   Review of Systems  Respiratory:  Positive for cough and wheezing.   All other systems reviewed and are negative.       Objective:    BP 130/80   Pulse (!) 103   Temp 98 F (36.7 C) (Oral)   Wt 175 lb 1.6 oz (79.4 kg)   LMP 04/02/2018   SpO2 97%   BMI 26.62 kg/m    Physical Exam Vitals reviewed.  Constitutional:      Appearance: Normal appearance. She is normal weight.  HENT:     Right Ear: Tympanic membrane normal.     Nose: Congestion and rhinorrhea present.     Mouth/Throat:     Mouth: Mucous membranes are moist.     Pharynx: No posterior oropharyngeal erythema.  Eyes:     Conjunctiva/sclera: Conjunctivae normal.  Cardiovascular:     Rate and Rhythm: Normal rate and regular rhythm.     Heart sounds: Normal heart sounds. No murmur heard. Pulmonary:     Effort: Pulmonary effort is normal.     Breath sounds: Wheezing (prolonged expiratory phase and end expiratory wheezing diffusely) present.  Musculoskeletal:     Cervical back: Normal range of motion.     Right lower leg: No edema.      Left lower leg: No edema.  Lymphadenopathy:     Cervical: No cervical adenopathy.  Neurological:     Mental Status: She is alert and oriented to person, place, and time. Mental status is at baseline.  Psychiatric:        Mood and Affect: Mood normal.        Behavior: Behavior normal.     No results found for any visits on 04/19/24.      Assessment & Plan:   Problem List Items Addressed This Visit       Unprioritized   Chronic insomnia   Chronic, ongoing, Pt is requesting a refill on the lorazepam , Dr. Micheal is out of the office currently so will refill a small amount, PDMP reviewed. Pt reports she is only taking the lorazepam  as needed, about 30 tablets lasts her 3 months....      Relevant Medications   LORazepam  (ATIVAN ) 0.5 MG tablet   Moderate persistent asthma   Chronic, ongoing, I believe patient would benefit from a controller inhaler like symbicort  to help control her symptoms daily. I do not think abx are needed today. Pt is requesting a steroid injection, 80 mg injection given today. I also recommended she see the pulmonologist and ENT for  the chronic symptoms. Referrals placed.      Relevant Medications   budesonide -formoterol  (SYMBICORT ) 160-4.5 MCG/ACT inhaler   Other Relevant Orders   Ambulatory referral to Pulmonology   Other Visit Diagnoses       Acute exacerbation of moderate persistent extrinsic asthma    -  Primary   Relevant Medications   budesonide -formoterol  (SYMBICORT ) 160-4.5 MCG/ACT inhaler   methylPREDNISolone  acetate (DEPO-MEDROL ) injection 80 mg (Completed)     Frequent nosebleeds       Relevant Orders   Ambulatory referral to ENT     Vocal cord dysfunction       Relevant Orders   Ambulatory referral to ENT      Meds ordered this encounter  Medications   LORazepam  (ATIVAN ) 0.5 MG tablet    Sig: Take 1 tablet (0.5 mg total) by mouth at bedtime as needed.    Dispense:  10 tablet    Refill:  0    This request is for a new  prescription for a controlled substance as required by Federal/State law.PATIENT REQUESTS NEW RX ASAP.   budesonide -formoterol  (SYMBICORT ) 160-4.5 MCG/ACT inhaler    Sig: Inhale 2 puffs into the lungs 2 (two) times daily.    Dispense:  1 each    Refill:  3   methylPREDNISolone  acetate (DEPO-MEDROL ) injection 80 mg  I personally spent a total of 30 minutes in the care of the patient today including getting/reviewing separately obtained history, performing a medically appropriate exam/evaluation, counseling and educating, placing orders, and independently interpreting results.,  No follow-ups on file.  Heron CHRISTELLA Sharper, MD

## 2024-04-19 NOTE — Telephone Encounter (Signed)
  Pt declined triage. I'm just having an allergy  flare up with coughing and wheezing. I'm having to use my inhaler but not having difficulty breathing.       Copied from CRM #[id number]. Topic: Clinical - Red Word Triage >> Apr 19, 2024  8:24 AM Carlyon D wrote: Red Word that prompted transfer to Nurse Triage: allergies, constant nose bleeding, Asthma (wheezing, coughing and difficulty breathing)

## 2024-04-19 NOTE — Patient Instructions (Signed)
 Xyzal 5 mg daily  Or Allegra 180 mg daily

## 2024-04-24 NOTE — Assessment & Plan Note (Signed)
 Chronic, ongoing, I believe patient would benefit from a controller inhaler like symbicort  to help control her symptoms daily. I do not think abx are needed today. Pt is requesting a steroid injection, 80 mg injection given today. I also recommended she see the pulmonologist and ENT for the chronic symptoms. Referrals placed.

## 2024-04-24 NOTE — Assessment & Plan Note (Signed)
 Chronic, ongoing, Pt is requesting a refill on the lorazepam , Dr. Micheal is out of the office currently so will refill a small amount, PDMP reviewed. Pt reports she is only taking the lorazepam  as needed, about 30 tablets lasts her 3 months.SABRASABRASABRA

## 2024-05-24 ENCOUNTER — Other Ambulatory Visit: Payer: Self-pay | Admitting: Internal Medicine

## 2024-05-24 ENCOUNTER — Encounter: Payer: Self-pay | Admitting: Internal Medicine

## 2024-05-24 ENCOUNTER — Ambulatory Visit: Admitting: Internal Medicine

## 2024-05-24 VITALS — BP 124/80 | HR 89 | Temp 97.6°F | Ht 69.0 in | Wt 176.0 lb

## 2024-05-24 DIAGNOSIS — J4489 Other specified chronic obstructive pulmonary disease: Secondary | ICD-10-CM | POA: Diagnosis not present

## 2024-05-24 MED ORDER — FAMOTIDINE 20 MG PO TABS: 20.0000 mg | ORAL_TABLET | Freq: Two times a day (BID) | ORAL | 11 refills | Status: AC

## 2024-05-24 MED ORDER — BEVESPI AEROSPHERE 9-4.8 MCG/ACT IN AERO: 2.0000 | INHALATION_SPRAY | Freq: Two times a day (BID) | RESPIRATORY_TRACT | 11 refills | Status: DC

## 2024-05-24 MED ORDER — MONTELUKAST SODIUM 10 MG PO TABS
10.0000 mg | ORAL_TABLET | Freq: Every day | ORAL | 11 refills | Status: AC
Start: 2024-05-24 — End: ?

## 2024-05-24 NOTE — Patient Instructions (Addendum)
 Singulair 10 mg  each pm  and pepcid  20 mg after bfast and after supper    My office will be contacting you by phone for referral to Allergy  (DR Kozlow)   - if you don't hear back from my office within one week please call us  back or notify us  thru MyChart and we'll address it right away.   Use your albuterol  as a rescue medication to be used if you can't catch your breath by resting, slowing your pace,  or doing a relaxed purse lip breathing pattern.  - The less you use it, the better it will work when you need it. - Ok to use up to 2 puffs  every 4 hours if you must but call for  appointment if use goes up over your usual need - Don't leave home without it !!  (think of it like a spare tire or starter fluid for your car)     .Also  Ok to try albuterol  15 min before an activity (on alternating days)  that you know would usually make you short of breath and see if it makes any difference and if makes none then don't take albuterol  after activity unless you can't catch your breath as this means it's the resting that helps, not the albuterol .       Pulmonary follow up is as needed

## 2024-05-24 NOTE — Progress Notes (Signed)
 Kelly Woodward, female    DOB: May 03, 1961   MRN: [id number]   Brief patient profile:  63   yo wm minimal smoking hx stopped in 1980s  referred to pulmonary clinic 05/24/2024 by [facility]  for asthma triggered by seasonal rhinitis starting  back as teenage the changed p  multiple covid infections last in 2023 with anosmia        Pt not previously seen by PCCM service - seen by [facility] 08/30/23 for allergic rhinitis   Ent eval pos atopy  but can't tol shots    History of Present Illness  05/24/2024  Pulmonary/ 1st office eval/Kelly Woodward  last pred rx was 4 weeks prior to OV  / can't take ICS as had severe hoarseness on prev exposure to nasal steroids Chief Complaint  Patient presents with   Consult   Asthma  Dyspnea:  inclines always and issue  Cough: once a week - one episode p chocolate  Sleep: hob up a few inches one pillow  SABA use: albuterol  this fall 2-3 days  02 ldz:wnwz LDSCT:none  Overt HB uses tums at hs  - note cough with chocolate above   No obvious day to day or daytime pattern/variability or assoc excess/ purulent sputum or mucus plugs or hemoptysis or cp or chest tightness, subjective wheeze .    Also denies any obvious fluctuation of symptoms with weather or environmental changes or other aggravating or alleviating factors except as outlined above   No unusual exposure hx or h/o childhood pna/ asthma or knowledge of premature birth.  Current Allergies, Complete Past Medical History, Past Surgical History, Family History, and Social History were reviewed in [employer] record.  ROS  The following are not active complaints unless bolded Hoarseness, sore throat, dysphagia, dental problems, itching, sneezing,  nasal congestion or discharge of excess mucus or purulent secretions, ear ache,   fever, chills, sweats, unintended wt loss or wt gain, classically pleuritic or exertional cp,  orthopnea pnd or arm/hand swelling  or leg swelling, presyncope,  palpitations, abdominal pain, anorexia, nausea, vomiting, diarrhea  or change in bowel habits or change in bladder habits, change in stools or change in urine, dysuria, hematuria,  rash, arthralgias, visual complaints, headache, numbness, weakness or ataxia or problems with walking or coordination,  change in mood or  memory.             Outpatient Medications Prior to Visit  Medication Sig Dispense Refill   albuterol  (VENTOLIN  HFA) 108 (90 Base) MCG/ACT inhaler Inhale 2 puffs into the lungs every 6 (six) hours as needed for wheezing or shortness of breath. 1 each 1   calcium  carbonate (TUMS - DOSED IN MG ELEMENTAL CALCIUM ) 500 MG chewable tablet Chew 1 tablet by mouth daily as needed for indigestion or heartburn.     Cobalamine Combinations (B-12) [id number] MCG SUBL B-12     estradiol (ESTRACE) 0.1 MG/GM vaginal cream INSERT 1GRAM EVERY DAY BY VAGINAL ROUTE     estradiol (VIVELLE-DOT) 0.05 MG/24HR patch Place 1 patch onto the skin 2 (two) times a week.     levocetirizine (XYZAL) 5 MG tablet Take 5 mg by mouth every evening.     LORazepam  (ATIVAN ) 0.5 MG tablet Take 1 tablet (0.5 mg total) by mouth at bedtime as needed. 10 tablet 0   progesterone (PROMETRIUM) 100 MG capsule Take 100 mg by mouth at bedtime.     budesonide -formoterol  (SYMBICORT ) 160-4.5 MCG/ACT inhaler Inhale 2 puffs into the lungs 2 (two)  times daily. (Patient not taking: Reported on 05/24/2024) 1 each 3   cetirizine (ZYRTEC) 10 MG tablet Take 10 mg by mouth daily.   (Patient not taking: Reported on 05/24/2024)     Spacer/Aero-Holding Chambers (E-Z SPACER) inhaler Use as instructed (Patient not taking: Reported on 05/24/2024) 1 each 2   No facility-administered medications prior to visit.    Past Medical History:  Diagnosis Date   Allergy     Rhinitis   Family history of glioblastoma    Family history of melanoma    GERD (gastroesophageal reflux disease)    History of nephrolithiasis 1996   Osteopenia    Peptic ulcer     need to avoid ASA per pt.   Tobacco abuse    Vision abnormalities       Objective:     BP 124/80   Pulse 89   Temp 97.6 F (36.4 C) (Oral)   Ht 5' 9 (1.753 m)   Wt 176 lb (79.8 kg)   LMP 04/02/2018   SpO2 97%   BMI 25.99 kg/m   SpO2: 97 % pleasant verbose amb wf nad   HEENT : Oropharynx  nl      Nasal turbinates nl    NECK :  without  apparent JVD/ palpable Nodes/TM    LUNGS: no acc muscle use,  Nl contour chest which is clear to A and P bilaterally without cough on insp or exp maneuvers   CV:  RRR  no s3 or murmur or increase in P2, and no edema   ABD:  soft and nontender   MS:  Gait nl   ext warm without deformities Or obvious joint restrictions  calf tenderness, cyanosis or clubbing    SKIN: warm and dry without lesions    NEURO:  alert, approp, nl sensorium with  no motor or cerebellar deficits apparent.       Assessment     Assessment & Plan Asthmatic bronchitis , chronic (HCC) Onset as teenager intol to allergy  shots/ ICS/ PPI (B12 def)  - 05/24/2024  After extensive coaching inhaler device,  effectiveness =    75% (short ti) - 05/24/2024 referred to allergy [facility] and added pepcid  20 mg bid and singulair q pm   Reviewed rule of 2s guiding need for short course prednisone  pending allergy  eval   Pulmonary f/u is prn         Each maintenance medication was reviewed in detail including emphasizing most importantly the difference between maintenance and prns and under what circumstances the prns are to be triggered using an action plan format where appropriate.  Total time for H and P, chart review, counseling, reviewing hfa device(s) and generating customized AVS unique to this office visit / same day charting = 34 min new pt eval          AVS  Patient Instructions  Singulair 10 mg  each pm  and pepcid  20 mg after bfast and after supper    My office will be contacting you by phone for referral to Allergy  (DR Kozlow)   - if you don't hear  back from my office within one week please call us  back or notify us  thru MyChart and we'll address it right away.   Use your albuterol  as a rescue medication to be used if you can't catch your breath by resting, slowing your pace,  or doing a relaxed purse lip breathing pattern.  - The less you use it, the better it will work when you  need it. - Ok to use up to 2 puffs  every 4 hours if you must but call for  appointment if use goes up over your usual need - Don't leave home without it !!  (think of it like a spare tire or starter fluid for your car)     .Also  Ok to try albuterol  15 min before an activity (on alternating days)  that you know would usually make you short of breath and see if it makes any difference and if makes none then don't take albuterol  after activity unless you can't catch your breath as this means it's the resting that helps, not the albuterol .       Pulmonary follow up is as needed        Ozell America, MD 05/24/2024

## 2024-05-24 NOTE — Telephone Encounter (Signed)
Please advise on alternatives.

## 2024-05-24 NOTE — Assessment & Plan Note (Addendum)
 Onset as teenager intol to allergy  shots/ ICS/ PPI (B12 def)  - 05/24/2024  After extensive coaching inhaler device,  effectiveness =    75% (short ti) - 05/24/2024 referred to allergy [facility] and added pepcid  20 mg bid and singulair q pm   Reviewed rule of 2s guiding need for short course prednisone  pending allergy  eval   Pulmonary f/u is prn         Each maintenance medication was reviewed in detail including emphasizing most importantly the difference between maintenance and prns and under what circumstances the prns are to be triggered using an action plan format where appropriate.  Total time for H and P, chart review, counseling, reviewing hfa device(s) and generating customized AVS unique to this office visit / same day charting = 34 min new pt eval

## 2024-05-28 ENCOUNTER — Other Ambulatory Visit (HOSPITAL_COMMUNITY): Payer: Self-pay

## 2024-06-05 ENCOUNTER — Ambulatory Visit (INDEPENDENT_AMBULATORY_CARE_PROVIDER_SITE_OTHER): Admitting: Otolaryngology

## 2024-06-05 VITALS — BP 134/90 | HR 86 | Ht 69.0 in | Wt 173.0 lb

## 2024-06-05 DIAGNOSIS — R040 Epistaxis: Secondary | ICD-10-CM | POA: Diagnosis not present

## 2024-06-05 NOTE — Progress Notes (Signed)
 Reason for Consult: Epistaxis Referring Physician: Dr. Micheal Grayce LITTIE Kelly Woodward is an 63 y.o. female.  HPI: History of epistaxis on the left side.  It is always from the left side.  She is having about 3 episodes per week.  She can stop it easily with just sticking her finger in the left side of her nose and she knows exactly where the area is.  She has very brief bleeding episodes.  She probably has been bleeding for many months to a year.  She has also some vocal cord issue history.  She has seen otolaryngology and had multiple procedures.  She now feels like her voice is normal and is not here to complain or have that evaluated.  Past Medical History:  Diagnosis Date   Allergy     Rhinitis   Family history of glioblastoma    Family history of melanoma    GERD (gastroesophageal reflux disease)    History of nephrolithiasis 1996   Osteopenia    Peptic ulcer    need to avoid ASA per pt.   Tobacco abuse    Vision abnormalities     Past Surgical History:  Procedure Laterality Date   cyst on vocal chord  1998    Family History  Problem Relation Age of Onset   Hypertension Mother    Lung cancer Mother 38   Melanoma Mother 57 - 53       dx twice   Scoliosis Father    Cirrhosis Father        hep B   Healthy Brother    Colon cancer Maternal Grandmother    Brain cancer Maternal Grandfather        glioblastoma   Melanoma Daughter 21       x3 - under thumbnail, under breast, vulva    Social History:  reports that she quit smoking about 7 years ago. Her smoking use included cigarettes. She has never used smokeless tobacco. She reports that she does not drink alcohol and does not use drugs.  Allergies:  Allergies  Allergen Reactions   Amoxicillin  Anaphylaxis    Voice changes and feels like throat is swelling and rash   Other Anaphylaxis   Astelin Other (See Comments)    Medications: I have reviewed the patient's current medications.  No results found for this or any  previous visit (from the past 48 hours).  No results found.  ROS Blood pressure (!) 134/90, pulse 86, height 5' 9 (1.753 m), weight 173 lb (78.5 kg), last menstrual period 04/02/2018, SpO2 98%. Physical Exam Constitutional:      Appearance: Normal appearance.  HENT:     Head: Normocephalic and atraumatic.     Right Ear: Tympanic membrane is without lesions and middle ear aerated, ear canal and external ear normal.     Left Ear: Tympanic membrane is without lesions and middle ear aerated, ear canal and external ear normal.     Nose: Nose the septum has a obvious slightly granular area on the left anterior septum that is almost certainly the bleeding source that she has been talking about.  There is no other lesions.  Her septum is relatively midline with just a small spur inferiorly.. Turbinates with mild hypertrophy, No significant swelling or masses.     Oral cavity/oropharynx: Mucous membranes are moist. No lesions or masses    Larynx: normal voice. Mirror attempted without success    Eyes:     Extraocular Movements: Extraocular movements intact.  Conjunctiva/sclera: Conjunctivae normal.     Pupils: Pupils are equal, round, and reactive to light.  Cardiovascular:     Rate and Rhythm: Normal rate.  Pulmonary:     Effort: Pulmonary effort is normal.  Musculoskeletal:     Cervical back: Normal range of motion and neck supple. No rigidity.  Lymphadenopathy:     Cervical: No cervical adenopathy or masses.salivary glands without lesions. .  Neurological:     Mental Status: He is alert. CN 2-12 intact. No nystagmus      Assessment/Plan: Kelly Woodward has a obvious site on the left anterior septum that is almost certainly the source of her bleeding.  We talked about cautery but she is leaving for [place]  at 6:00 in the morning and would prefer not to undergo this procedure now.  She will be back in about a week and we will reschedule her to proceed with a endoscopy/cauterization of  this blood vessel spot.  Kelly Woodward Notice 06/05/2024, 9:17 AM

## 2024-06-13 ENCOUNTER — Encounter (INDEPENDENT_AMBULATORY_CARE_PROVIDER_SITE_OTHER): Payer: Self-pay | Admitting: Otolaryngology

## 2024-06-13 ENCOUNTER — Ambulatory Visit (INDEPENDENT_AMBULATORY_CARE_PROVIDER_SITE_OTHER): Admitting: Otolaryngology

## 2024-06-13 VITALS — BP 125/84 | HR 88 | Temp 98.6°F

## 2024-06-13 DIAGNOSIS — R040 Epistaxis: Secondary | ICD-10-CM

## 2024-06-13 NOTE — Progress Notes (Signed)
 Reason for Consult: Epistaxis Referring Physician: Dr. Micheal Grayce Woodward Kelly Woodward is an 63 y.o. female.  HPI: She is here for follow-up for her epistaxis.  She had 2 episodes while she was in [place] .  She is now ready to cauterize.  No other changes.  Past Medical History:  Diagnosis Date   Allergy     Rhinitis   Family history of glioblastoma    Family history of melanoma    GERD (gastroesophageal reflux disease)    History of nephrolithiasis 1996   Osteopenia    Peptic ulcer    need to avoid ASA per pt.   Tobacco abuse    Vision abnormalities     Past Surgical History:  Procedure Laterality Date   cyst on vocal chord  1998    Family History  Problem Relation Age of Onset   Hypertension Mother    Lung cancer Mother 9   Melanoma Mother 13 - 63       dx twice   Scoliosis Father    Cirrhosis Father        hep B   Healthy Brother    Colon cancer Maternal Grandmother    Brain cancer Maternal Grandfather        glioblastoma   Melanoma Daughter 21       x3 - under thumbnail, under breast, vulva    Social History:  reports that she quit smoking about 7 years ago. Her smoking use included cigarettes. She has never used smokeless tobacco. She reports that she does not drink alcohol and does not use drugs.  Allergies:  Allergies  Allergen Reactions   Amoxicillin  Anaphylaxis    Voice changes and feels like throat is swelling and rash   Other Anaphylaxis   Astelin Other (See Comments)    Medications: I have reviewed the patient's current medications.  No results found for this or any previous visit (from the past 48 hours).  No results found.  ROS Blood pressure 125/84, pulse 88, temperature 98.6 F (37 C), last menstrual period 04/02/2018, SpO2 98%. Physical Exam Constitutional:      Appearance: Normal appearance.  HENT:     Head: Normocephalic and atraumatic.     Right Ear: Tympanic membrane is without lesions and middle ear aerated, ear canal and external  ear normal.     Left Ear: Tympanic membrane is without lesions and middle ear aerated, ear canal and external ear normal.     Nose: Nose anterior nipple blood vessel on the septum left side. Turbinates with mild hypertrophy, No significant swelling or masses.     Oral cavity/oropharynx: Mucous membranes are moist. No lesions or masses    Larynx: normal voice. Mirror attempted without success    Eyes:     Extraocular Movements: Extraocular movements intact.     Conjunctiva/sclera: Conjunctivae normal.     Pupils: Pupils are equal, round, and reactive to light.  Cardiovascular:     Rate and Rhythm: Normal rate.  Pulmonary:     Effort: Pulmonary effort is normal.  Musculoskeletal:     Cervical back: Normal range of motion and neck supple. No rigidity.  Lymphadenopathy:     Cervical: No cervical adenopathy or masses.salivary glands without lesions. .  Neurological:     Mental Status: He is alert. CN 2-12 intact. No nystagmus  Epistaxis procedure We discussed risk, benefits, and options.  All questions were answered and consent was obtained.  The nose was anesthetized with lidocaine with Afrin.  It  was let sit for 5 minutes.  The area on the left anterior septum was cauterized with silver nitrate twice.  A suction was used to be sure to concentrate the cautery and allow proper cautery with no fluid.  There was no bleeding.  She tolerated the procedure well.    Assessment/Plan: Epistaxis-the left area was cauterized without difficulty.  It bled very little.  Hopefully this will now resolved.  She will use saline twice a day.  She will follow-up if there is any further bleeding.  Kelly Woodward Notice 06/13/2024, 10:00 AM

## 2024-06-19 ENCOUNTER — Emergency Department (HOSPITAL_COMMUNITY): Admission: EM | Admit: 2024-06-19 | Source: Home / Self Care

## 2024-06-19 ENCOUNTER — Other Ambulatory Visit: Payer: Self-pay

## 2024-06-19 ENCOUNTER — Encounter (HOSPITAL_COMMUNITY): Payer: Self-pay

## 2024-06-19 ENCOUNTER — Ambulatory Visit (INDEPENDENT_AMBULATORY_CARE_PROVIDER_SITE_OTHER): Admitting: Otolaryngology

## 2024-06-19 DIAGNOSIS — R040 Epistaxis: Secondary | ICD-10-CM | POA: Diagnosis not present

## 2024-06-19 NOTE — ED Notes (Signed)
 Pt gives verbal consent for mse

## 2024-06-19 NOTE — Progress Notes (Signed)
 Referring Physician: Dr. Micheal Grayce LITTIE Kelly Woodward is an 64 y.o. female.  HPI: Having nosebleeds again.  Left side is bleeding.  She had 2 bleeds after the cautery.  She is here acutely for control of epistaxis.  Past Medical History:  Diagnosis Date   Allergy     Rhinitis   Family history of glioblastoma    Family history of melanoma    GERD (gastroesophageal reflux disease)    History of nephrolithiasis 1996   Osteopenia    Peptic ulcer    need to avoid ASA per pt.   Tobacco abuse    Vision abnormalities     Past Surgical History:  Procedure Laterality Date   cyst on vocal chord  1998    Family History  Problem Relation Age of Onset   Hypertension Mother    Lung cancer Mother 53   Melanoma Mother 45 - 35       dx twice   Scoliosis Father    Cirrhosis Father        hep B   Healthy Brother    Colon cancer Maternal Grandmother    Brain cancer Maternal Grandfather        glioblastoma   Melanoma Daughter 21       x3 - under thumbnail, under breast, vulva    Social History:  reports that she quit smoking about 7 years ago. Her smoking use included cigarettes. She has never used smokeless tobacco. She reports that she does not drink alcohol and does not use drugs.  Allergies:  Allergies  Allergen Reactions   Amoxicillin  Anaphylaxis    Voice changes and feels like throat is swelling and rash   Other Anaphylaxis   Astelin Other (See Comments)    Medications: I have reviewed the patient's current medications.  No results found for this or any previous visit (from the past 48 hours).  No results found.  ROS Last menstrual period 04/02/2018. Physical Exam Constitutional:      Appearance: Normal appearance.  HENT:     Head: Normocephalic and atraumatic.     Right Ear: Tympanic membrane is without lesions and middle ear aerated, ear canal and external ear normal.     Left Ear: Tympanic membrane is without lesions and middle ear aerated, ear canal and external  ear normal.     Nose: Left side has packing in it that was removed.  She is actively bleeding briskly.  A Afrin/lidocaine pledget was placed.  Suction was used to remove the clot.  There was a pumping vessel on the inferior anterior septum.  It was anesthetized again with 2 more pledgets.  Silver nitrate was then used to cauterize the area multiple times and it kept bleeding around the cautery.  Finally after the fourth cautery it seemed to stop but still had a slight ooze so this Surgicel was placed and packed up against it..     Oral cavity/oropharynx: Mucous membranes are moist. No lesions or masses    Larynx: normal voice. Mirror attempted without success    Eyes:     Extraocular Movements: Extraocular movements intact.     Conjunctiva/sclera: Conjunctivae normal.     Pupils: Pupils are equal, round, and reactive to light.  Cardiovascular:     Rate and Rhythm: Normal rate.  Pulmonary:     Effort: Pulmonary effort is normal.  Musculoskeletal:     Cervical back: Normal range of motion and neck supple. No rigidity.  Lymphadenopathy:     Cervical:  No cervical adenopathy or masses.salivary glands without lesions. .  Neurological:     Mental Status: He is alert. CN 2-12 intact. No nystagmus      Assessment/Plan: Epistaxis-she had cautery and packing on a new pumping vessel site that was not where the previous cautery was located.  She tolerated this reasonably well and the bleeding did seem to be controlled.  She will continue her previous instructions and follow-up with there is further bleeding.  Kelly Woodward 06/19/2024, 3:25 PM

## 2024-06-19 NOTE — ED Triage Notes (Addendum)
 Pt reports she had vessel in nose cauterized last Wednesday, has had intermittent light bleeding since that quickly resolves. Here now with nose bleed that started 30 mins ago and heavier than it has been since the procedure.

## 2024-06-20 ENCOUNTER — Telehealth (INDEPENDENT_AMBULATORY_CARE_PROVIDER_SITE_OTHER): Payer: Self-pay

## 2024-06-20 NOTE — Telephone Encounter (Signed)
 Patient had called leaving a voicemail asking questions about her packing and if she can take a shower and wash her face.   I called patient back patient answered I explained to her that Dr. Roark stated it was fine to take a shower and wash her face just to not mess with the packing it will dissolve and come out on its own. Patient understood and thanked me for getting back to her quickly.

## 2024-06-22 ENCOUNTER — Telehealth (INDEPENDENT_AMBULATORY_CARE_PROVIDER_SITE_OTHER): Payer: Self-pay

## 2024-06-22 NOTE — Telephone Encounter (Signed)
 I spoke w/pt after she LVM asking about packing. I inform pt that Per Dr. Roark, packing is dissolvable. It will just take time. Pt understood.

## 2024-06-26 ENCOUNTER — Ambulatory Visit: Payer: Self-pay

## 2024-06-26 NOTE — Telephone Encounter (Signed)
 FYI Only or Action Required?: FYI only for provider: appointment scheduled on 06/27/2024 at 3 PM.  Patient was last seen in primary care on 04/19/2024 by Ozell Heron HERO, MD.  Called Nurse Triage reporting Fatigue.  Symptoms began a week ago.  Interventions attempted: Rest, hydration, or home remedies.  Symptoms are: unchanged.  Triage Disposition: See Physician Within 24 Hours  Patient/caregiver understands and will follow disposition?: Yes  Copied from CRM #[id number]. Topic: Clinical - Red Word Triage >> Jun 26, 2024 11:50 AM Mia F wrote: Red Word that prompted transfer to Nurse Triage: Had an emergency situation where she lost over a pint of blood by a nosebleed about a week ago. She is now very tired and having brain fog. She think it could be getting worse because she is forcing herself. She is also experincing light headedness. She says she went to the ER when the bleeding happened but was told to cancel the visit and go to ENT which she did and they stopped the bleeding Reason for Disposition  [1] MODERATE weakness (e.g., interferes with work, school, normal activities) AND [2] persists > 3 days  Answer Assessment - Initial Assessment Questions Patient reports she had a significant nose last Tuesday. Patient did call ENT and had to leave a message. Patient went to the ED but was called by ENT shortly. Patient was able to go to ENT and get the area cauterized. States ENT thought she bleed significantly until she was cauterized. Patient reports no further bleeding since she was seen in ENT.  Patient reports she has been feeling very fatigued since this incident happened. Requesting to be seen in the office to get blood work checked. Scheduled to see PCP tomorrow at 3:00 PM. 1. DESCRIPTION: Describe how you are feeling.     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 SEVERITY: How bad is it?  Can you stand and walk?     moderate 3. ONSET:  When did these symptoms begin? (e.g., hours, days, weeks, months)     Started on Tuesday with patient's nosebleed 4. CAUSE: What do you think is causing the weakness or fatigue? (e.g., not drinking enough fluids, medical problem, trouble sleeping)     Medical problem. Patient developed a severe nosebleed on Tuesday.  5. NEW MEDICINES:  Have you started on any new medicines recently? (e.g., opioid pain medicines, benzodiazepines, muscle relaxants, antidepressants, antihistamines, neuroleptics, beta blockers)     No new medication 6. OTHER SYMPTOMS: Do you have any other symptoms? (e.g., chest pain, fever, cough, SOB, vomiting, diarrhea, bleeding, other areas of pain)     Brain fog-patient states she doesn't feel 100%  Protocols used: Weakness (Generalized) and Fatigue-A-AH

## 2024-06-27 ENCOUNTER — Ambulatory Visit: Admitting: Family Medicine

## 2024-06-27 ENCOUNTER — Encounter: Payer: Self-pay | Admitting: Family Medicine

## 2024-06-27 VITALS — BP 126/70 | HR 93 | Temp 98.1°F | Wt 176.6 lb

## 2024-06-27 DIAGNOSIS — R41840 Attention and concentration deficit: Secondary | ICD-10-CM

## 2024-06-27 DIAGNOSIS — R5383 Other fatigue: Secondary | ICD-10-CM

## 2024-06-27 DIAGNOSIS — F5104 Psychophysiologic insomnia: Secondary | ICD-10-CM

## 2024-06-27 MED ORDER — LORAZEPAM 0.5 MG PO TABS: 0.5000 mg | ORAL_TABLET | Freq: Every evening | ORAL | 0 refills | Status: AC | PRN

## 2024-06-27 NOTE — Progress Notes (Unsigned)
 Established Patient Office Visit  Subjective   Patient ID: Kelly Woodward, female    DOB: [birth year]  Age: 63 y.o. MRN: [id number]  Chief Complaint  Patient presents with   Fatigue    HPI  {History (Optional):23778} Kelly Woodward is seen today for several issues as follows  She has had excessive fatigue especially past week.  She wonders if this may be related to recent blood loss from nosebleed.  She had recurrent epistaxis left nose and saw ENT a few times.  Most rapid bleed was a week ago Tuesday which prompted ER visit.  While she was waiting to be seen in the ER ENT office called and she was transitioned over their office.  She had another area of the nose it was cauterized and packing was applied which stayed in until Saturday.  After she sneezed on Saturday and packing came out she has had no further nosebleeds.  She has had little bit of lightheadedness.  No syncope.  No dyspnea.  She is having fatigue and she wonders if some of this might be related to poor sleep quality as well.  Her job is very busy this time a year.  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  She has noted especially fatigue at the end of the day.  She has concerns that she may have ADD.  She has had difficulty focusing her adult life.  She is having increasing difficulties finding words at times.  Has not had any formal testing for ADD but is inquiring about possible medication options if she has this.  Her mother did die about a year ago and she has had difficulties with them with that at times.  She has had some low motivation frequently and difficulty organizing.  Past Medical History:  Diagnosis Date   Allergy     Rhinitis   Family history of glioblastoma    Family history of melanoma    GERD (gastroesophageal reflux disease)    History of nephrolithiasis 1996   Osteopenia    Peptic ulcer    need to avoid ASA per pt.   Tobacco abuse    Vision abnormalities    Past  Surgical History:  Procedure Laterality Date   cyst on vocal chord  1998    reports that she quit smoking about 7 years ago. Her smoking use included cigarettes. She has never used smokeless tobacco. She reports that she does not drink alcohol and does not use drugs. family history includes Brain cancer in her maternal grandfather; Cirrhosis in her father; Colon cancer in her maternal grandmother; Healthy in her brother; Hypertension in her mother; Lung cancer (age of onset: 17) in her mother; Melanoma (age of onset: 9) in her daughter; Melanoma (age of onset: 33 - 22) in her mother; Scoliosis in her father. Allergies  Allergen Reactions   Amoxicillin  Anaphylaxis    Voice changes and feels like throat is swelling and rash   Other Anaphylaxis   Astelin Other (See Comments)    Review of Systems  Constitutional:  Positive for malaise/fatigue. Negative for chills, fever and weight loss.  Respiratory:  Negative for cough and shortness of breath.   Cardiovascular:  Negative for chest pain.  Genitourinary:  Negative for dysuria.  Neurological:  Negative for dizziness and focal weakness.      Objective:     BP 126/70   Pulse 93   Temp 98.1 F (36.7 C) (Oral)   Wt 176  lb 9.6 oz (80.1 kg)   LMP 04/02/2018   SpO2 99%   BMI 26.08 kg/m  {Vitals History (Optional):23777}  Physical Exam Vitals reviewed.  Constitutional:      General: She is not in acute distress.    Appearance: She is well-developed. She is not ill-appearing.  Eyes:     Pupils: Pupils are equal, round, and reactive to light.  Neck:     Thyroid : No thyromegaly.     Vascular: No JVD.  Cardiovascular:     Rate and Rhythm: Normal rate and regular rhythm.     Heart sounds:     No gallop.  Pulmonary:     Effort: Pulmonary effort is normal. No respiratory distress.     Breath sounds: Normal breath sounds. No wheezing or rales.  Musculoskeletal:     Cervical back: Neck supple.     Right lower leg: No edema.      Left lower leg: No edema.  Neurological:     General: No focal deficit present.     Mental Status: She is alert.     Cranial Nerves: No cranial nerve deficit.      No results found for any visits on 06/27/24.  {Labs (Optional):23779}  The 10-year ASCVD risk score (Arnett DK, et al., 2019) is: 4.2%    Assessment & Plan:   #1 fatigue.  Etiology unclear.  Clinically, doubt significant anemia from recent epistaxis but will check CBC to be sure.  She is also had prior history of B12 deficiency.  Will check B12 level along with TSH.  Possibly related to increased stress and poor sleep quality  #2 chronic intermittent insomnia.  She has used low-dose lorazepam  intermittently for years.  She uses this very infrequently.  Refill given lorazepam  0.5 mg she knows uses sparingly  #3 concern for possible ADD.  Patient would like to clarify.  We gave her the name and number of our behavioral health division to consider setting up screening   No follow-ups on file.    Wolm Scarlet, MD

## 2024-06-28 ENCOUNTER — Ambulatory Visit: Payer: Self-pay | Admitting: Family Medicine

## 2024-06-28 LAB — CBC WITH DIFFERENTIAL/PLATELET
Basophils Absolute: 0.1 K/uL (ref 0.0–0.1)
Basophils Relative: 0.8 % (ref 0.0–3.0)
Eosinophils Absolute: 0.1 K/uL (ref 0.0–0.7)
Eosinophils Relative: 1.2 % (ref 0.0–5.0)
HCT: 37.9 % (ref 36.0–46.0)
Hemoglobin: 13 g/dL (ref 12.0–15.0)
Lymphocytes Relative: 15.5 % (ref 12.0–46.0)
Lymphs Abs: 1.4 K/uL (ref 0.7–4.0)
MCHC: 34.3 g/dL (ref 30.0–36.0)
MCV: 92.2 fl (ref 78.0–100.0)
Monocytes Absolute: 0.5 K/uL (ref 0.1–1.0)
Monocytes Relative: 5.6 % (ref 3.0–12.0)
Neutro Abs: 6.8 K/uL (ref 1.4–7.7)
Neutrophils Relative %: 76.9 % (ref 43.0–77.0)
Platelets: 363 K/uL (ref 150.0–400.0)
RBC: 4.11 Mil/uL (ref 3.87–5.11)
RDW: 13.4 % (ref 11.5–15.5)
WBC: 8.8 K/uL (ref 4.0–10.5)

## 2024-06-28 LAB — VITAMIN B12: Vitamin B-12: 302 pg/mL (ref 211–911)

## 2024-06-28 LAB — TSH: TSH: 1.75 u[IU]/mL (ref 0.35–5.50)
